# Patient Record
Sex: Female | Born: 1965 | Race: White | Hispanic: No | Marital: Married | Smoking: Former smoker
Health system: Southern US, Community
[De-identification: ages and names within clinical notes are randomized; demographics above are authoritative.]

## PROBLEM LIST (undated history)

## (undated) DIAGNOSIS — M797 Fibromyalgia: Secondary | ICD-10-CM

## (undated) DIAGNOSIS — I4891 Unspecified atrial fibrillation: Secondary | ICD-10-CM

## (undated) DIAGNOSIS — T7840XA Allergy, unspecified, initial encounter: Secondary | ICD-10-CM

## (undated) DIAGNOSIS — G90A Postural orthostatic tachycardia syndrome (POTS): Secondary | ICD-10-CM

## (undated) DIAGNOSIS — K5792 Diverticulitis of intestine, part unspecified, without perforation or abscess without bleeding: Secondary | ICD-10-CM

## (undated) DIAGNOSIS — I498 Other specified cardiac arrhythmias: Secondary | ICD-10-CM

## (undated) HISTORY — DX: Postural orthostatic tachycardia syndrome (POTS): G90.A

## (undated) HISTORY — PX: FRACTURE SURGERY: SHX138

## (undated) HISTORY — DX: Other specified cardiac arrhythmias: I49.8

## (undated) HISTORY — DX: Unspecified atrial fibrillation: I48.91

## (undated) HISTORY — PX: ABDOMINAL HYSTERECTOMY: SHX81

## (undated) HISTORY — DX: Fibromyalgia: M79.7

## (undated) HISTORY — PX: TUBAL LIGATION: SHX77

## (undated) HISTORY — DX: Allergy, unspecified, initial encounter: T78.40XA

## (undated) HISTORY — PX: FINGER SURGERY: SHX640

---

## 1998-09-23 ENCOUNTER — Emergency Department (HOSPITAL_COMMUNITY): Admission: EM | Admit: 1998-09-23 | Discharge: 1998-09-23 | Payer: Self-pay | Admitting: Emergency Medicine

## 1998-11-23 ENCOUNTER — Encounter: Payer: Self-pay | Admitting: Orthopedic Surgery

## 1998-11-23 ENCOUNTER — Ambulatory Visit (HOSPITAL_COMMUNITY): Admission: RE | Admit: 1998-11-23 | Discharge: 1998-11-23 | Payer: Self-pay | Admitting: Orthopedic Surgery

## 1999-01-29 ENCOUNTER — Other Ambulatory Visit: Admission: RE | Admit: 1999-01-29 | Discharge: 1999-01-29 | Payer: Self-pay | Admitting: *Deleted

## 1999-07-20 ENCOUNTER — Ambulatory Visit (HOSPITAL_COMMUNITY): Admission: RE | Admit: 1999-07-20 | Discharge: 1999-07-20 | Payer: Self-pay | Admitting: Orthopedic Surgery

## 1999-07-20 ENCOUNTER — Encounter: Payer: Self-pay | Admitting: Orthopedic Surgery

## 1999-09-27 ENCOUNTER — Encounter: Payer: Self-pay | Admitting: Obstetrics and Gynecology

## 1999-09-27 ENCOUNTER — Ambulatory Visit (HOSPITAL_COMMUNITY): Admission: AD | Admit: 1999-09-27 | Discharge: 1999-09-27 | Payer: Self-pay | Admitting: Obstetrics and Gynecology

## 1999-09-27 ENCOUNTER — Encounter (INDEPENDENT_AMBULATORY_CARE_PROVIDER_SITE_OTHER): Payer: Self-pay

## 2000-02-05 ENCOUNTER — Other Ambulatory Visit: Admission: RE | Admit: 2000-02-05 | Discharge: 2000-02-05 | Payer: Self-pay | Admitting: Obstetrics and Gynecology

## 2001-06-22 ENCOUNTER — Other Ambulatory Visit: Admission: RE | Admit: 2001-06-22 | Discharge: 2001-06-22 | Payer: Self-pay | Admitting: Obstetrics and Gynecology

## 2002-08-04 ENCOUNTER — Other Ambulatory Visit: Admission: RE | Admit: 2002-08-04 | Discharge: 2002-08-04 | Payer: Self-pay | Admitting: Obstetrics and Gynecology

## 2002-09-10 ENCOUNTER — Ambulatory Visit (HOSPITAL_COMMUNITY): Admission: RE | Admit: 2002-09-10 | Discharge: 2002-09-10 | Payer: Self-pay | Admitting: Obstetrics and Gynecology

## 2002-09-10 ENCOUNTER — Encounter (INDEPENDENT_AMBULATORY_CARE_PROVIDER_SITE_OTHER): Payer: Self-pay

## 2002-09-15 ENCOUNTER — Ambulatory Visit (HOSPITAL_COMMUNITY): Admission: RE | Admit: 2002-09-15 | Discharge: 2002-09-15 | Payer: Self-pay | Admitting: Obstetrics and Gynecology

## 2002-09-15 ENCOUNTER — Encounter: Payer: Self-pay | Admitting: Obstetrics and Gynecology

## 2006-04-16 ENCOUNTER — Encounter: Payer: Self-pay | Admitting: Emergency Medicine

## 2006-09-07 ENCOUNTER — Encounter (INDEPENDENT_AMBULATORY_CARE_PROVIDER_SITE_OTHER): Payer: Self-pay | Admitting: *Deleted

## 2006-09-08 ENCOUNTER — Ambulatory Visit (HOSPITAL_COMMUNITY): Admission: RE | Admit: 2006-09-08 | Discharge: 2006-09-10 | Payer: Self-pay | Admitting: Obstetrics and Gynecology

## 2006-10-31 ENCOUNTER — Encounter: Admission: RE | Admit: 2006-10-31 | Discharge: 2006-10-31 | Payer: Self-pay | Admitting: Obstetrics and Gynecology

## 2007-11-20 ENCOUNTER — Encounter (INDEPENDENT_AMBULATORY_CARE_PROVIDER_SITE_OTHER): Payer: Self-pay | Admitting: Obstetrics and Gynecology

## 2007-11-20 ENCOUNTER — Ambulatory Visit (HOSPITAL_COMMUNITY): Admission: RE | Admit: 2007-11-20 | Discharge: 2007-11-20 | Payer: Self-pay | Admitting: Obstetrics and Gynecology

## 2008-06-29 ENCOUNTER — Inpatient Hospital Stay (HOSPITAL_COMMUNITY): Admission: RE | Admit: 2008-06-29 | Discharge: 2008-07-01 | Payer: Self-pay | Admitting: Obstetrics and Gynecology

## 2008-06-29 ENCOUNTER — Encounter (INDEPENDENT_AMBULATORY_CARE_PROVIDER_SITE_OTHER): Payer: Self-pay | Admitting: Obstetrics and Gynecology

## 2011-04-02 ENCOUNTER — Other Ambulatory Visit: Payer: Self-pay | Admitting: Ophthalmology

## 2011-04-02 DIAGNOSIS — R51 Headache: Secondary | ICD-10-CM

## 2011-04-03 ENCOUNTER — Ambulatory Visit
Admission: RE | Admit: 2011-04-03 | Discharge: 2011-04-03 | Disposition: A | Discharge status: Home / Self Care | Payer: 59 | Source: Ambulatory Visit | Attending: Ophthalmology | Admitting: Ophthalmology

## 2011-04-03 DIAGNOSIS — R51 Headache: Secondary | ICD-10-CM

## 2011-04-03 MED ORDER — GADOBENATE DIMEGLUMINE 529 MG/ML IV SOLN
20.0000 mL | Freq: Once | INTRAVENOUS | Status: AC | PRN
  Administered 2011-04-03: 20 mL via INTRAVENOUS

## 2011-04-30 ENCOUNTER — Other Ambulatory Visit: Payer: Self-pay | Admitting: Neurology

## 2011-04-30 DIAGNOSIS — G932 Benign intracranial hypertension: Secondary | ICD-10-CM

## 2011-04-30 NOTE — Op Note (Signed)
NAME:  Christine [name], Christine [name]            ACCOUNT NO.:  [id number]   MEDICAL RECORD NO.:  [id number]          PATIENT TYPE:  INP   LOCATION:  9302                          FACILITY:  [facility]   PHYSICIAN:  Juluis Mire, M.D.   DATE OF BIRTH:  07/21/1966   DATE OF PROCEDURE:  06/29/2008  DATE OF DISCHARGE:                               OPERATIVE REPORT   ADMITTING DIAGNOSES:  1. Cystic enlargement of the left ovary, apparent serous cystadenoma.  2. Pelvic adhesions with associated pelvic pain.   POSTOPERATIVE DIAGNOSES:  1. Cystic enlargement of the left ovary, apparent serous cystadenoma.  2. Pelvic adhesions with associated pelvic pain.   OPERATIVE PROCEDURES:  1. Exploratory laparotomy.  2. Lysis of adhesions.  3. Bilateral salpingo-oophorectomy.  4. Cystoscopy.   SURGEON:  Juluis Mire, MD   ASSISTANT:  Zelphia Cairo, MD   ESTIMATED BLOOD LOSS:  200 mL.   PACKS AND DRAINS:  None.   INTRAOPERATIVE BLOOD PLACED:  None.   COMPLICATIONS:  None.   INDICATIONS:  As noted in the history and physical.   PROCEDURES NOTE:  The patient was taken to the OR and placed in supine  position.  After satisfactory level of general endotracheal anesthesia  was obtained, the abdomen was prepped out with Betadine and draped in  sterile field.  A Foley had been placed to straight drain.  A low  transverse skin incision was made with knife and carried through the  subcutaneous tissue.  Fascia was entered sharply and an incision in the  fascia was done laterally.  Fascia was taken off the muscle superiorly  and inferiorly.  Rectus muscles were separated midline.  Peritoneum was  entered sharply and incision in the peritoneum was extended both  superiorly and inferiorly.  It was noted that the sigmoid colon was  adherent to the anterior abdominal wall.  This was taken down using  sharp dissection with no apparent injury to the colon.  At this point in  time, we could see a cystically enlarged  left ovary.  The bowel was also  adherent to that.  This was sharply taken down.  During the procedure,  the cyst was entered.  Clear fluid was obtained.  The cyst lining was  smooth with no evidence of excrescences or solid areas.  At this point  in time, we went about trying to find the ureter.  We identified a  structure on the left side that appeared to be ureter and had been  disrupted.  At this point in time, we called in the urologist.  We went  ahead and decided to remove the right ovary.  At this point in time, the  right ovary was identified and elevated.  We made incision in the  peritoneum over the psoas muscle.  We developed a right retroperitoneal  space.  The ureter was easily identified.  The ovarian vasculature was  isolated above the ureter, clamped and cut, and the ovary and tube were  passed off the operative field and sent to Pathology.  The pedicles were  secured with a free tie of 0 Vicryl  and a suture ligature of 0 Vicryl.  We went back to the left side.  After evaluation, we decided that the  structure we felt as ureter was probably the round ligament.  We went up  a little higher on the peritoneal sidewall and did identify the ureter  at this point in time before the urologist came into place.  At this  point in time, we identified the ovarian vasculature above the ureter,  clamped, cut, and removed the left tube and ovary from the operative  field and it was sent to Pathology.  Pedicles were secured with a free  tie of 0 Vicryl and suture ligature of 0 Vicryl.  At this point in time,  Dr. Patsi Sears came in placed.  He identified and traced out the ureter,  felt it was uninvolved in the surgical procedure.  At this point, we  irrigated the pelvis, had good hemostasis, clear urine output.  The  patient had been given indigo carmine.  At this point in time, muscles  and peritoneum were closed with running suture of 3-0 Vicryl, fascia was  closed with running suture  of 0 PDS, and skin was closed with staples  and Steri-Strips.   The patient's legs were repositioned, and Foley was removed.  Cystoscope  was put in place.  Visualization of both ureteral orifices revealed  spilling of blue-tinged urine indicating no obstruction and there was no  bladder abnormalities.  Cystoscope was removed.  Foley was placed back  to straight drain.  At this point in time, the patient was extubated and  was transferred to recovery room in good condition.  Sponge,  instruments, and needle count was correct by circulating nurse x2.      Juluis Mire, M.D.  Electronically Signed     JSM/MEDQ  D:  06/30/2008  T:  06/30/2008  Job:  [id number]

## 2011-04-30 NOTE — Consult Note (Signed)
NAME:  BRITTYN, SALAZ            ACCOUNT NO.:  [id number]   MEDICAL RECORD NO.:  [id number]          PATIENT TYPE:  INP   LOCATION:  9302                          FACILITY:  [facility]   PHYSICIAN:  Sigmund I. Patsi Sears, M.D.DATE OF BIRTH:  1966/03/31   DATE OF CONSULTATION:  DATE OF DISCHARGE:                                 CONSULTATION   SUBJECTIVE:  Ms. Taflinger is a 45 year old female, who is  intraoperative TAH-BSO.  However, the left ureter could not be easily  identified.  Structures were identified and ligated during the  procedure, but the structure, thought possibly be ureter, is the round  ligament.  The question now is whether the ureter is in the inflammatory  mass around the left ovary.   PROCEDURE:  Dissection was identified retroperitoneally, until the  ureter was identified, and dissected down around the inflammatory mass.  It was not within the operative ovarian mass.   The patient will be given indigo carmine, cystoscope by Dr. Arelia Sneddon.  I  do not anticipate any further problems for Ms. Stawicki.  Please  reconsult as needed.   IMPRESSION:  Inflammatory mass to left pelvis.  Left ureter is  identified, dissected away from the mass.      Sigmund I. Patsi Sears, M.D.  Electronically Signed     SIT/MEDQ  D:  06/29/2008  T:  06/29/2008  Job:  [id number]   cc:   Juluis Mire, M.D.  Fax: [phone]

## 2011-04-30 NOTE — Op Note (Signed)
NAME:  Christine [name], Christine [name]            ACCOUNT NO.:  [id number]   MEDICAL RECORD NO.:  [id number]          PATIENT TYPE:  AMB   LOCATION:  SDC                           FACILITY:  [facility]   PHYSICIAN:  Juluis Mire, M.D.   DATE OF BIRTH:  11-26-1966   DATE OF PROCEDURE:  11/20/2007  DATE OF DISCHARGE:                               OPERATIVE REPORT   PREOPERATIVE DIAGNOSES:  Pelvic pain with cystic enlargement of the left  adnexa.   POSTOPERATIVE DIAGNOSES:  1. Pelvic pain with cystic enlargement of the left adnexa.  2. Extensive abdominal pelvic adhesions.   PROCEDURES:  Open laparoscopy, lysis of adhesions left ovarian  cystotomy.   SURGEON:  Juluis Mire, M.D.   ANESTHESIA:  General.   ESTIMATED BLOOD LOSS:  Minimal.   PACKS AND DRAINS:  None.   BLOOD REPLACED:  None.   COMPLICATIONS:  None.   INDICATIONS:  Dictated in history and physical.   DESCRIPTION OF PROCEDURE:  The patient was taken to the OR, placed in  supine position.  After a satisfactory level of general endotracheal  anesthesia was obtained, the patient was placed in the dorsal lithotomy  position using the Allen stirrups.  Dr. Logan Bores first performed  cystoscopy. After he was done, a Foley was placed to straight drain.  The abdomen and perineum were prepped out with Betadine and draped in a  sterile field. A subumbilical incision made with a knife and carried  through the subcutaneous tissue. The fascia was entered sharply and  incision in the fascia extended laterally. The muscles were separated.  We entered into the peritoneum with blunt pressure and open laparoscopic  trocar was put in place and secured.  The laparoscope was introduced.  She had omental adhesions to the anterior abdominal wall.  We were able  to put in a 5-mm trocar in the left lower quadrant after visualizing the  epigastric vessels.  At this point in time, the omental adhesions that  were in the periumbilical artery were taken down  using the gyrus. There  is no bowel encountered in this. We were then able to see the right  ovary which appeared normal.  A 5-mm trocar port was put in place in the  suprapubic area under direct visualization. The left ovary had  approximately a 5-6 cm simple-appearing cyst with smooth wall and the  descending sigmoid colon was adherent around this.  It was also adherent  to the anterior abdominal wall.  The cul-de-sac was otherwise clear, so  the colon came down and evidently stuck up to the anterior abdominal  wall therefore entrapping the left ovary. We could not safely get to the  ovary completely because of the colon. Therefore we made a cystotomy  incision in the cyst. We drained this and we did obtain a biopsy from  the wall that was sent to pathology. Also of note, we did obtain pelvic  washings.  We thoroughly irrigated the pelvis at this point. We had good  hemostasis. There was no evidence of injury to the intestine or bladder.  The abdomen was deflated of carbon  dioxide, all trocars removed.  The  subumbilical fascia was closed with figure-of-eight of #0 Vicryl, skin  with interrupted subcuticulars with 4-0 Vicryl.  The suprapubic incision  was closed with Dermabond. The Foley was removed.  The patient was taken  out of the dorsal lithotomy position once alert, extubated and  transferred to recovery in good condition.  Sponge, instrument and  needle count reported as correct by circulating nurse x2.      Juluis Mire, M.D.  Electronically Signed     JSM/MEDQ  D:  11/20/2007  T:  11/20/2007  Job:  [id number]

## 2011-04-30 NOTE — Discharge Summary (Signed)
NAME:  [name], Christine            ACCOUNT NO.:  [id number]   MEDICAL RECORD NO.:  [id number]          PATIENT TYPE:  INP   LOCATION:  9302                          FACILITY:  [facility]   PHYSICIAN:  Juluis Mire, M.D.   DATE OF BIRTH:  November 02, 1966   DATE OF ADMISSION:  06/29/2008  DATE OF DISCHARGE:  07/01/2008                               DISCHARGE SUMMARY   ADMITTING DIAGNOSES:  1. Cystic enlargement of the left ovary.  2. Abdominal and pelvic adhesions.   DISCHARGE DIAGNOSES:  1. Cystic enlargement of the left ovary.  2. Abdominal and pelvic adhesions.   OPERATIVE PROCEDURE:  Exploratory surgery with bilateral salpingo-  oophorectomy.   Urological consult determined course of ureter.  For complete history  and physical, please see dictated note.   COURSE IN THE HOSPITAL:  The patient underwent exploratory surgery, had  extensive abdominal and pelvic adhesions.  After lysis of adhesions, we  were able to form bilateral salpingo-oophorectomy.  We did get an  intraoperative consult with Dr. Patsi Sears who evaluated the ureter and  felt that was undamaged.  Subsequent cystoscopy confirmed that.  Postop,  the patient did well.  Postop hemoglobin was 13.1.  Discharged home on  her second postop day.  At that time was afebrile, stable vital signs.  She was tolerating a regular diet and ambulating without difficulty.  She was voiding without difficulty.  In terms of exam, she was afebrile,  stable vital signs.  Her incision was clear.  We did leave the staples  in place.  Pathology from both ovaries was unremarkable.  In terms of  exam, abdomen was soft and nontender.  Bowel sounds were active.  She  was passing flatus.  Voiding without difficulty.   In terms of complications, none were encountered during her stay in the  hospital.  The patient was discharged to home in stable condition.   DISPOSITION:  Routine postop instructions were given.  She is to avoid  heavy lifting, vaginal  entrance, or driving a car.  She will watch for  signs of infection, nausea, vomiting, or increased abdominal pain.  Also, instructed signs of deep venous thrombosis or pulmonary embolus.  Discharged home on Tylox as needed for pain.  Follow up early next week  to remove staples.      Juluis Mire, M.D.  Electronically Signed     JSM/MEDQ  D:  07/01/2008  T:  07/01/2008  Job:  [id number]

## 2011-04-30 NOTE — H&P (Signed)
NAME:  Christine [name], Christine [name] NO.:  [id number]   MEDICAL RECORD NO.:  [id number]          PATIENT TYPE:  AMB   LOCATION:  SDC                           FACILITY:  [facility]   PHYSICIAN:  Juluis Mire, M.D.   DATE OF BIRTH:  11/06/1966   DATE OF ADMISSION:  DATE OF DISCHARGE:                              HISTORY & PHYSICAL   HISTORY OF PRESENT ILLNESS:  Patient is a 45 year old gravida 2, para 1,  abortive 1 female who presents for a laparoscopic evaluation.   Patient in 2007 had a total abdominal hysterectomy due to pelvic  adhesions.  She continued to experience pelvic pain and discomfort,  particularly with intercourse.  She has had an ultrasound evaluated,  revealed persistent cystic enlargement of the left ovary.  She is now  going to proceed with laparoscopic evaluation of the pelvis to determine  if there are any adhesions and management of the persistent left ovarian  cyst.  Of note, CA-125 was negative.   ALLERGIES:  SHE IS ALLERGIC TO PENICILLIN AND SULFA.   MEDICATIONS:  Imitrex, simvastatin, and Flexeril.   PAST MEDICAL HISTORY:  Usual childhood diseases.  Does have a history of  elevated cholesterol.   PAST SURGICAL HISTORY:  She had a previous laparoscopic evaluation,  lysis of adhesions, and tubal.  She had a previous hysterectomy.  She  has had 1 prior cesarean section, 1 vaginal delivery.   FAMILY HISTORY:  Noncontributory.   SOCIAL HISTORY:  Reveals no tobacco or alcohol use.   REVIEW OF SYSTEMS:  Noncontributory.   PHYSICAL EXAM:  Patient is afebrile with stable vital signs.  HEENT EXAM:  Patient normocephalic.  Pupils:  Equal, round, and reactive  to light and accommodation.  Extraocular movements were intact.  Sclerae  and conjunctivae are clear.  Oropharynx:  Clear.  Neck without thyromegaly.  BREASTS:  No discreet masses.  LUNGS:  Clear.  CARDIOVASCULAR SYSTEM:  Regular rhythm and rate without murmurs or  gallops.  Her abdominal exam is  benign.  Well-healed low transverse incision.  On pelvic, normal external genitalia.  Vaginal mucosa is clear.  Cuff  intact.  Left-sided tenderness and fullness.  EXTREMITIES:  Trace edema.  Neurologic exam is grossly within normal limits.   IMPRESSION:  1. Pelvic pain and dyspareunia, possibly secondary to adhesions.  2. Persistent left ovarian cyst.   PLAN:  The patient will undergo open laparoscopy with lysis of  adhesions, possible removal of left ovary or left cyst.  Risks of  surgery have been discussed including the risk of infection, the risk of  hemorrhage, could require transfusion with the risk of AIDS or  Hepatitis, the risk of injury to adjacent organs including bladder,  bowel, or ureter, could require further exploratory surgery, risk of  deep vein thrombosis and pulmonary embolus.  Patient expressed  understanding.      Juluis Mire, M.D.  Electronically Signed     JSM/MEDQ  D:  11/20/2007  T:  11/20/2007  Job:  [id number]

## 2011-04-30 NOTE — H&P (Signed)
NAME:  Christine [name], Christine [name] NO.:  [id number]   MEDICAL RECORD NO.:  [id number]          PATIENT TYPE:  AMB   LOCATION:  SDC                           FACILITY:  [facility]   PHYSICIAN:  Juluis Mire, M.D.   DATE OF BIRTH:  05/31/1966   DATE OF ADMISSION:  DATE OF DISCHARGE:                              HISTORY & PHYSICAL   The patient is a 45 year old gravida 2, para 1, abortus 1 female, who  presents for exploratory surgery, lysis of adhesions, and bilateral  salpingo-oophorectomy.   In relation to the present admission, the patient has a history of  pelvic adhesions.  She underwent a total abdominal hysterectomy in 2007.  Extensive adhesions were noted at that time.  After surgery, she began  experiencing recurrent pelvic pain and pain with intercourse.  Ultrasound had revealed persistent cystic enlargement of the left  adnexa.  She had undergone GI as well as urological workup with negative  findings.  Subsequently in December 2008, she underwent laparoscopy.  The sigmoid colon was densely adherent to the anterior abdominal wall.  We could see the cystic enlargement of the left ovary, but could not get  to it due to the colon.  We did do a partial cystectomy.  Pathology was  consistent with a benign serous cystadenoma.  However, the cyst has  reoccurred on followup ultrasound, and pain has become progressively  worse.  Her last ultrasound showed a simple cyst measuring 7 x 2.5 cm.  After discussion of the options, the patient wishes to proceed with  again exploratory surgery.  We will have to take down adhesions and  remove the left ovary.  She wants the right ovary removed also at this  time.  We have discussed leaving the right ovary, but she is adamant  about removing it.  We discussed that this would put her on menopause  and could require estrogen replacement therapy for management of hot  flashes.  The risk and benefits of hormones have been discussed.   ALLERGIES:  In terms of allergies, she is allergic to PENICILLIN and  SULFA drugs.   MEDICATIONS:  She takes Atarax 2 at night, Vi-Uril as needed, Bentyl as  needed, Flexeril 2 a day, Maxalt as needed, Phenergan as needed,  ketoconazole as needed, MetroGel once a day, and Lyrica three times a  day.   PAST MEDICAL HISTORY:  She has a history of migraine headaches, which  are being actively managed, as well as irritable bowel syndrome.  Her  family doctor is Dr. Sherryll Burger.   PAST SURGICAL HISTORY:  She has had a C-section.  She has had two hand  surgeries.  Tubal ligation.  Total abdominal hysterectomy as noted.  Laparoscopy as noted.   FAMILY HISTORY:  Noncontributory.   SOCIAL HISTORY:  Reveals no tobacco or alcohol use.   REVIEW OF SYSTEMS:  Noncontributory.   PHYSICAL EXAMINATION:  GENERAL:  The patient is afebrile.  VITAL SIGNS:  Stable.  HEENT:  The patient is normocephalic.  Pupils equal, round, and reactive  to light and accommodation.  Extraocular movements are intact.  Sclerae  and conjunctivae are clear.  Oropharynx clear.  NECK:  Without thyromegaly.  BREASTS:  No discrete masses.  LUNGS:  Clear.  CARDIAC:  Regular rate without murmurs or gallops.  ABDOMEN:  Benign.  No mass, organomegaly, or tenderness.  Well-healed  low-transverse incision.  PELVIC:  Normal external genitalia.  Vaginal mucosa is clear.  Cuff  intact.  Left-sided fullness.  Right side adnexa is unremarkable.  EXTREMITIES:  Trace edema.  NEURO:  Grossly within limits.   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   PLAN:  The patient will undergo exploratory surgery with bilateral  salpingo-oophorectomy.  It is noted that the risks of surgery were  explained including the risk of infection.  Risk of hemorrhage could  require transfusion with the risk of AIDS or hepatitis.  Risk of injury  to adjacent organs including bladder, bowel, or ureters that could  require further  exploratory surgery.  Risk of deep venous thrombosis and  pulmonary emboli.  She has been on a bowel prep.  She understands the  potential risks and complications of the surgery.      Juluis Mire, M.D.  Electronically Signed     JSM/MEDQ  D:  06/29/2008  T:  06/29/2008  Job:  [id number]

## 2011-05-03 ENCOUNTER — Ambulatory Visit
Admission: RE | Admit: 2011-05-03 | Discharge: 2011-05-03 | Disposition: A | Discharge status: Home / Self Care | Payer: 59 | Source: Ambulatory Visit | Attending: Neurology | Admitting: Neurology

## 2011-05-03 DIAGNOSIS — G932 Benign intracranial hypertension: Secondary | ICD-10-CM

## 2011-05-03 NOTE — Discharge Summary (Signed)
Christine [name], Christine [name]            ACCOUNT NO.:  [id number]   MEDICAL RECORD NO.:  [id number]          PATIENT TYPE:  INP   LOCATION:  9316                          FACILITY:  [facility]   PHYSICIAN:  Juluis Mire, M.D.   DATE OF BIRTH:  05-18-1966   DATE OF ADMISSION:  09/08/2006  DATE OF DISCHARGE:  09/10/2006                                 DISCHARGE SUMMARY   ADMITTING DIAGNOSIS:  Pelvic adhesions.   POSTOPERATIVE DIAGNOSIS:  Pelvic adhesions.   OPERATIVE PROCEDURE:  1. Exploratory laparoscopy.  2. Lysis of adhesions.  3. Total abdominal hysterectomy.   For complete history and physical, please see dictated note.   COURSE IN THE HOSPITAL:  The patient underwent the above-noted surgery.  Pathology is still pending.  Her postop hemoglobin was 12.  She remained  stable throughout her postoperative management and completely afebrile.  She  is discharged on her second postop day.  At that time, she was afebrile with  stable vital signs.  She was tolerating a regular diet and was ambulating  without difficulty.  She was passing flatus and had normal bladder function.  She had no active vaginal bleeding and her low-transverse incision was  intact.   In terms of complications, none were encountered during stay in the  hospital.  The patient was discharged home in stable condition.   DISPOSITION:  The patient discharged home on Tylox as needed for pain.  She  is going to complete a course of ciprofloxacin for a developing sinusitis.  In terms of activity, she is to avoid heavy lifting, vaginal __________, or  driving a car.  She is to watch for signs of infection, nausea, vomiting,  increasing in abdominal pain, active vaginal bleeding, or signs of  incisional change.  Followup in the office will be on Monday to remove  staples.      Juluis Mire, M.D.  Electronically Signed     JSM/MEDQ  D:  09/10/2006  T:  09/11/2006  Job:  [id number]

## 2011-05-03 NOTE — Op Note (Signed)
NAME:  Christine [name], Christine [name]                      ACCOUNT NO.:  [id number]   MEDICAL RECORD NO.:  [id number]                   PATIENT TYPE:  AMB   LOCATION:  SDC                                  FACILITY:  [facility]   PHYSICIAN:  Juluis Mire, M.D.                DATE OF BIRTH:  06-09-1966   DATE OF PROCEDURE:  09/10/2002  DATE OF DISCHARGE:                                 OPERATIVE REPORT   PREOPERATIVE DIAGNOSES:  1. Abnormal uterine bleeding.  2. Pelvic pain.  3. Dyspareunia.  4. Desires sterility.   POSTOPERATIVE DIAGNOSES:  1. Abnormal uterine bleeding.  2. Pelvic pain.  3. Dyspareunia.  4. Desires sterility.  5. Extensive pelvic adhesions.   OPERATIVE PROCEDURE:  1. Dilatation and curettage.  2. Laparoscopy with lysis of adhesions.  3. Bilateral tubal fulguration.   SURGEON:  Juluis Mire, M.D.   ANESTHESIA:  General endotracheal.   ESTIMATED BLOOD LOSS:  Minimal.   PACKS AND DRAINS:  None.   INTRAOPERATIVE BLOOD PLACED:  None.   COMPLICATIONS:  None.   INDICATIONS:  Dictated in history and physical.   PROCEDURE AS FOLLOWS:  The patient was taken to the OR and placed in supine  position.  After a satisfactory level of general endotracheal anesthesia was  obtained, the patient was placed in dorsal lithotomy position using the  Allen stirrups.  At this point in time the abdomen, perineum, and vagina  were prepped out with Betadine.  The patient was then draped out for  hysteroscopy.  A speculum was then placed in the vaginal vault.  Cervix was  very high in the vaginal vault.  We were able to eventually secure it with a  single tooth tenaculum.  Uterus sounded to approximately 10 cm.  The cervix  was serially dilated to a size 35 Pratt dilator.  Due to the extensive  upward retraction of the cervix, we could not get the hysteroscope into the  uterine cavity at all.  Therefore, we went ahead and just did intrauterine  curettings.  These were sent for pathologic  review.  Minimal tissue was  obtained.  At this point in time the Hulka tenaculum was put in place.  The  single tooth tenaculum were then removed.  Bladder was emptied with in-and-  out catheterization.   The patient was made ready for laparoscopy.  Subumbilical incision made with  the knife.  The Veress needle was introduced into the abdominal cavity.  Abdomen was insufflated with approximately 3 L of carbon dioxide.  The  operating laparoscope was then introduced.  There was no evidence of injury  to adjacent organs.  A 5 mm trocar was put in place under direct  visualization in the suprapubic area.  Visualization revealed extensive  pelvic adhesions.  She had omentum stacked at the fundal area.  The uterus  was completely adherent to the anterior abdominal wall with a broad  adhesion.  Right tube and ovary were unremarkable.  The uterus was markedly  deviated to the left.  The left tube and ovary encased in adhesions.  Using  the bipolar and scissors we were able to free the omentum down.  We then  dissected the left tube somewhat free and we freed out adhesions from the  sigmoid colon to the posterior aspect of the uterus.  There was no evidence  of injury to adjacent organs.  The right tube was identified and cauterized  for 2 cm.  Coagulation was continued until resistance read 0.  The same  segment of tube was recoagulated.  It was difficult to do the left tube due  to the adhesions but we were able to eventually coagulate approximately a 2  cm segment of tube.  We continued coagulation until resistance read 0.  We  then recoagulated the same segment of tube.  At this point in time both  tubes, I believe were adequately coagulated.  There was some question about  the left bowel adhesions were down.  There was no active bleeding.  The  uterus was still markedly adhered to the anterior abdominal wall and  deviated to the left.  At this point in time the abdomen was deflated of its   carbon dioxide, all trocars removed.  The subumbilical incision was closed  with interrupted subcuticulars of 4-0 Vicryl.  The suprapubic incision was  closed with Steri-Strips.  The Hulka tenaculum was then removed.  The  patient taken out of the dorsal lithotomy position.  Once alert and  extubated, transferred to recovery room in good condition.  Sponge, needle,  and instrument count reported as correct by circulating nurse.                                               Juluis Mire, M.D.    JSM/MEDQ  D:  09/10/2002  T:  09/10/2002  Job:  [id number]

## 2011-05-03 NOTE — H&P (Signed)
NAME:  Christine [name], Christine [name] NO.:  [id number]   MEDICAL RECORD NO.:  [id number]          PATIENT TYPE:  INP   LOCATION:  9316                          FACILITY:  [facility]   PHYSICIAN:  Juluis Mire, M.D.   DATE OF BIRTH:  03/15/1966   DATE OF ADMISSION:  09/08/2006  DATE OF DISCHARGE:                                HISTORY & PHYSICAL   The patient is a 45 year old, gravida 2, para 1, abortus 1, married female  who presents for a total abdominal hysterectomy.   The patient reports she is having increasing problems with pain and  discomfort. She has pain with intercourse as well as vaginal and rectal  pain. It was noted at that time of her tubal that the uterus was densely  adherent to the anterior abdominal wall felt to be secondary to a prior  cesarean section. Her cycles 21-35 days. She does have increasing flow and  dysmenorrhea. Ultrasound evaluation basically revealed a markedly  anaphylaxed uterus, the ovaries were unremarkable. After discussion of her  options, the patient now presents for total abdominal hysterectomy.   ALLERGIES:  She is allergic to PENICILLIN and SULFA.   MEDICATIONS:  Imitrex, Simvastatin and Flexeril.   PAST MEDICAL HISTORY:  She has had the usual childhood diseases without  significant sequelae. She does have a history of elevated cholesterol being  managed as noted.   PAST SURGICAL HISTORY:  Tubal. She has had one prior cesarean section and  one vaginal delivery.   FAMILY HISTORY:  Noncontributory.   SOCIAL HISTORY:  No tobacco or alcohol use.   REVIEW OF SYSTEMS:  Noncontributory.   PHYSICAL EXAMINATION:  VITAL SIGNS:  The patient is afebrile with stable  vital signs.  HEENT:  The patient is normocephalic. Pupils equal round and reactive to  light and accommodation. Extraocular movements intact. Sclera and  conjunctiva are clear. Oropharynx clear.  NECK:  Without thyromegaly.  BREASTS:  No discreet masses.  LUNGS:  Clear.  CARDIAC:  Regular rhythm and rate without murmurs or gallops.  ABDOMEN:  Benign. No mass, organomegaly or tenderness. Well-healed low  transverse incision.  PELVIC:  Normal external genitalia. Vaginal mucosa is clear. The cervix is  difficult to assess due to the marked anterior position of the uterus. The  uterus feels to be normal size and shape, moderately fixed. Adnexa  unremarkable. Rectovaginal exam is clear.  EXTREMITIES:  Trace edema.  NEUROLOGIC:  Grossly within normal limits.   IMPRESSION:  Abnormal uterine bleeding and pelvic pain secondary to pelvic  adhesions.   PLAN:  The patient will undergo exploratory surgery with total abdominal  hysterectomy. The ovaries will be visualized and left in place if okay. The  risks of surgery have been discussed including the risk of infection. The  risk of hemorrhage that could require transfusion, the risk of AIDS or  hepatitis. The risk of injury to adjacent organs including bladder, bowel or  ureters that could require further exploratory surgery. There is a risk of  deep venous thrombosis and pulmonary embolus. The patient expressed an  understanding of the indications and risks.  Juluis Mire, M.D.  Electronically Signed     JSM/MEDQ  D:  09/08/2006  T:  09/09/2006  Job:  [id number]

## 2011-05-03 NOTE — Op Note (Signed)
NAME:  Christine [name], Christine [name]            ACCOUNT NO.:  [id number]   MEDICAL RECORD NO.:  [id number]          PATIENT TYPE:  INP   LOCATION:  9316                          FACILITY:  [facility]   PHYSICIAN:  Juluis Mire, M.D.   DATE OF BIRTH:  1966/10/02   DATE OF PROCEDURE:  09/08/2006  DATE OF DISCHARGE:                                 OPERATIVE REPORT   PREOPERATIVE DIAGNOSIS:  Pelvic adhesions.   POSTOPERATIVE DIAGNOSIS:  Pelvic adhesions.   OPERATIVE PROCEDURE:  1. Exploratory laparotomy.  2. Lysis of adhesions with total abdominal hysterectomy.   SURGEON:  Juluis Mire, MD.   ASSISTANTFreddy Finner, MD.   ANESTHESIA:  General endotracheal.   ESTIMATED BLOOD LOSS:  200 to 400 ml.   PACKS AND DRAINS:  None.   INTRAOPERATIVE BLOOD REPLACEMENT COMPLICATIONS:  None.   INDICATIONS:  As dictated in the History of Present Illness.   PROCEDURE:  The patient was taken to the OR and placed in the supine  position.  After a satisfactory level of general endotracheal anesthesia was  obtained, the perineum and vagina were prepped out with Betadine, a Foley  was placed to straight drain.  The patient then draped in a sterile field.  A low transverse skin incision was made with a knife and carried through the  subcutaneous tissue.  The fascia was identified, entered sharply, the  incision in the fascia extended laterally.  The fascia was taken off the  muscle superiorly and inferiorly.  The rectus muscles were separated in the  midline.  We were able to enter the peritoneum and extend the incision in  the peritoneum upward.  The uterus was densely adherent, particularly on the  left fundal side to the anterior abdominal wall.  Using sharp dissection, we  were eventually able to free up the uterus from its fascial adhesions at  this point in time.  The uterus was somewhat elevated through the incision.  We first went to the right side.  We placed Kelly clamps across the utero-  ovarian  pedicle.  We then took down the round ligament by suture ligation.  We developed the right utero-ovarian pedicle, clamped, cut, and doubly  ligated, first with a free tie of #0 Vicryl and then a suture ligature of #0  Vicryl.  We then developed a bladder flap.  The uterine vessels were  skeletonized on the right side.  They were clamped, cut, and suture ligated  with #0 Vicryl.  We then went to the left side.  After breaking down some of  the adhesions, we identified the left adnexa.  It looks like the round  ligament had already been taken down with the adhesive process.  We isolated  the left utero-ovarian pedicle, clamped, cut, and doubly ligated, first with  a free tie of #0 Vicryl, then a suture ligature of #0 Vicryl.  We had good  hemostasis.  We further developed the bladder flap.  Now, the left uterine  vessels were skeletonized, clamped, cut, and suture ligated with #0 Vicryl.  Using the clamp, cut, and tie technique with suture  ligatures of #0 Vicryl,  the parametrium was serially separated from the sides of the uterus, vaginal  angles were identified, clamped, and cut, and the uterus was passed off the  operative field, the pedicles secured with a suture ligature of #0 Vicryl,  the intervening vaginal mucosa was closed with interrupted figure-of-eights  of #0 Vicryl.  Some bleeding was noted and brought under control with figure-  of-eights of #0 Vicryl and the Bovie.  Urine output remained clear and  adequate.  We identified both ovarian pedicles, they were hemostatically  intact, the vaginal cuff was hemostatically intact, the appendix was  visualized and noted to be normal.  We thoroughly irrigated the pelvis, we  had excellent hemostasis.  At this point in time, the muscle peritoneum was  closed with a running suture of #3-0 Vicryl, the fascia was closed with a  running suture of #0 PDS, the skin was closed with staples and Steri-Strips.  Sponge, instrument, and needle counts  reported as correct by the circulating  nurse x2.  The Foley catheter remained clear at the time of closure.  The  patient tolerated the procedure well and was returned to the recovery room  in good condition.      Juluis Mire, M.D.  Electronically Signed     JSM/MEDQ  D:  09/08/2006  T:  09/09/2006  Job:  [id number]

## 2011-05-03 NOTE — H&P (Signed)
NAME:  Christine [name], Christine [name]                      ACCOUNT NO.:  [id number]   MEDICAL RECORD NO.:  [id number]                   PATIENT TYPE:  AMB   LOCATION:  SDC                                  [facility]ACILITY:  [facility]   PHYSICIAN:  Juluis Mire, M.D.                DATE OF BIRTH:  Dec 28, 1965   DATE OF ADMISSION:  09/10/2002  DATE OF DISCHARGE:                                HISTORY & PHYSICAL   HISTORY OF PRESENT ILLNESS:  The patient is a 45 year old gravida 2, para 1,  divorced white female presents for diagnostic laparoscopy standby, bilateral  tubal ligation, as well as hysteroscopic evaluation.   In relation to the present admission, the patient is on birth control pills  at the present time.  She continues to have trouble with significant pelvic  discomfort.  She describes left lower quadrant pain, mainly on the left, but  does include both sides, particularly with her periods.  Associated with  this has been worsening limiting deep dyspareunia.  She has been on birth  control pills without any improvement.  She also has periods that remain  relatively heavy.  She has had previous ultrasound evaluation which was  negative.  Finally, she is desirous of permanent sterilization.  Alternative  forms of birth control have been discussed.  The potential irreversibility  of sterilization explained.  The failure rate of 1:200 is quoted.   ALLERGIES:  PENICILLIN and SULFA.   MEDICATIONS:  Birth control pills.   PAST MEDICAL HISTORY:  Usual childhood diseases.  No significant sequela.   PAST SURGICAL HISTORY:  No previous surgical history.   PAST OBSTETRICAL HISTORY:  1. One spontaneous abortion.  2. One vaginal delivery.   FAMILY HISTORY:  Noncontributory.   SOCIAL HISTORY:  No tobacco or alcohol use.   REVIEW OF SYMPTOMS:  Noncontributory.   PHYSICAL EXAMINATION:  VITAL SIGNS:  The patient is afebrile with stable  vital signs.  HEENT:  Pupils equal, round, reactive to light and  accommodation.  Extraocular movements were intact.  Sclerae and conjunctivae clear.  Oropharynx clear.  NECK:  Without thyromegaly.  BREASTS:  No discrete masses.  LUNGS:  Clear.  CARDIOVASCULAR:  Regular rate and rhythm without murmurs or gallops.  ABDOMEN:  Benign, no masses, organomegaly, or tenderness.  PELVIC:  Normal external genitalia.  Vaginal mucosa is clear.  Cervix is  unremarkable.  Uterus is normal size, shape, and contour.  Adnexa are free  of masses or tenderness.  EXTREMITIES:  Trace edema.  NEUROLOGIC:  Grossly within normal limits.   IMPRESSION:  1. Desire sterility.  2. Menorrhagia, dysmenorrhea, and dyspareunia, rule out endometriosis.   PLAN:  The patient will undergo the above noted surgery.  The risks of  surgery have been discussed, including the risk of infection, the risk of  vascular injury that could lead to hemorrhage requiring exploratory surgery  or possible transfusion, risk of injury to adjacent  organs could require  further exploratory surgery, the risk of deep vein thrombosis and pulmonary  embolism.  The patient voiced understanding.                                                  Juluis Mire, M.D.    JSM/MEDQ  D:  09/10/2002  T:  09/10/2002  Job:  [id number]

## 2011-05-09 ENCOUNTER — Other Ambulatory Visit: Payer: Self-pay | Admitting: Neurology

## 2011-05-09 DIAGNOSIS — G43909 Migraine, unspecified, not intractable, without status migrainosus: Secondary | ICD-10-CM

## 2011-05-09 DIAGNOSIS — G932 Benign intracranial hypertension: Secondary | ICD-10-CM

## 2011-05-14 ENCOUNTER — Ambulatory Visit
Admission: RE | Admit: 2011-05-14 | Discharge: 2011-05-14 | Disposition: A | Discharge status: Home / Self Care | Payer: 59 | Source: Ambulatory Visit | Attending: Neurology | Admitting: Neurology

## 2011-05-14 DIAGNOSIS — G932 Benign intracranial hypertension: Secondary | ICD-10-CM

## 2011-05-14 DIAGNOSIS — G43909 Migraine, unspecified, not intractable, without status migrainosus: Secondary | ICD-10-CM

## 2011-05-15 ENCOUNTER — Other Ambulatory Visit: Payer: 59

## 2011-09-12 LAB — CBC
MCHC: 34.2
MCV: 92.1
Platelets: 314
RBC: 4.72
WBC: 7.8

## 2011-09-13 LAB — CBC
MCHC: 34.3
MCV: 93
RBC: 4.11

## 2011-09-23 LAB — CBC
MCV: 92
Platelets: 332
RDW: 13.3
WBC: 6.2

## 2011-10-03 ENCOUNTER — Other Ambulatory Visit: Payer: Self-pay | Admitting: Gastroenterology

## 2011-12-16 ENCOUNTER — Ambulatory Visit (INDEPENDENT_AMBULATORY_CARE_PROVIDER_SITE_OTHER): Payer: 59

## 2011-12-16 DIAGNOSIS — R05 Cough: Secondary | ICD-10-CM

## 2011-12-16 DIAGNOSIS — R0602 Shortness of breath: Secondary | ICD-10-CM

## 2011-12-16 DIAGNOSIS — R059 Cough, unspecified: Secondary | ICD-10-CM

## 2011-12-16 DIAGNOSIS — J40 Bronchitis, not specified as acute or chronic: Secondary | ICD-10-CM

## 2012-07-28 ENCOUNTER — Ambulatory Visit (INDEPENDENT_AMBULATORY_CARE_PROVIDER_SITE_OTHER): Payer: 59 | Admitting: Family Medicine

## 2012-07-28 VITALS — BP 160/101 | HR 88 | Temp 98.2°F | Resp 16 | Ht 63.0 in | Wt 289.0 lb

## 2012-07-28 DIAGNOSIS — M62838 Other muscle spasm: Secondary | ICD-10-CM

## 2012-07-28 MED ORDER — METHOCARBAMOL 500 MG PO TABS: 500.0000 mg | ORAL_TABLET | Freq: Three times a day (TID) | ORAL | Status: AC

## 2012-07-28 MED ORDER — HYDROCODONE-ACETAMINOPHEN 5-500 MG PO TABS: 1.0000 | ORAL_TABLET | Freq: Three times a day (TID) | ORAL | Status: AC | PRN

## 2012-07-28 NOTE — Patient Instructions (Addendum)
Use heat and massage to help with your neck.   Take the muscle relaxer to help with the neck spasm. Take the Vicodin if you need it for pain. Continue the Ibuprofen like you have been doing for inflammation.    If you're not having any relief in next 7 - 10 days, come back and see [place].

## 2012-07-29 NOTE — Progress Notes (Signed)
Patient ID: Christine [name], female   DOB: [birth year], 46 y.o.   MRN: [id number] Christine [name] is a 46 y.o. female who presents to [facility] today for neck pain:  1.  Neck pain:  Patient lifting computer this AM around 11 oclock.  When she put on ground she noted a sudden, sharp pain in Left side of neck and shoulder.  Had pinched nerve several years ago on Right side, similar pain.  Pain is sharp, stabbing and 7/10 in nature.  Worse when she turns head or looks up/down.  Also pain when she moves her Left arm.  Non-radiating, no numbness/tingling/weakness in either arm.     The following portions of the patient's history were reviewed and updated as appropriate: allergies, current medications, past medical history, family and social history, and problem list.  Patient is a nonsmoker.  No past medical history on file.  ROS as above otherwise neg. No Chest pain, palpitations, SOB, Fever, Chills, Abd pain, N/V/D.  Medications reviewed. Current Outpatient Prescriptions  Medication Sig Dispense Refill  . cholecalciferol (VITAMIN D) 1000 UNITS tablet Take 1,000 Units by mouth daily.      . cyclobenzaprine (FLEXERIL) 10 MG tablet Take 10 mg by mouth 3 (three) times daily as needed.      . eletriptan (RELPAX) 40 MG tablet One tablet by mouth at onset of headache. May repeat in 2 hours if headache persists or recurs. may repeat in 2 hours if necessary      . fish oil-omega-3 fatty acids 1000 MG capsule Take 2 g by mouth daily.      . verapamil (CALAN) 80 MG tablet Take 80 mg by mouth 3 (three) times daily.      Marland Kitchen HYDROcodone-acetaminophen (VICODIN) 5-500 MG per tablet Take 1 tablet by mouth every 8 (eight) hours as needed for pain.  20 tablet  0  . methocarbamol (ROBAXIN) 500 MG tablet Take 1 tablet (500 mg total) by mouth 3 (three) times daily.  20 tablet  0    Exam:  BP 160/101  Pulse 88  Temp 98.2 F (36.8 C) (Oral)  Resp 16  Ht 5\' 3"  (1.6 m)  Wt 289 lb (131.09 kg)  BMI 51.19 kg/m2  SpO2  100%  LMP 04/02/2006 Gen: Well NAD.  Patient does reveal some atrophy of deltoid on Left.   HEENT: EOMI,  MMM Neck:  Palpable spasm noted throughout trapezius on Left side.  Does not radiate into paraspinal muscles of neck.  Non tender of Left shoulder.  Tender to palpation throughout trapezius.  Palpable spasm noted to mid-thoracic region on Left side.  Extension to about 30 degrees, full flexion.  25 degrees rotation to Left, 50 degree rotation to Right.  No radiation of pain or neurological symptoms in arms with these maneuvers.   MSK:  Strength 4/5 Left upper extermity, 5/5 Left handgrip strength.  Strength limited by pain on Left side.  Right side upper and lower extremities 5/5 throughout.  Pulses 2+ radial and ulnar bilaterally.  Sensation 5/5 BL upper and lower extremities.  DTRs +2 brachioradialis.   Exts: Non edematous BL  LE, warm and well perfused.   No results found for this or any previous visit (from the past 72 hour(s)).  1.  Cervical neck spasm:  Plan to treat with muscle relaxer and short-term course of pain reliever.  She takes Flexeril chronically and feels this no longer helps.  Therefore instructed her to STOP her Flexeril and start a new medication (  Robaxin).  Gave warnings regarding mixing muscle relaxers and risk of respiratory depression, especially in light of narcotic use as well.  Patient also takes Tramadol chronically (not on medication list?) for fibromyalgia and states that this is not touching her current pain, thus short-term prescription for Vicodin.  FU in 1 week if no relief or sooner if worsening.   2.  Elevated blood pressure:  Patient without history of this.  Recommended to return in 1-2 weeks for blood pressure recheck.  Likely elevated secondary to pain today.

## 2013-02-21 ENCOUNTER — Ambulatory Visit (INDEPENDENT_AMBULATORY_CARE_PROVIDER_SITE_OTHER): Payer: 59 | Admitting: Physician Assistant

## 2013-02-21 VITALS — BP 150/94 | HR 74 | Temp 97.8°F | Resp 16 | Ht 63.5 in | Wt 279.0 lb

## 2013-02-21 DIAGNOSIS — R05 Cough: Secondary | ICD-10-CM

## 2013-02-21 DIAGNOSIS — G43909 Migraine, unspecified, not intractable, without status migrainosus: Secondary | ICD-10-CM | POA: Insufficient documentation

## 2013-02-21 DIAGNOSIS — J029 Acute pharyngitis, unspecified: Secondary | ICD-10-CM

## 2013-02-21 DIAGNOSIS — J309 Allergic rhinitis, unspecified: Secondary | ICD-10-CM

## 2013-02-21 LAB — POCT CBC
Granulocyte percent: 60 %G (ref 37–80)
Hemoglobin: 14.7 g/dL (ref 12.2–16.2)
MCH, POC: 29.3 pg (ref 27–31.2)
MPV: 7.5 fL (ref 0–99.8)
POC Granulocyte: 2.6 (ref 2–6.9)
POC MID %: 11.1 %M (ref 0–12)
RBC: 5.01 M/uL (ref 4.04–5.48)
WBC: 4.4 10*3/uL — AB (ref 4.6–10.2)

## 2013-02-21 LAB — POCT RAPID STREP A (OFFICE): Rapid Strep A Screen: NEGATIVE

## 2013-02-21 MED ORDER — HYDROCOD POLST-CHLORPHEN POLST 10-8 MG/5ML PO LQCR: 5.0000 mL | Freq: Two times a day (BID) | ORAL | Status: DC | PRN

## 2013-02-21 MED ORDER — AZITHROMYCIN 250 MG PO TABS: ORAL_TABLET | ORAL | Status: DC

## 2013-02-21 MED ORDER — GUAIFENESIN ER 1200 MG PO TB12: 1.0000 | ORAL_TABLET | Freq: Two times a day (BID) | ORAL | Status: DC | PRN

## 2013-02-21 MED ORDER — IPRATROPIUM BROMIDE 0.03 % NA SOLN: 2.0000 | Freq: Two times a day (BID) | NASAL | Status: DC

## 2013-02-21 NOTE — Progress Notes (Signed)
Subjective:    Patient ID: Christine [name], female    DOB: 08/15/1966, 47 y.o.   MRN: [id number]  HPI  A 47 year old female presents with fever and cough for 5 days.  Pt has productive cough that has worsened over the past 5 days accompanied by a fever, Tmax 102.4 on Thursday (02/18/13).  Pt has been sleeping in upright position because cough is worse when lying down.  Admits to "popping" ears when blowing nose, drainage is clear.  She has nasal congestion, sore throat, fatigue, and SOB with walking.  She had a fever last night of 99.8.  She has tried an inhaler that was prescribed 1 yr ago (? Name), Mucinex DM, and Sudafed with no relief.  Allergic to Penicillins and Sulfa drugs.   Past Medical History  Diagnosis Date  . Allergy     Past Surgical History  Procedure Laterality Date  . Cesarean section    . Eye surgery    . Fracture surgery    . Tubal ligation    . Abdominal hysterectomy      Prior to Admission medications   Medication Sig Start Date End Date Taking? Authorizing Provider  cholecalciferol (VITAMIN D) 1000 UNITS tablet Take 1,000 Units by mouth daily.   Yes Historical Provider, MD  dicyclomine (BENTYL) 10 MG/5ML syrup Take 20 mg by mouth 4 (four) times daily -  before meals and at bedtime.   Yes Historical Provider, MD  eletriptan (RELPAX) 40 MG tablet One tablet by mouth at onset of headache. May repeat in 2 hours if headache persists or recurs. may repeat in 2 hours if necessary   Yes Historical Provider, MD  fish oil-omega-3 fatty acids 1000 MG capsule Take 2 g by mouth daily.   Yes Historical Provider, MD  methocarbamol (ROBAXIN) 500 MG tablet Take 500 mg by mouth 4 (four) times daily.   Yes Historical Provider, MD  cyclobenzaprine (FLEXERIL) 10 MG tablet Take 10 mg by mouth 3 (three) times daily as needed.    Historical Provider, MD  verapamil (CALAN) 80 MG tablet Take 80 mg by mouth 3 (three) times daily.    Historical Provider, MD    Allergies  Allergen  Reactions  . Penicillins Hives  . Sulfa Antibiotics Hives    History   Social History  . Marital Status: Married    Spouse Name: N/A    Number of Children: N/A  . Years of Education: N/A   Occupational History  . Accountant    Social History Main Topics  . Smoking status: Former Smoker    Quit date: 02/22/1988  . Smokeless tobacco: Never Used  . Alcohol Use: No  . Drug Use: No  . Sexually Active: Yes   Other Topics Concern  . Not on file   Social History Narrative  . No narrative on file    Family History  Problem Relation Age of Onset  . Heart disease Son      Review of Systems As above.   Objective:   Physical Exam  BP 150/94  Pulse 74  Temp(Src) 97.8 F (36.6 C) (Oral)  Resp 16  Ht 5' 3.5" (1.613 m)  Wt 279 lb (126.554 kg)  BMI 48.64 kg/m2  SpO2 99%  LMP 04/02/2006  General:  Pleasant, morbidly obese female.  NAD. HEENT:  NCAT.  No bulging or erythema of TMs, landmarks visible.  Conjunctiva clear.  Erythema and edema turbinates.  Mild erythema of pharynx, no tonsillar exudate.  Neck:  Supple.  No thyromegaly or lymphadenopathy. Heart:  RRR.  Normal S1,S2.  No m/g/r. Lungs:  CTAB.     Results for orders placed in visit on 02/21/13  POCT RAPID STREP A (OFFICE)      Result Value Range   Rapid Strep A Screen Negative  Negative  POCT INFLUENZA A/B      Result Value Range   Influenza A, POC Negative     Influenza B, POC Negative    POCT CBC      Result Value Range   WBC 4.4 (*) 4.6 - 10.2 K/uL   Lymph, poc 1.3  0.6 - 3.4   POC LYMPH PERCENT 28.9  10 - 50 %L   MID (cbc) 0.5  0 - 0.9   POC MID % 11.1  0 - 12 %M   POC Granulocyte 2.6  2 - 6.9   Granulocyte percent 60.0  37 - 80 %G   RBC 5.01  4.04 - 5.48 M/uL   Hemoglobin 14.7  12.2 - 16.2 g/dL   HCT, POC 16.1  09.6 - 47.9 %   MCV 93.8  80 - 97 fL   MCH, POC 29.3  27 - 31.2 pg   MCHC 31.3 (*) 31.8 - 35.4 g/dL   RDW, POC 04.5     Platelet Count, POC 360  142 - 424 K/uL   MPV 7.5  0 -  99.8 fL     Assessment & Plan:  Acute pharyngitis - Plan: POCT rapid strep A, POCT Influenza A/B, POCT CBC, Culture, Group A Strep, azithromycin (ZITHROMAX) 250 MG tablet  Cough - Plan: POCT Influenza A/B, POCT CBC, Culture, Group A Strep, Guaifenesin (MUCINEX MAXIMUM STRENGTH) 1200 MG TB12, chlorpheniramine-HYDROcodone (TUSSIONEX PENNKINETIC ER) 10-8 MG/5ML LQCR, ipratropium (ATROVENT) 0.03 % nasal spray  Patient Instructions  Increase fluid intake to 64 oz per day.  Try to wash your hands before and after touching others.  Complete antibiotic until all pills are gone.  Make sure to notify your PCP about your xray with Dr. Darrelyn Hillock.  If your symptoms worsen or persist return to the clinic for re-evaluation.

## 2013-02-21 NOTE — Patient Instructions (Addendum)
Increase fluid intake to 64 oz per day.  Try to wash your hands before and after touching others.  Complete antibiotic until all pills are gone.  Take Tussionex at bedtime for cough because it can make you drowsy.  Use an over the counter antihistamine like Claritin or Zyrtec.  Make sure to notify your PCP about your xray with Dr. Darrelyn Hillock.  If your symptoms worsen or persist return to the clinic for re-evaluation.

## 2013-02-21 NOTE — Progress Notes (Signed)
I have examined this patient along with the student and agree. Ezechiel Stooksbury S. Abagael Kramm, PA-C Certified Physician Assistant [facility]  

## 2013-02-23 LAB — CULTURE, GROUP A STREP: Organism ID, Bacteria: NORMAL

## 2013-02-25 ENCOUNTER — Telehealth: Payer: Self-pay

## 2013-02-25 NOTE — Telephone Encounter (Signed)
Patient is requesting prednisone for her bronchitis  [facility] on [address]  [phone]

## 2013-02-26 MED ORDER — PREDNISONE 20 MG PO TABS: ORAL_TABLET | ORAL | Status: DC

## 2013-02-26 NOTE — Telephone Encounter (Signed)
Rx sent.  Please advise patient. Meds ordered this encounter  Medications  . predniSONE (DELTASONE) 20 MG tablet    Sig: Take 3 PO QAM x3days, 2 PO QAM x3days, 1 PO QAM x3days    Dispense:  18 tablet    Refill:  0    Order Specific Question:  Supervising Provider    Answer:  DOOLITTLE, ROBERT P [[id number]]

## 2013-02-26 NOTE — Telephone Encounter (Signed)
She is advised.

## 2013-05-07 ENCOUNTER — Telehealth: Payer: Self-pay | Admitting: Neurology

## 2014-02-03 NOTE — Telephone Encounter (Signed)
Pt never called back to r/s closing encounter

## 2014-11-08 ENCOUNTER — Ambulatory Visit (INDEPENDENT_AMBULATORY_CARE_PROVIDER_SITE_OTHER): Payer: 59 | Admitting: Family Medicine

## 2014-11-08 VITALS — BP 146/98 | HR 76 | Temp 98.1°F | Resp 16 | Ht 63.5 in | Wt 278.4 lb

## 2014-11-08 DIAGNOSIS — R8271 Bacteriuria: Secondary | ICD-10-CM

## 2014-11-08 DIAGNOSIS — R300 Dysuria: Secondary | ICD-10-CM

## 2014-11-08 DIAGNOSIS — N390 Urinary tract infection, site not specified: Secondary | ICD-10-CM

## 2014-11-08 LAB — POCT UA - MICROSCOPIC ONLY
CASTS, UR, LPF, POC: NEGATIVE
CRYSTALS, UR, HPF, POC: NEGATIVE
MUCUS UA: NEGATIVE
Yeast, UA: NEGATIVE

## 2014-11-08 LAB — POCT URINALYSIS DIPSTICK
Bilirubin, UA: NEGATIVE
Glucose, UA: NEGATIVE
Ketones, UA: NEGATIVE
Nitrite, UA: NEGATIVE
PH UA: 5.5
PROTEIN UA: NEGATIVE
SPEC GRAV UA: 1.015
UROBILINOGEN UA: 0.2

## 2014-11-08 MED ORDER — METHOCARBAMOL 500 MG PO TABS: 500.0000 mg | ORAL_TABLET | Freq: Four times a day (QID) | ORAL | Status: DC

## 2014-11-08 MED ORDER — CIPROFLOXACIN HCL 250 MG PO TABS: 250.0000 mg | ORAL_TABLET | Freq: Two times a day (BID) | ORAL | Status: DC

## 2014-11-08 NOTE — Progress Notes (Signed)
[facility] [address], [place] [address] [phone]  Date:  11/08/2014   Name:  Christine CabalBettie J [name]   DOB:  04/14/1966   MRN:  [id number]  PCP:  Martha ClanShaw, William, MD    Chief Complaint: Dysuria; Back Pain; and Medication Refill   History of Present Illness:  Christine [name] is a 48 y.o. very pleasant female patient who presents with the following: Patient states that on Friday she felt like she was burning with urination. She admits to urinary frequency and inability to empty her bladder. She took Advil and Urell, without relief. On Saturday, her back started hurtin gon the right side. She states she might of had a fever Saturday and she stayed in bed all day due to fatigue. She woke up drenched in sweat. She admits to chills as well. She is eating and drinking well, tolerating PO. She admits to lower abdominal pain. No vaginal irritation or discharge. In 2008, she had her bladder "stretched" and she has a history of frequent UTI and Cystitis. Patient has a history of fibromyalgia.   Patient Active Problem List   Diagnosis Date Noted  . Allergic rhinitis 02/21/2013  . Migraine 02/21/2013    Past Medical History  Diagnosis Date  . Allergy     Past Surgical History  Procedure Laterality Date  . Cesarean section    . Eye surgery    . Fracture surgery    . Tubal ligation    . Abdominal hysterectomy      History  Substance Use Topics  . Smoking status: Former Smoker    Quit date: 02/22/1988  . Smokeless tobacco: Never Used  . Alcohol Use: No    Family History  Problem Relation Age of Onset  . Heart disease Son     Allergies  Allergen Reactions  . Penicillins Hives  . Sulfa Antibiotics Hives    Medication list has been reviewed and updated.  Current Outpatient Prescriptions on File Prior to Visit  Medication Sig Dispense Refill  . cholecalciferol (VITAMIN D) 1000 UNITS tablet Take 1,000 Units by mouth daily.    Marland Kitchen. dicyclomine  (BENTYL) 10 MG/5ML syrup Take 20 mg by mouth 4 (four) times daily -  before meals and at bedtime.    Marland Kitchen. eletriptan (RELPAX) 40 MG tablet One tablet by mouth at onset of headache. May repeat in 2 hours if headache persists or recurs. may repeat in 2 hours if necessary    . fish oil-omega-3 fatty acids 1000 MG capsule Take 2 g by mouth daily.    . methocarbamol (ROBAXIN) 500 MG tablet Take 500 mg by mouth 4 (four) times daily.    . verapamil (CALAN) 80 MG tablet Take 80 mg by mouth 3 (three) times daily.     No current facility-administered medications on file prior to visit.    Review of Systems: Per HPI  Physical Examination: Filed Vitals:   11/08/14 0926  BP: 146/98  Pulse: 76  Temp: 98.1 F (36.7 C)  Resp: 16   Filed Vitals:   11/08/14 0926  Height: 5' 3.5" (1.613 m)  Weight: 278 lb 6.4 oz (126.281 kg)   Body mass index is 48.54 kg/(m^2). Ideal Body Weight: Weight in (lb) to have BMI = 25: 143.1  Gen: NAD. Pleasant female. Nontoxic in appearance. Well developed, well nourished.  HEENT: AT. [place]. Bilateral TM visualized and normal in appearance. . MMM. Abd: Soft. obese. Left upper quadrant discomfort. No suprapubic tenderness. ND.  BS present. no Masses palpated.  MSK: Mild Right  CVA tenderness.   Assessment and Plan: UA today: positive bacteria and WBC >> cipro prescribed Encouraged plenty of fluids and rest  Signed Abbe AmsterdamJessica Johnni Wunschel, MD

## 2014-11-08 NOTE — Patient Instructions (Signed)

## 2014-11-09 LAB — URINE CULTURE: Colony Count: >=100000

## 2014-11-28 ENCOUNTER — Encounter: Payer: Self-pay | Admitting: Family Medicine

## 2014-11-28 NOTE — Addendum Note (Signed)
Addended by: Abbe AmsterdamOPLAND, JESSICA C on: 11/28/2014 09:07 AM   Modules accepted: Orders

## 2015-05-18 ENCOUNTER — Encounter: Payer: Self-pay | Admitting: Rheumatology

## 2015-12-13 ENCOUNTER — Ambulatory Visit (INDEPENDENT_AMBULATORY_CARE_PROVIDER_SITE_OTHER): Payer: 59

## 2015-12-13 ENCOUNTER — Ambulatory Visit (INDEPENDENT_AMBULATORY_CARE_PROVIDER_SITE_OTHER): Payer: 59 | Admitting: Emergency Medicine

## 2015-12-13 VITALS — BP 118/70 | HR 88 | Temp 98.4°F | Resp 18 | Ht 63.5 in | Wt 269.0 lb

## 2015-12-13 DIAGNOSIS — M2391 Unspecified internal derangement of right knee: Secondary | ICD-10-CM

## 2015-12-13 DIAGNOSIS — M25561 Pain in right knee: Secondary | ICD-10-CM | POA: Diagnosis not present

## 2015-12-13 MED ORDER — HYDROCODONE-ACETAMINOPHEN 5-325 MG PO TABS: 1.0000 | ORAL_TABLET | ORAL | Status: DC | PRN

## 2015-12-13 NOTE — Progress Notes (Signed)
Subjective:  Patient ID: Christine [name], female    DOB: 11/06/1966  Age: 49 y.o. MRN: [id number]  CC: Knee Pain and Fall   HPI Christine [name] presents   The  Patient fell on December 2 and injured her right knee. Since that time Christine [name]'s been unable to bend her knees past about 45 of flexion. Christine [name] has swelling and pain and pain increases with ambulation and weightbearing. Christine [name] has some swelling. Christine [name] denies any improvement with over-the-counter medication. Christine [name] has no history of prior knee injury  History Christine [name] has a past medical history of Allergy.   Christine [name] has past surgical history that includes Cesarean section; Eye surgery; Fracture surgery; Tubal ligation; and Abdominal hysterectomy.   Her  family history includes Heart disease in her son.  Christine [name]   reports that Christine [name] quit smoking about 27 years ago. Christine [name] has never used smokeless tobacco. Christine [name] reports that Christine [name] does not drink alcohol or use illicit drugs.  Outpatient Prescriptions Prior to Visit  Medication Sig Dispense Refill  . cholecalciferol (VITAMIN D) 1000 UNITS tablet Take 1,000 Units by mouth daily.    Marland Kitchen dicyclomine (BENTYL) 10 MG/5ML syrup Take 20 mg by mouth 4 (four) times daily -  before meals and at bedtime.    . fish oil-omega-3 fatty acids 1000 MG capsule Take 2 g by mouth daily.    . ciprofloxacin (CIPRO) 250 MG tablet Take 1 tablet (250 mg total) by mouth 2 (two) times daily. (Patient not taking: Reported on 12/13/2015) 6 tablet 0  . eletriptan (RELPAX) 40 MG tablet Take 40 mg by mouth as needed. Reported on 12/13/2015    . methocarbamol (ROBAXIN) 500 MG tablet Take 1 tablet (500 mg total) by mouth 4 (four) times daily. (Patient not taking: Reported on 12/13/2015) 30 tablet 0  . verapamil (CALAN) 80 MG tablet Take 80 mg by mouth 3 (three) times daily. Reported on 12/13/2015     No facility-administered medications prior to visit.    Social History   Social History  . Marital Status: Married    Spouse Name: N/A  .  Number of Children: N/A  . Years of Education: N/A   Occupational History  . Accountant    Social History Main Topics  . Smoking status: Former Smoker    Quit date: 02/22/1988  . Smokeless tobacco: Never Used  . Alcohol Use: No  . Drug Use: No  . Sexual Activity: Yes   Other Topics Concern  . None   Social History Narrative     Review of Systems  Constitutional: Negative for fever, chills and appetite change.  HENT: Negative for congestion, ear pain, postnasal drip, sinus pressure and sore throat.   Eyes: Negative for pain and redness.  Respiratory: Negative for cough, shortness of breath and wheezing.   Cardiovascular: Negative for leg swelling.  Gastrointestinal: Negative for nausea, vomiting, abdominal pain, diarrhea, constipation and blood in stool.  Endocrine: Negative for polyuria.  Genitourinary: Negative for dysuria, urgency, frequency and flank pain.  Musculoskeletal: Negative for gait problem.  Skin: Negative for rash.  Neurological: Negative for weakness and headaches.  Psychiatric/Behavioral: Negative for confusion and decreased concentration. The patient is not nervous/anxious.     Objective:  BP 118/70 mmHg  Pulse 88  Temp(Src) 98.4 F (36.9 C) (Oral)  Resp 18  Ht 5' 3.5" (1.613 m)  Wt 269 lb (122.018 kg)  BMI 46.90 kg/m2  SpO2 98%  LMP 04/02/2006  Physical Exam  Constitutional: Christine [name] is oriented to  person, place, and time. Christine [name] appears well-developed and well-nourished.  HENT:  Head: Normocephalic and atraumatic.  Eyes: Conjunctivae are normal. Pupils are equal, round, and reactive to light.  Pulmonary/Chest: Effort normal.  Musculoskeletal: Christine [name] exhibits no edema.       Right knee: Christine [name] exhibits decreased range of motion, swelling and ecchymosis. Christine [name] exhibits no deformity. Tenderness found.  Neurological: Christine [name] is alert and oriented to person, place, and time.  Skin: Skin is dry.  Psychiatric: Christine [name] has a normal mood and affect. Her behavior is normal.  Thought content normal.      Assessment & Plan:   Christine [name] was seen today for knee pain and fall.  Diagnoses and all orders for this visit:  Internal derangement of knee, right -     DG Knee Complete 4 Views Right; Future -     Ambulatory referral to Orthopedic Surgery  Other orders -     HYDROcodone-acetaminophen (NORCO) 5-325 MG tablet; Take 1-2 tablets by mouth every 4 (four) hours as needed.  I am having Ms. Bodin start on HYDROcodone-acetaminophen. I am also having her maintain her verapamil, eletriptan, fish oil-omega-3 fatty acids, cholecalciferol, dicyclomine, methocarbamol, and ciprofloxacin.  Meds ordered this encounter  Medications  . HYDROcodone-acetaminophen (NORCO) 5-325 MG tablet    Sig: Take 1-2 tablets by mouth every 4 (four) hours as needed.    Dispense:  30 tablet    Refill:  0    Appropriate red flag conditions were discussed with the patient as well as actions that should be taken.  Patient expressed his understanding.  Follow-up: Return if symptoms worsen or fail to improve.  Carmelina DaneAnderson, Amani Nodarse S, MD   [facility] reading (PRIMARY) by  Dr. Dareen PianoAnderson  No osseous injury.

## 2015-12-13 NOTE — Patient Instructions (Signed)

## 2015-12-20 ENCOUNTER — Telehealth: Payer: Self-pay

## 2015-12-20 NOTE — Telephone Encounter (Signed)
Pt need to come by and pick up a cd of her knee. Please call [phone] and would like to come by in the morning

## 2015-12-20 NOTE — Telephone Encounter (Signed)
Pt in need of her HYDROCODONE 5-325 MG. Please call [phone] when ready for pick up

## 2015-12-20 NOTE — Telephone Encounter (Signed)
Dr. Anderson does not refill narcotics. 

## 2015-12-21 ENCOUNTER — Telehealth: Payer: Self-pay

## 2015-12-21 MED ORDER — HYDROCODONE-ACETAMINOPHEN 5-325 MG PO TABS: 1.0000 | ORAL_TABLET | ORAL | Status: DC | PRN

## 2015-12-21 NOTE — Telephone Encounter (Signed)
Patient would like to know what she can do about her pain. She states that she was only given a weeks worth of hydrocodone and she has an appointment next week

## 2015-12-21 NOTE — Addendum Note (Signed)
Addended by: Carmelina DaneANDERSON, Arin Vanosdol S on: 12/21/2015 01:13 PM   Modules accepted: Orders, SmartSet

## 2015-12-21 NOTE — Telephone Encounter (Signed)
She can come and pick up a prescription 

## 2015-12-21 NOTE — Telephone Encounter (Signed)
Called pt to let her know she can pick up Hydrocodone rx

## 2016-10-18 DIAGNOSIS — R5383 Other fatigue: Secondary | ICD-10-CM | POA: Insufficient documentation

## 2016-10-18 DIAGNOSIS — M797 Fibromyalgia: Secondary | ICD-10-CM | POA: Insufficient documentation

## 2016-10-18 DIAGNOSIS — M7731 Calcaneal spur, right foot: Secondary | ICD-10-CM | POA: Insufficient documentation

## 2016-10-18 DIAGNOSIS — M7732 Calcaneal spur, left foot: Secondary | ICD-10-CM

## 2016-10-18 DIAGNOSIS — M2240 Chondromalacia patellae, unspecified knee: Secondary | ICD-10-CM | POA: Insufficient documentation

## 2016-10-18 DIAGNOSIS — M7061 Trochanteric bursitis, right hip: Secondary | ICD-10-CM | POA: Insufficient documentation

## 2016-10-18 DIAGNOSIS — M7062 Trochanteric bursitis, left hip: Secondary | ICD-10-CM

## 2016-10-18 DIAGNOSIS — F5101 Primary insomnia: Secondary | ICD-10-CM | POA: Insufficient documentation

## 2016-10-18 DIAGNOSIS — M170 Bilateral primary osteoarthritis of knee: Secondary | ICD-10-CM | POA: Insufficient documentation

## 2016-10-18 DIAGNOSIS — M47816 Spondylosis without myelopathy or radiculopathy, lumbar region: Secondary | ICD-10-CM | POA: Insufficient documentation

## 2016-10-18 DIAGNOSIS — K589 Irritable bowel syndrome without diarrhea: Secondary | ICD-10-CM | POA: Insufficient documentation

## 2016-10-18 DIAGNOSIS — E559 Vitamin D deficiency, unspecified: Secondary | ICD-10-CM | POA: Insufficient documentation

## 2016-10-18 NOTE — Progress Notes (Signed)
*IMAGE* Office Visit Note  Patient: Christine [name]             Date of Birth: [birth year]           MRN: [id number]             PCP: Marton Redwood, MD Referring: Marton Redwood, MD Visit Date: 10/21/2016 Occupation: Accounting    Subjective:  Neck pain.   History of Present Illness: Christine [name] is a 50 y.o. female with history of fibromyalgia syndrome, osteoarthritis and chronic insomnia. He states her neck and lower back pain persist she also has some discomfort in her knee joints. She denies any joint swelling her pain on a scale of 0-10 has been about 5-6 and fatigue on 0-10 has been about 2. Her insomnia is better on Zanaflex.  Activities of Daily Living:  Patient reports morning stiffness for 5 minutes.   Patient Reports nocturnal pain.  Difficulty dressing/grooming: Denies Difficulty climbing stairs: Denies Difficulty getting out of chair: Denies Difficulty using hands for taps, buttons, cutlery, and/or writing: Denies   Review of Systems  Constitutional: Positive for fatigue, weight loss and weakness. Negative for night sweats and weight gain.  HENT: Negative for mouth sores, trouble swallowing, trouble swallowing, mouth dryness and nose dryness.   Eyes: Positive for redness. Negative for pain, visual disturbance and dryness.  Respiratory: Negative for cough, shortness of breath and difficulty breathing.   Cardiovascular: Positive for hypertension. Negative for chest pain, palpitations, irregular heartbeat and swelling in legs/feet.  Gastrointestinal: Negative for blood in stool, constipation and diarrhea.  Endocrine: Negative for increased urination.  Genitourinary: Negative for vaginal dryness.  Musculoskeletal: Positive for arthralgias, joint pain, myalgias, morning stiffness and myalgias. Negative for joint swelling, muscle weakness and muscle tenderness.  Skin: Positive for hair loss. Negative for color change, rash, skin tightness, ulcers and sensitivity to  sunlight.  Allergic/Immunologic: Negative for susceptible to infections.  Neurological: Negative for dizziness, memory loss and night sweats.  Hematological: Negative for swollen glands.  Psychiatric/Behavioral: Positive for sleep disturbance. Negative for depressed mood. The patient is not nervous/anxious.     PMFS History:  Patient Active Problem List   Diagnosis Date Noted  . IBS (irritable bowel syndrome) 10/18/2016  . Fibromyalgia 10/18/2016  . Fatigue 10/18/2016  . Primary insomnia 10/18/2016  . 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 Trochanteric bursitis of both hips 10/18/2016  . Primary osteoarthritis of both knees 10/18/2016  . 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 Bilateral calcaneal spurs 10/18/2016  . Vitamin D deficiency 10/18/2016  . UTI (urinary tract infection) 11/08/2014  . Allergic rhinitis 02/21/2013  . Migraine 02/21/2013    Past Medical History:  Diagnosis Date  . Allergy   . Fibromyalgia     Family History  Problem Relation Age of Onset  . Heart disease Son    Past Surgical History:  Procedure Laterality Date  . ABDOMINAL HYSTERECTOMY    . CESAREAN SECTION    . EYE SURGERY    . FRACTURE SURGERY    . TUBAL LIGATION     Social History   Social History Narrative  . No narrative on file     Objective: Vital Signs: BP (!) 166/97 (BP Location: Left Arm, Patient Position: Sitting, Cuff Size: Large)   Pulse 78   Resp 14   Ht _0  (1.6 m)   Wt 249 lb (112.9 kg)   LMP 04/02/2006   BMI 44.11 kg/m    Physical Exam  Constitutional:  She is oriented to person, place, and time. She appears well-developed and well-nourished.  HENT:  Head: Normocephalic and atraumatic.  Eyes: Conjunctivae and EOM are normal.  Neck: Normal range of motion.  Cardiovascular: Normal rate, regular rhythm, normal heart sounds and intact distal pulses.   Pulmonary/Chest: Effort normal and breath sounds normal.  Abdominal: Soft. Bowel  sounds are normal.  Lymphadenopathy:    She has no cervical adenopathy.  Neurological: She is alert and oriented to person, place, and time.  Skin: Skin is warm and dry. Capillary refill takes less than 2 seconds.  Psychiatric: She has a normal mood and affect. Her behavior is normal.  Nursing note and vitals reviewed.    Musculoskeletal Exam: C-spine and thoracic spine good range of motion she has discomfort with range of motion of her lumbar spine. Bilateral shoulder joints, elbow joints, wrist joints, MCPs, PIPs and DIPs with good range of motion with no synovitis. She had good range of motion of her hip joints knee joints ankles MTPs PIPs with no warmth swelling or effusion. She continues to have some discomfort in her bilateral trochanteric bursa area but is tolerable.  CDAI Exam: No CDAI exam completed.    Investigation: Findings:  Labs from 05/18/2015 CMP normal, CBC hemoglobin 11.0 ,sedimentation rate 22, CK normal, TSH normal, ace 38, ANA negative, C3-C4 normal, rheumatoid factor negative, CCP negative, ENA negative, HLA-B27 negative    Imaging: No results found.  Speciality Comments: No specialty comments available.    Procedures:  No procedures performed Allergies: Penicillins and Sulfa antibiotics   Assessment / Plan: Visit Diagnoses: Fibromyalgia she continues to have generalized pain and discomfort, fatigue. Need for regular exercise and muscle strengthening was discussed. I will refill her Robaxin today.  Fatigue: Probably secondary to underlying insomnia  Primary insomnia: Is better with the medications. Good sleep hygiene was also discussed.  Degenerative disc disease of lumbar spine: She has ongoing pain and discomfort in her lower back. Weight loss would be helpful. She had some intentional weight loss due to dietary modifications. Abdomen her son lumbar spine exercises. For her work I've also given her a prescription for standing desk.  Trochanteric  bursitis of both hips: IT been exercise handout was given.  Primary osteoarthritis of both knees: Weight loss and muscle strengthening will be helpful. She is gradually working on that.  Chondromalacia, patella: She has some discomfort with climbing the stairs.  Irritable bowel syndrome: Symptoms are manageable currently.    Orders: Orders Placed This Encounter  Procedures  . DME Other see comment   Meds ordered this encounter  Medications  .       .                         . methocarbamol (ROBAXIN) 500 MG tablet    Sig: Take 1 tablet (500 mg total) by mouth 2 (two) times daily with breakfast and lunch.    Dispense:  60 tablet    Refill:  5    Face-to-face time spent with patient was 30 minutes. 50% of time was spent in counseling and coordination of care.  Follow-Up Instructions: Return in about 6 months (around 04/20/2017) for Fibromyalgia.   Bo Merino, MD, Julious Payer

## 2016-10-21 ENCOUNTER — Ambulatory Visit (INDEPENDENT_AMBULATORY_CARE_PROVIDER_SITE_OTHER): Payer: 59 | Admitting: Rheumatology

## 2016-10-21 ENCOUNTER — Encounter: Payer: Self-pay | Admitting: Rheumatology

## 2016-10-21 VITALS — BP 166/97 | HR 78 | Resp 14 | Ht 63.0 in | Wt 249.0 lb

## 2016-10-21 DIAGNOSIS — M47816 Spondylosis without myelopathy or radiculopathy, lumbar region: Secondary | ICD-10-CM | POA: Diagnosis not present

## 2016-10-21 DIAGNOSIS — M7061 Trochanteric bursitis, right hip: Secondary | ICD-10-CM | POA: Diagnosis not present

## 2016-10-21 DIAGNOSIS — M7062 Trochanteric bursitis, left hip: Secondary | ICD-10-CM

## 2016-10-21 DIAGNOSIS — R5383 Other fatigue: Secondary | ICD-10-CM | POA: Diagnosis not present

## 2016-10-21 DIAGNOSIS — F5101 Primary insomnia: Secondary | ICD-10-CM

## 2016-10-21 DIAGNOSIS — M797 Fibromyalgia: Secondary | ICD-10-CM | POA: Diagnosis not present

## 2016-10-21 DIAGNOSIS — M170 Bilateral primary osteoarthritis of knee: Secondary | ICD-10-CM

## 2016-10-21 DIAGNOSIS — M2240 Chondromalacia patellae, unspecified knee: Secondary | ICD-10-CM

## 2016-10-21 DIAGNOSIS — K589 Irritable bowel syndrome without diarrhea: Secondary | ICD-10-CM | POA: Diagnosis not present

## 2016-10-21 MED ORDER — METHOCARBAMOL 500 MG PO TABS: 500.0000 mg | ORAL_TABLET | Freq: Two times a day (BID) | ORAL | 5 refills | Status: DC

## 2016-10-21 NOTE — Patient Instructions (Signed)
Iliotibial Band Syndrome With Rehab The iliotibial (IT) band is a tendon that connects the hip muscles to the shinbone (tibia) and to one of the bones of the pelvis (ileum). The IT band passes by the knee and is often irritated by the outer portion of the knee (lateral femoral condyle). A fluid filled sac (bursa) exists between the tendon and the bone, to cushion and reduce friction. Overuse of the tendon may cause excessive friction, which results in IT band syndrome. This condition involves inflammation of the bursa (bursitis) and/or inflammation of the IT band (tendinitis). SYMPTOMS   Pain, tenderness, swelling, warmth, or redness over the IT band, at the outer knee (above the joint).  Pain that travels up or down the thigh or leg.  Initially, pain at the beginning of an exercise, that decreases once warmed up. Eventually, pain throughout the activity, getting worse as the activity continues. May cause the athlete to stop in the middle of training or competing.  Pain that gets worse when running down hills or stairs, on banked tracks, or next to the curb on the street.  Pain that increases when the foot of the affected leg hits the ground.  Possibly, a crackling sound (crepitation) when the tendon or bursa is moved or touched. CAUSES  IT band syndrome is caused by irritation of the IT band and the underlying bursa. This eventually results in inflammation and pain. IT band syndrome is an overuse injury.  RISK INCREASES WITH:  Sports with repetitive knee-bending activities (distance running, cycling).  Incorrect training techniques, including sudden changes in the intensity, frequency, or duration of training.  Not enough rest between workouts.  Poor strength and flexibility, especially a tight IT band.  Failure to warm up properly before activity.  Bow legs.  Arthritis of the knee. PREVENTION   Warm up and stretch properly before activity.  Allow for adequate recovery between  workouts.  Maintain physical fitness:  Strength, flexibility, and endurance.  Cardiovascular fitness.  Learn and use proper training technique, including reducing running mileage, shortening stride, and avoiding running on hills and banked surfaces.  Wear arch supports (orthotics), if you have flat feet. PROGNOSIS  If treated properly, IT band syndrome usually goes away within 6 weeks of treatment. RELATED COMPLICATIONS   Longer healing time, if not properly treated, or if not given enough time to heal.  Recurring inflammation of the tendon and bursa, that may result in a chronic condition.  Recurring symptoms, if activity is resumed too soon, with overuse, with a direct blow, or with poor training technique.  Inability to complete training or competition. TREATMENT  Treatment first involves the use of ice and medicine, to reduce pain and inflammation. The use of strengthening and stretching exercises may help reduce pain with activity. These exercises may be performed at home or with a therapist. For individuals with flat feet, an arch support (orthotic) may be helpful. Some individuals find that wearing a knee sleeve or compression bandage around the knee during workouts provides some relief. Certain training techniques, such as adjusting stride length, avoiding running on hills or stairs, changing the direction you run on a circular or banked track, or changing the side of the road you run on, if you run next to the curb, may help decrease symptoms of IT band syndrome. Cyclists may need to change the seat height or foot position on their bicycles. An injection of cortisone into the bursa may be recommended. Surgery to remove the inflamed bursa and/or part   of the IT band is only considered after at least 6 months of non-surgical treatment.  MEDICATION   If pain medicine is needed, nonsteroidal anti-inflammatory medicines (aspirin and ibuprofen), or other minor pain relievers  (acetaminophen), are often advised.  Do not take pain medicine for 7 days before surgery.  Prescription pain relievers may be given, if your caregiver thinks they are needed. Use only as directed and only as much as you need.  Corticosteroid injections may be given by your caregiver. These injections should be reserved for the most serious cases, because they may only be given a certain number of times. HEAT AND COLD  Cold treatment (icing) should be applied for 10 to 15 minutes every 2 to 3 hours for inflammation and pain, and immediately after activity that aggravates your symptoms. Use ice packs or an ice massage.  Heat treatment may be used before performing stretching and strengthening activities prescribed by your caregiver, physical therapist, or athletic trainer. Use a heat pack or a warm water soak. SEEK MEDICAL CARE IF:   Symptoms get worse or do not improve in 2 to 4 weeks, despite treatment.  New, unexplained symptoms develop. (Drugs used in treatment may produce side effects.) EXERCISES  RANGE OF MOTION (ROM) AND STRETCHING EXERCISES - Iliotibial Band Syndrome These exercises may help you when beginning to rehabilitate your injury. Your symptoms may go away with or without further involvement from your physician, physical therapist or athletic trainer. While completing these exercises, remember:   Restoring tissue flexibility helps normal motion to return to the joints. This allows healthier, less painful movement and activity.  An effective stretch should be held for at least 30 seconds.  A stretch should never be painful. You should only feel a gentle lengthening or release in the stretched tissue. STRETCH - Quadriceps, Prone   Lie on your stomach on a firm surface, such as a bed or padded floor.  Bend your right / left knee and grasp your ankle. If you are unable to reach your ankle or pant leg, use a belt around your foot to lengthen your reach.  Gently pull your heel  toward your buttocks. Your knee should not slide out to the side. You should feel a stretch in the front of your thigh and knee.  Hold this position for __________ seconds. Repeat __________ times. Complete this stretch __________ times per day.  STRETCH - Iliotibial Band  On the floor or bed, lie on your side, so your right / left leg is on top. Bend your knee and grab your ankle.  Slowly bring your knee back so that your thigh is in line with your trunk. Keep your heel at your buttocks and gently arch your back, so your head, shoulders and hips line up.  Slowly lower your leg so that your knee approaches the floor or bed, until you feel a gentle stretch on the outside of your right / left thigh. If you do not feel a stretch and your knee will not fall farther, place the heel of your opposite foot on top of your knee, and pull your thigh down farther.  Hold this stretch for __________ seconds. Repeat __________ times. Complete this stretch __________ times per day. STRENGTHENING EXERCISES - Iliotibial Band Syndrome Improving the flexibility of the IT band will best relieve your discomfort due to IT band syndrome. Strengthening exercises, however, can help improve both muscle endurance and joint mechanics, reducing the factors that can contribute to this condition. Your physician, physical   therapist or athletic trainer may provide you with exercises that train specific muscle groups that are especially weak. The following exercises target muscles that are often weak in people who have IT band syndrome. STRENGTH - Hip Abductors, Straight Leg Raises  Be aware of your form throughout the entire exercise, so that you exercise the correct muscles. Poor form means that you are not strengthening the correct muscles.  Lie on your side, so that your head, shoulders, knee and hip line up. You may bend your lower knee to help maintain your balance. Your right / left leg should be on top.  Roll your hips  slightly forward, so that your hips are stacked directly over each other and your right / left knee is facing forward.  Lift your top leg up 4-6 inches, leading with your heel. Be sure that your foot does not drift forward and that your knee does not roll toward the ceiling.  Hold this position for __________ seconds. You should feel the muscles in your outer hip lifting (you may not notice this until your leg begins to tire).  Slowly lower your leg to the starting position. Allow the muscles to fully relax before beginning the next repetition. Repeat __________ times. Complete this exercise __________ times per day.  STRENGTH - Quad/VMO, Isometric  Sit in a chair with your right / left knee slightly bent. With your fingertips, feel the VMO muscle (just above the inside of your knee). The VMO is important in controlling the position of your kneecap.  Keeping your fingertips on this muscle. Without actually moving your leg, attempt to drive your knee down, as if straightening your leg. You should feel your VMO tense. If you have a difficult time, you may wish to try the same exercise on your healthy knee first.  Tense this muscle as hard as you can, without increasing any knee pain.  Hold for __________ seconds. Relax the muscles slowly and completely between each repetition. Repeat __________ times. Complete this exercise __________ times per day.    This information is not intended to replace advice given to you by your health care provider. Make sure you discuss any questions you have with your health care provider.   Document Released: 12/02/2005 Document Revised: 12/23/2014 Document Reviewed: 03/16/2009 Elsevier Interactive Patient Education 2016 [employer] Back Exercises The following exercises strengthen the muscles that help to support the back. They also help to keep the lower back flexible. Doing these exercises can help to prevent back pain or lessen existing pain. If you have  back pain or discomfort, try doing these exercises 2-3 times each day or as told by your health care provider. When the pain goes away, do them once each day, but increase the number of times that you repeat the steps for each exercise (do more repetitions). If you do not have back pain or discomfort, do these exercises once each day or as told by your health care provider. EXERCISES Single Knee to Chest Repeat these steps 3-5 times for each leg: 1. Lie on your back on a firm bed or the floor with your legs extended. 2. Bring one knee to your chest. Your other leg should stay extended and in contact with the floor. 3. Hold your knee in place by grabbing your knee or thigh. 4. Pull on your knee until you feel a gentle stretch in your lower back. 5. Hold the stretch for 10-30 seconds. 6. Slowly release and straighten your leg. Pelvic Tilt Repeat  these steps 5-10 times: 1. Lie on your back on a firm bed or the floor with your legs extended. 2. Bend your knees so they are pointing toward the ceiling and your feet are flat on the floor. 3. Tighten your lower abdominal muscles to press your lower back against the floor. This motion will tilt your pelvis so your tailbone points up toward the ceiling instead of pointing to your feet or the floor. 4. With gentle tension and even breathing, hold this position for 5-10 seconds. Cat-Cow Repeat these steps until your lower back becomes more flexible: 1. Get into a hands-and-knees position on a firm surface. Keep your hands under your shoulders, and keep your knees under your hips. You may place padding under your knees for comfort. 2. Let your head hang down, and point your tailbone toward the floor so your lower back becomes rounded like the back of a cat. 3. Hold this position for 5 seconds. 4. Slowly lift your head and point your tailbone up toward the ceiling so your back forms a sagging arch like the back of a cow. 5. Hold this position for 5  seconds. Press-Ups Repeat these steps 5-10 times: 1. Lie on your abdomen (face-down) on the floor. 2. Place your palms near your head, about shoulder-width apart. 3. While you keep your back as relaxed as possible and keep your hips on the floor, slowly straighten your arms to raise the top half of your body and lift your shoulders. Do not use your back muscles to raise your upper torso. You may adjust the placement of your hands to make yourself more comfortable. 4. Hold this position for 5 seconds while you keep your back relaxed. 5. Slowly return to lying flat on the floor. Bridges Repeat these steps 10 times: 1. Lie on your back on a firm surface. 2. Bend your knees so they are pointing toward the ceiling and your feet are flat on the floor. 3. Tighten your buttocks muscles and lift your buttocks off of the floor until your waist is at almost the same height as your knees. You should feel the muscles working in your buttocks and the back of your thighs. If you do not feel these muscles, slide your feet 1-2 inches farther away from your buttocks. 4. Hold this position for 3-5 seconds. 5. Slowly lower your hips to the starting position, and allow your buttocks muscles to relax completely. If this exercise is too easy, try doing it with your arms crossed over your chest. Abdominal Crunches Repeat these steps 5-10 times: 1. Lie on your back on a firm bed or the floor with your legs extended. 2. Bend your knees so they are pointing toward the ceiling and your feet are flat on the floor. 3. Cross your arms over your chest. 4. Tip your chin slightly toward your chest without bending your neck. 5. Tighten your abdominal muscles and slowly raise your trunk (torso) high enough to lift your shoulder blades a tiny bit off of the floor. Avoid raising your torso higher than that, because it can put too much stress on your low back and it does not help to strengthen your abdominal muscles. 6. Slowly  return to your starting position. Back Lifts Repeat these steps 5-10 times: 1. Lie on your abdomen (face-down) with your arms at your sides, and rest your forehead on the floor. 2. Tighten the muscles in your legs and your buttocks. 3. Slowly lift your chest off of the floor  while you keep your hips pressed to the floor. Keep the back of your head in line with the curve in your back. Your eyes should be looking at the floor. 4. Hold this position for 3-5 seconds. 5. Slowly return to your starting position. SEEK MEDICAL CARE IF:  Your back pain or discomfort gets much worse when you do an exercise.  Your back pain or discomfort does not lessen within 2 hours after you exercise. If you have any of these problems, stop doing these exercises right away. Do not do them again unless your health care provider says that you can. SEEK IMMEDIATE MEDICAL CARE IF:  You develop sudden, severe back pain. If this happens, stop doing the exercises right away. Do not do them again unless your health care provider says that you can.   This information is not intended to replace advice given to you by your health care provider. Make sure you discuss any questions you have with your health care provider.   Document Released: 01/09/2005 Document Revised: 08/23/2015 Document Reviewed: 01/26/2015 Elsevier Interactive Patient Education [employer]2016 [employer] Inc[employer]

## 2017-01-25 IMAGING — CR DG KNEE COMPLETE 4+V*R*
4 series · 4 of 4 positions shown · non-contrast
Comparison: None

CLINICAL DATA: RIGHT knee pain of unknown origin

EXAM:
RIGHT KNEE - COMPLETE 4+ VIEW

[AP]
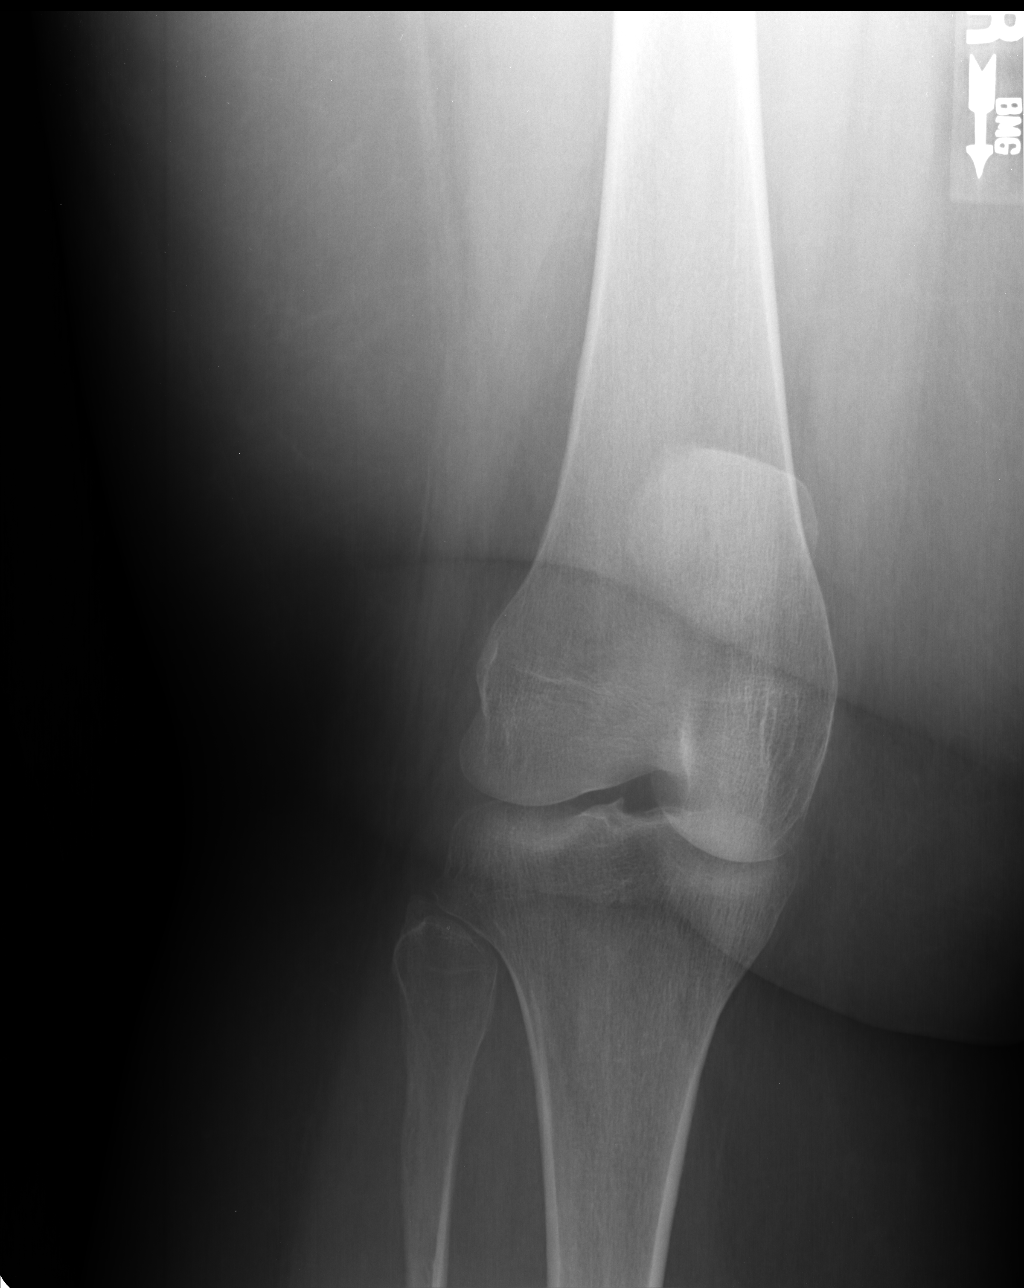

[ap axial]
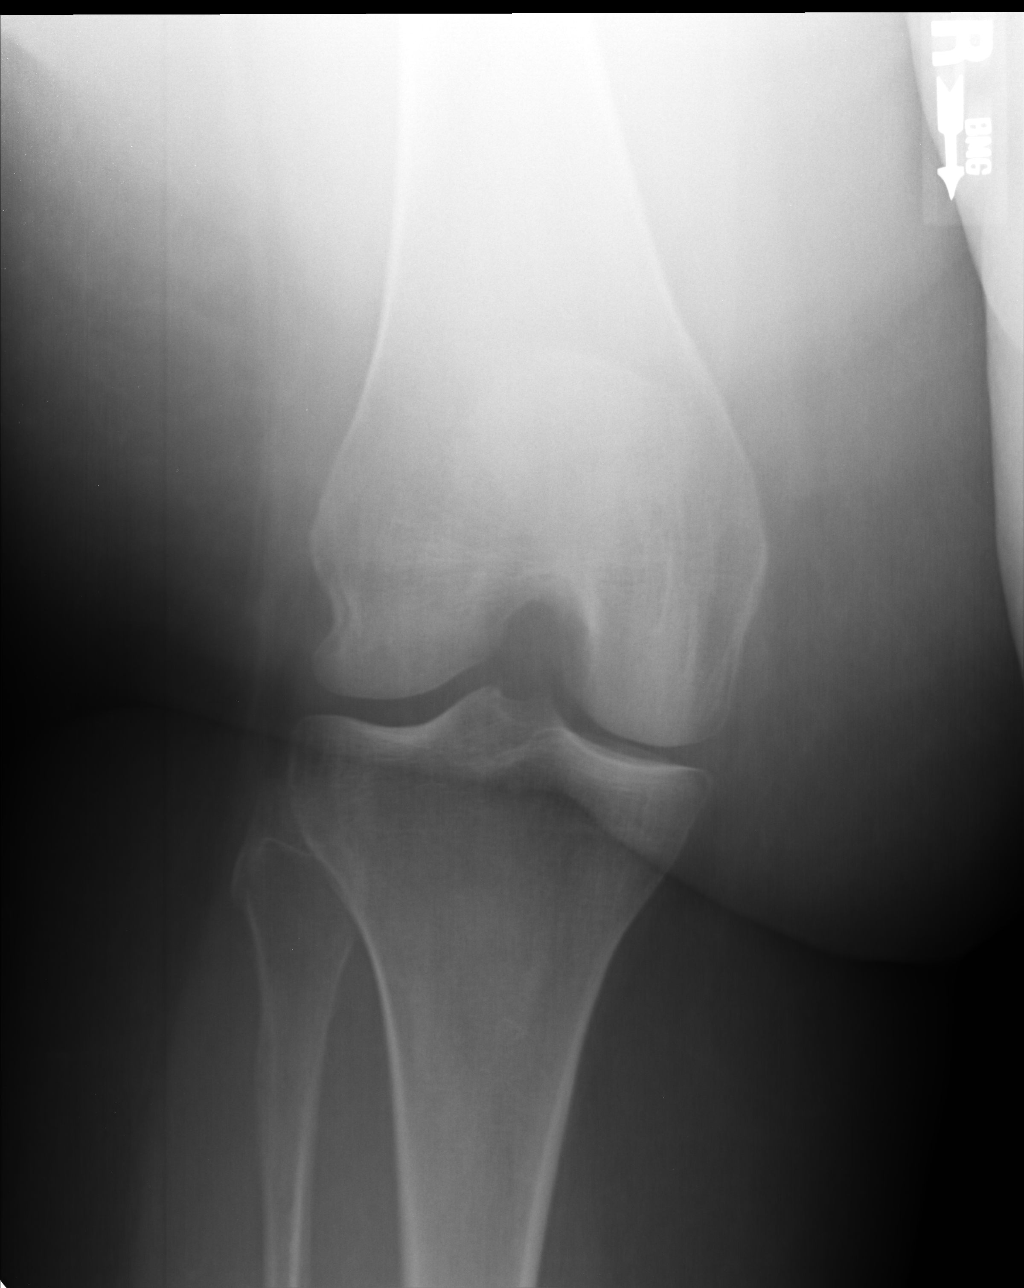

[lateral]
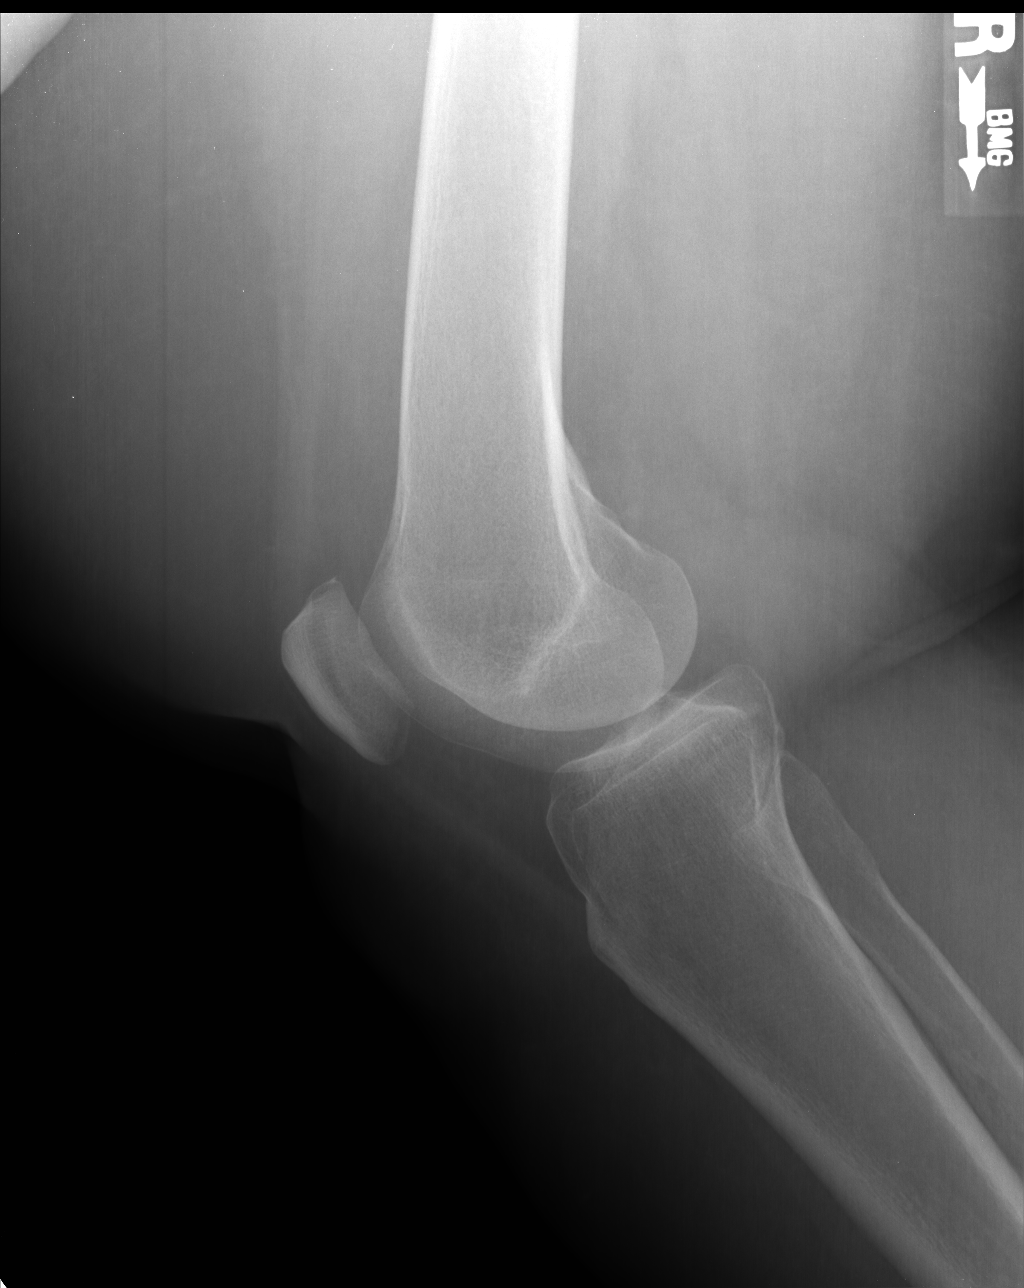

[sunrise]
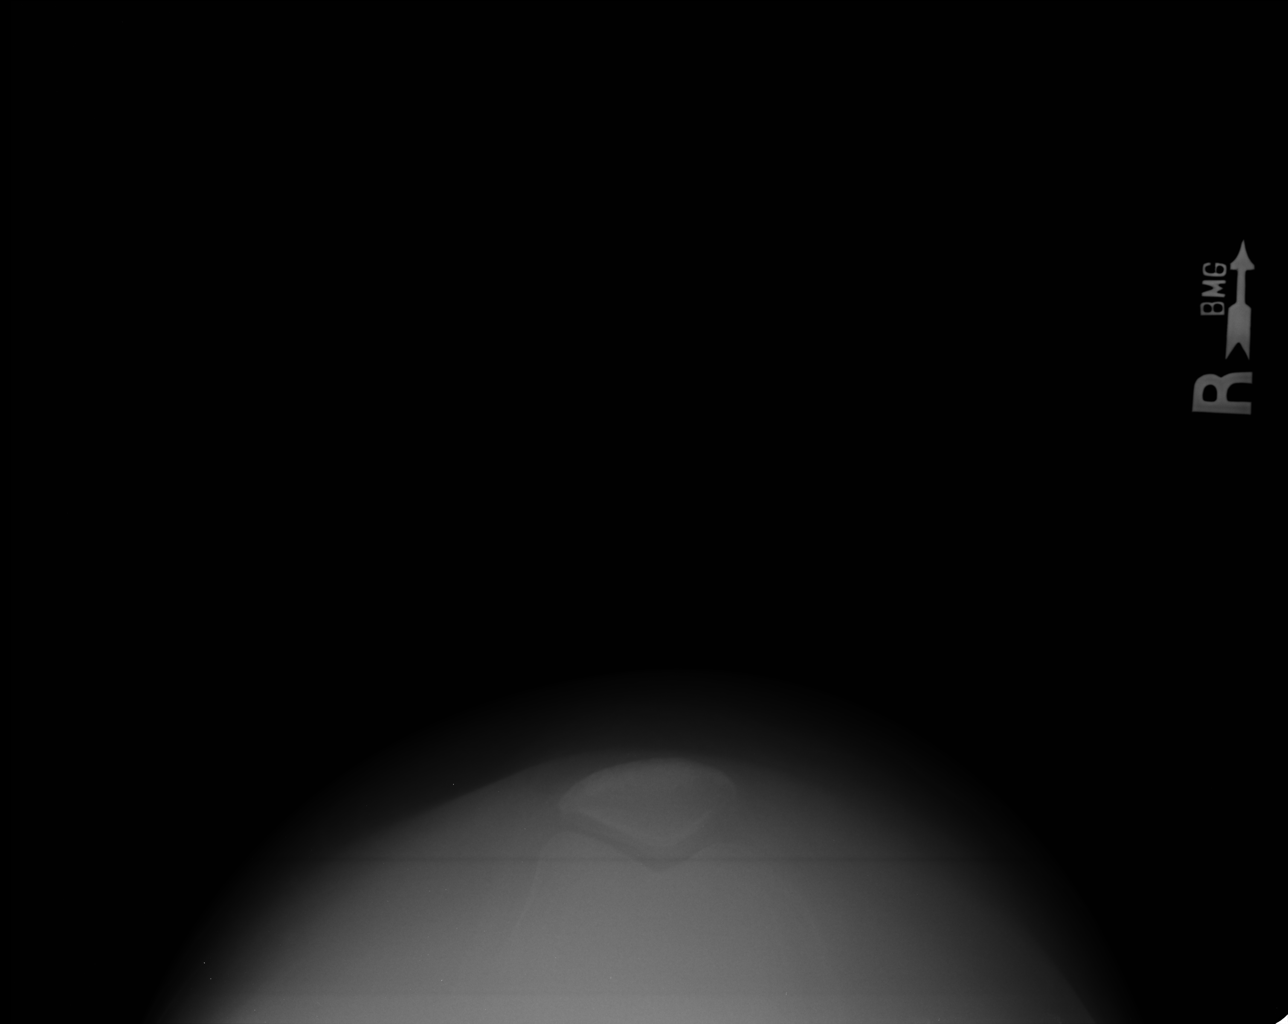

[4 of 4 positions shown; findings below may reference images not displayed]

FINDINGS: Diffuse osseous demineralization.

Joint space narrowing.

Minimal motion artifact on the tunnel view.

No acute fracture, dislocation or bone destruction.

No knee joint effusion.
IMPRESSION: Osseous demineralization with mild degenerative changes.

No acute abnormalities.

## 2017-01-29 ENCOUNTER — Other Ambulatory Visit: Payer: Self-pay | Admitting: Rheumatology

## 2017-01-29 NOTE — Telephone Encounter (Signed)
Last Visit: 10/21/16 Next Visit: 04/21/17  Okay to refill Tizanidine?

## 2017-04-16 NOTE — Progress Notes (Signed)
Office Visit Note  Patient: Christine [name]             Date of Birth: 10-May-1966           MRN: [id number]             PCP: Marton Redwood, MD Referring: Marton Redwood, MD Visit Date: 04/21/2017 Occupation: @GUAROCC @    Subjective:  Pain knees   History of Present Illness: Christine [name] is a 51 y.o. female with history of osteoarthritis. She reports ongoing pain and discomfort in her bilateral knee joints and feet. She states she has some discomfort in her left trochanteric bursa off-and-on. Lower back pain is tolerable. It only causes problems with certain activities. 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  Activities of Daily Living:  Patient reports morning stiffness for 20 minutes.   Patient Reports nocturnal pain.  Difficulty dressing/grooming: Denies Difficulty climbing stairs: Reports Difficulty getting out of chair: Reports Difficulty using hands for taps, buttons, cutlery, and/or writing: Denies   Review of Systems  Constitutional: Positive for fatigue and weakness. Negative for weight gain and weight loss.  HENT: Negative for mouth sores, mouth dryness and nose dryness.   Eyes: Negative for pain, redness and dryness.  Respiratory: Negative for cough, shortness of breath and difficulty breathing.   Cardiovascular: Positive for hypertension and swelling in legs/feet. Negative for chest pain, palpitations and irregular heartbeat.  Gastrointestinal: Negative for blood in stool, constipation and diarrhea.  Genitourinary: Negative for painful urination.  Musculoskeletal: Positive for arthralgias, joint pain, myalgias, muscle weakness, morning stiffness and myalgias. Negative for joint swelling.  Skin: Negative for color change, rash, skin tightness and ulcers.  Hematological: Negative for swollen glands.  Psychiatric/Behavioral: Positive for depressed mood and sleep disturbance. The patient is nervous/anxious.     PMFS History:  Patient Active Problem List     Diagnosis Date Noted  . IBS (irritable bowel syndrome) 10/18/2016  . Fibromyalgia 10/18/2016  . Fatigue 10/18/2016  . Primary insomnia 10/18/2016  . 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 Trochanteric bursitis of both hips 10/18/2016  . Primary osteoarthritis of both knees 10/18/2016  . 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 Bilateral calcaneal spurs 10/18/2016  . Vitamin D deficiency 10/18/2016  . UTI (urinary tract infection) 11/08/2014  . Allergic rhinitis 02/21/2013  . Migraine 02/21/2013    Past Medical History:  Diagnosis Date  . Allergy   . Fibromyalgia     Family History  Problem Relation Age of Onset  . Heart disease Brother   . Heart disease Son    Past Surgical History:  Procedure Laterality Date  . ABDOMINAL HYSTERECTOMY    . CESAREAN SECTION    . EYE SURGERY    . FRACTURE SURGERY    . TUBAL LIGATION     Social History   Social History Narrative  . No narrative on file     Objective: Vital Signs: BP (!) 152/87 (BP Location: Left Arm, Patient Position: Sitting, Cuff Size: Normal)   Pulse 63   Resp 14   Ht 5' 3.5" (1.613 m)   Wt 225 lb (102.1 kg)   LMP 04/02/2006   BMI 39.23 kg/m    Physical Exam  Constitutional: She is oriented to person, place, and time. She appears well-developed and well-nourished.  HENT:  Head: Normocephalic and atraumatic.  Eyes: Conjunctivae and EOM are normal.  Neck: Normal range of motion.  Cardiovascular: Normal rate, regular rhythm, normal heart sounds and intact distal pulses.  Pulmonary/Chest: Effort normal and breath sounds normal.  Abdominal: Soft. Bowel sounds are normal.  Lymphadenopathy:    She has no cervical adenopathy.  Neurological: She is alert and oriented to person, place, and time.  Skin: Skin is warm and dry. Capillary refill takes less than 2 seconds.  Psychiatric: She has a normal mood and affect. Her behavior is normal.  Nursing note and vitals reviewed.     Musculoskeletal Exam: C-spine and thoracic lumbar spine good range of motion. She is some trapezius spasm. Shoulder joints elbow joints wrist joint MCPs PIPs DIPs with good range of motion with no synovitis hip joints knee joints ankles MTPs PIPs are good range of motion. She had some crepitus with range of motion of her right knee joint. Tenderness was noted over the left trochanteric area consistent with trochanteric bursitis.  CDAI Exam: No CDAI exam completed.    Investigation: Findings:  05/2015 CBC showed hemoglobin of 11.0.  Platelets were 437 which appears to be reactive thrombocytosis.  Compressive metabolic panel was normal.  Sed rate was 22 which is within normal limits.  CK, TSH, rheumatoid factor, ANA complements, ACE level, HLA-B27, CCP and ENA were within normal limits.   In December of 2015, MRI of lumbar spine showed mild disk disease of lumbar spine with L3-4 and L4-5 narrowing.  I reviewed x-rays of her lumbar spine from February 2015, which showed mild spondylosis and SI joints were normal.   05/18/2015  We did obtain x-ray of bilateral hands 2 views today, which were within normal limits without any joint space narrowing or erosive changes.  No osteopenia was noted.  Bilateral feet x-rays 2 views were also within normal limits with left small calcaneal spur.  Bilateral knee joint x-rays showed bilateral moderate medial compartment narrowing without chondrocalcinosis and moderate patellofemoral narrowing, consistent with moderate osteoarthritis and moderate chondromalacia patella.    Imaging: No results found.  Speciality Comments: No specialty comments available.    Procedures:  Large Joint Inj Date/Time: 04/21/2017 9:54 AM Performed by: Bo Merino Authorized by: Bo Merino   Consent Given by:  Patient Site marked: the procedure site was marked   Timeout: prior to procedure the correct patient, procedure, and site was verified   Indications:   Pain Location:  Hip Site:  L greater trochanter Prep: patient was prepped and draped in usual sterile fashion   Needle Size:  27 G Needle Length:  1.5 inches Approach:  Lateral Ultrasound Guidance: No   Fluoroscopic Guidance: No   Arthrogram: No   Medications:  40 mg triamcinolone acetonide 40 MG/ML; 2 mL lidocaine 1 % Aspiration Attempted: No   Aspirate amount (mL):  0 Patient tolerance:  Patient tolerated the procedure well with no immediate complications   Allergies: Penicillins and Sulfa antibiotics   Assessment / Plan:     Visit Diagnoses: Primary osteoarthritis of both knees: She does have chronic discomfort in her knee joints. She is also trying weight loss and should be helpful long-term. Knee joints muscle strengthening exercises were given. Weight loss diet and exercise was encouraged.  Trochanteric bursitis of left hip: She's having tenderness in the left trochanteric area. After different treatment options were discussed per patient's request the left trochanteric area was injected as described above.  Chondromalacia, patella, unspecified laterality: Some discomfort  Bilateral calcaneal spurs  DDD lumbar spine: Chronic pain. She requested refill on tizanidine which was given.  Dry eyes - autoimmune workup is negative   Other fatigue  Primary insomnia  Vitamin D deficiency  History of IBS  History of migraine    Orders: Orders Placed This Encounter  Procedures  . Large Joint Injection/Arthrocentesis   Meds ordered this encounter  Medications  . tiZANidine (ZANAFLEX) 4 MG capsule    Sig: Take 1 capsule (4 mg total) by mouth at bedtime.    Dispense:  30 capsule    Refill:  5    Face-to-face time spent with patient was 30 minutes. 50% of time was spent in counseling and coordination of care.  Follow-Up Instructions: Return in about 6 months (around 10/22/2017) for Osteoarthritis.   Bo Merino, MD  Note - This record has been created using  Editor, commissioning.  Chart creation errors have been sought, but may not always  have been located. Such creation errors do not reflect on  the standard of medical care.

## 2017-04-21 ENCOUNTER — Ambulatory Visit (INDEPENDENT_AMBULATORY_CARE_PROVIDER_SITE_OTHER): Payer: 59 | Admitting: Rheumatology

## 2017-04-21 ENCOUNTER — Encounter (INDEPENDENT_AMBULATORY_CARE_PROVIDER_SITE_OTHER): Payer: Self-pay

## 2017-04-21 ENCOUNTER — Encounter: Payer: Self-pay | Admitting: Rheumatology

## 2017-04-21 VITALS — BP 152/87 | HR 63 | Resp 14 | Ht 63.5 in | Wt 225.0 lb

## 2017-04-21 DIAGNOSIS — E559 Vitamin D deficiency, unspecified: Secondary | ICD-10-CM

## 2017-04-21 DIAGNOSIS — Z8669 Personal history of other diseases of the nervous system and sense organs: Secondary | ICD-10-CM

## 2017-04-21 DIAGNOSIS — H04123 Dry eye syndrome of bilateral lacrimal glands: Secondary | ICD-10-CM

## 2017-04-21 DIAGNOSIS — M2240 Chondromalacia patellae, unspecified knee: Secondary | ICD-10-CM | POA: Diagnosis not present

## 2017-04-21 DIAGNOSIS — M7062 Trochanteric bursitis, left hip: Secondary | ICD-10-CM | POA: Diagnosis not present

## 2017-04-21 DIAGNOSIS — M47816 Spondylosis without myelopathy or radiculopathy, lumbar region: Secondary | ICD-10-CM

## 2017-04-21 DIAGNOSIS — M7731 Calcaneal spur, right foot: Secondary | ICD-10-CM

## 2017-04-21 DIAGNOSIS — M170 Bilateral primary osteoarthritis of knee: Secondary | ICD-10-CM | POA: Diagnosis not present

## 2017-04-21 DIAGNOSIS — Z8719 Personal history of other diseases of the digestive system: Secondary | ICD-10-CM

## 2017-04-21 DIAGNOSIS — F5101 Primary insomnia: Secondary | ICD-10-CM

## 2017-04-21 DIAGNOSIS — R5383 Other fatigue: Secondary | ICD-10-CM

## 2017-04-21 DIAGNOSIS — M7732 Calcaneal spur, left foot: Secondary | ICD-10-CM

## 2017-04-21 MED ORDER — TRIAMCINOLONE ACETONIDE 40 MG/ML IJ SUSP
40.0000 mg | INTRAMUSCULAR | Status: AC | PRN
  Administered 2017-04-21: 40 mg via INTRA_ARTICULAR

## 2017-04-21 MED ORDER — LIDOCAINE HCL 1 % IJ SOLN
2.0000 mL | INTRAMUSCULAR | Status: AC | PRN
  Administered 2017-04-21: 2 mL

## 2017-04-21 MED ORDER — TIZANIDINE HCL 4 MG PO CAPS: 4.0000 mg | ORAL_CAPSULE | Freq: Every day | ORAL | 5 refills | Status: DC

## 2017-04-21 NOTE — Patient Instructions (Signed)
Knee Exercises Ask your health care provider which exercises are safe for you. Do exercises exactly as told by your health care provider and adjust them as directed. It is normal to feel mild stretching, pulling, tightness, or discomfort as you do these exercises, but you should stop right away if you feel sudden pain or your pain gets worse.Do not begin these exercises until told by your health care provider. STRETCHING AND RANGE OF MOTION EXERCISES  These exercises warm up your muscles and joints and improve the movement and flexibility of your knee. These exercises also help to relieve pain, numbness, and tingling. Exercise A: Knee Extension, Prone  1. Lie on your abdomen on a bed. 2. Place your left / right knee just beyond the edge of the surface so your knee is not on the bed. You can put a towel under your left / right thigh just above your knee for comfort. 3. Relax your leg muscles and allow gravity to straighten your knee. You should feel a stretch behind your left / right knee. 4. Hold this position for __________ seconds. 5. Scoot up so your knee is supported between repetitions. Repeat __________ times. Complete this stretch __________ times a day. Exercise B: Knee Flexion, Active   1. Lie on your back with both knees straight. If this causes back discomfort, bend your left / right knee so your foot is flat on the floor. 2. Slowly slide your left / right heel back toward your buttocks until you feel a gentle stretch in the front of your knee or thigh. 3. Hold this position for __________ seconds. 4. Slowly slide your left / right heel back to the starting position. Repeat __________ times. Complete this exercise __________ times a day. Exercise C: Quadriceps, Prone   1. Lie on your abdomen on a firm surface, such as a bed or padded floor. 2. Bend your left / right knee and hold your ankle. If you cannot reach your ankle or pant leg, loop a belt around your foot and grab the belt  instead. 3. Gently pull your heel toward your buttocks. Your knee should not slide out to the side. You should feel a stretch in the front of your thigh and knee. 4. Hold this position for __________ seconds. Repeat __________ times. Complete this stretch __________ times a day. Exercise D: Hamstring, Supine  1. Lie on your back. 2. Loop a belt or towel over the ball of your left / right foot. The ball of your foot is on the walking surface, right under your toes. 3. Straighten your left / right knee and slowly pull on the belt to raise your leg until you feel a gentle stretch behind your knee.  Do not let your left / right knee bend while you do this.  Keep your other leg flat on the floor. 4. Hold this position for __________ seconds. Repeat __________ times. Complete this stretch __________ times a day. STRENGTHENING EXERCISES  These exercises build strength and endurance in your knee. Endurance is the ability to use your muscles for a long time, even after they get tired. Exercise E: Quadriceps, Isometric   1. Lie on your back with your left / right leg extended and your other knee bent. Put a rolled towel or small pillow under your knee if told by your health care provider. 2. Slowly tense the muscles in the front of your left / right thigh. You should see your kneecap slide up toward your hip or see   increased dimpling just above the knee. This motion will push the back of the knee toward the floor. 3. For __________ seconds, keep the muscle as tight as you can without increasing your pain. 4. Relax the muscles slowly and completely. Repeat __________ times. Complete this exercise __________ times a day. Exercise F: Straight Leg Raises - Quadriceps  1. Lie on your back with your left / right leg extended and your other knee bent. 2. Tense the muscles in the front of your left / right thigh. You should see your kneecap slide up or see increased dimpling just above the knee. Your thigh  may even shake a bit. 3. Keep these muscles tight as you raise your leg 4-6 inches (10-15 cm) off the floor. Do not let your knee bend. 4. Hold this position for __________ seconds. 5. Keep these muscles tense as you lower your leg. 6. Relax your muscles slowly and completely after each repetition. Repeat __________ times. Complete this exercise __________ times a day. Exercise G: Hamstring, Isometric  1. Lie on your back on a firm surface. 2. Bend your left / right knee approximately __________ degrees. 3. Dig your left / right heel into the surface as if you are trying to pull it toward your buttocks. Tighten the muscles in the back of your thighs to dig as hard as you can without increasing any pain. 4. Hold this position for __________ seconds. 5. Release the tension gradually and allow your muscles to relax completely for __________ seconds after each repetition. Repeat __________ times. Complete this exercise __________ times a day. Exercise H: Hamstring Curls   If told by your health care provider, do this exercise while wearing ankle weights. Begin with __________ weights. Then increase the weight by 1 lb (0.5 kg) increments. Do not wear ankle weights that are more than __________. 1. Lie on your abdomen with your legs straight. 2. Bend your left / right knee as far as you can without feeling pain. Keep your hips flat against the floor. 3. Hold this position for __________ seconds. 4. Slowly lower your leg to the starting position. Repeat __________ times. Complete this exercise __________ times a day. Exercise I: Squats (Quadriceps)  1. Stand in front of a table, with your feet and knees pointing straight ahead. You may rest your hands on the table for balance but not for support. 2. Slowly bend your knees and lower your hips like you are going to sit in a chair.  Keep your weight over your heels, not over your toes.  Keep your lower legs upright so they are parallel with the  table legs.  Do not let your hips go lower than your knees.  Do not bend lower than told by your health care provider.  If your knee pain increases, do not bend as low. 3. Hold the squat position for __________ seconds. 4. Slowly push with your legs to return to standing. Do not use your hands to pull yourself to standing. Repeat __________ times. Complete this exercise __________ times a day. Exercise J: Wall Slides (Quadriceps)   1. Lean your back against a smooth wall or door while you walk your feet out 18-24 inches (46-61 cm) from it. 2. Place your feet hip-width apart. 3. Slowly slide down the wall or door until your knees Repeat __________ times. Complete this exercise __________ times a day. 4. Exercise K: Straight Leg Raises - Hip Abductors  1. Lie on your side with your left / right leg   in the top position. Lie so your head, shoulder, knee, and hip line up. You may bend your bottom knee to help you keep your balance. 2. Roll your hips slightly forward so your hips are stacked directly over each other and your left / right knee is facing forward. 3. Leading with your heel, lift your top leg 4-6 inches (10-15 cm). You should feel the muscles in your outer hip lifting.  Do not let your foot drift forward.  Do not let your knee roll toward the ceiling. 4. Hold this position for __________ seconds. 5. Slowly return your leg to the starting position. 6. Let your muscles relax completely after each repetition. Repeat __________ times. Complete this exercise __________ times a day. Exercise L: Straight Leg Raises - Hip Extensors  1. Lie on your abdomen on a firm surface. You can put a pillow under your hips if that is more comfortable. 2. Tense the muscles in your buttocks and lift your left / right leg about 4-6 inches (10-15 cm). Keep your knee straight as you lift your leg. 3. Hold this position for __________ seconds. 4. Slowly lower your leg to the starting position. 5. Let  your leg relax completely after each repetition. Repeat __________ times. Complete this exercise __________ times a day. This information is not intended to replace advice given to you by your health care provider. Make sure you discuss any questions you have with your health care provider. Document Released: 10/16/2005 Document Revised: 08/26/2016 Document Reviewed: 10/08/2015 Else[employer] Interactive Patient Education  2017 [employer] Iliotibial Bursitis Rehab Ask your health care provider which exercises are safe for you. Do exercises exactly as told by your health care provider and adjust them as directed. It is normal to feel mild stretching, pulling, tightness, or discomfort as you do these exercises, but you should stop right away if you feel sudden pain or your pain gets worse.Do not begin these exercises until told by your health care provider. Stretching and range of motion exercises These exercises warm up your muscles and joints and improve the movement and flexibility of your leg. These exercises also help to relieve pain and stiffness. Exercise A: Quadriceps stretch, prone   6. Lie on your abdomen on a firm surface, such as a bed or padded floor. 7. Bend your left / right knee and hold your ankle. If you cannot reach your ankle or pant leg, loop a belt around your foot and grab the belt instead. 8. Gently pull your heel toward your buttocks. Your knee should not slide out to the side. You should feel a stretch in the front of your thigh and knee. 9. Hold this position for __________ seconds. Repeat __________ times. Complete this exercise __________ times a day. Exercise B: Lunge (  adductor stretch) 5. Stand and spread your legs about 3 feet (about 1 m) apart. Put your left / right leg slightly back for balance. 6. Lean away from your left / right leg by bending your other knee and shifting your weight toward your bent knee. You may rest your hands on your thigh for balance. You  should feel a stretch in your left / right inner thigh. 7. Hold for __________ seconds. Repeat __________ times. Complete this exercise __________ times a day. Exercise C: Hamstring stretch, supine  1. Lie on your back. 2. Hold both ends of a belt or towel as you loop it over the ball of your left / right foot. The ball of your foot  is on the walking surface, right under your toes. 3. Straighten your left / right knee and slowly pull on the belt to raise your leg. Stop when you feel a gentle stretch in the back of your left / right knee or thigh.  Do not let your left / right knee bend.  Keep your other leg flat on the floor. 4. Hold this position for __________ seconds. Repeat __________ times. Complete this exercise __________ times a day. Strengthening exercises These exercises build strength and endurance in your leg. Endurance is the ability to use your muscles for a long time, even after they get tired. Exercise D: Quadriceps wall slides  1. Lean your back against a smooth wall or door while you walk your feet out 18-24 inches (46-61 cm) from it. 2. Place your feet hip-width apart. 3. Slowly slide down the wall or door until your knees bend as far as told by your health care provider. Keep your knees over your heels, not your toes. Keep your knees in line with your hips. 4. Hold for __________ seconds. 5. Push through your heels to stand up to rest for __________ seconds after each repetition. Repeat __________ times. Complete this exercise __________ times a day. Exercise E: Straight leg raises ( hip abductors) 5. Lie on your side, with your left / right leg in the top position. Lie so your head, shoulder, knee, and hip line up with each other. You may bend your bottom knee to help you balance. 6. Lift your top leg 4-6 inches (10-15 cm) while keeping your toes pointed straight ahead. 7. Hold this position for __________ seconds. 8. Slowly lower your leg to the starting position.  Allow your muscles to relax completely after each repetition. Repeat __________ times. Complete this exercise __________ times a day. Exercise F: Straight leg raises ( hip extensors) 7. Lie on your abdomen on a firm surface. You can put a pillow under your hips if that is more comfortable. 8. Tense the muscles in your buttocks and lift your left / right leg about 4-6 inches (10-15 cm). Keep your knee straight as you lift your leg. 9. Hold this position for __________ seconds. 10. Slowly lower your leg to the starting position. 11. Let your leg relax completely after each repetition. Repeat __________ times. Complete this exercise __________ times a day. Exercise G: Bridge ( hip extensors) 1. Lie on your back on a firm surface with your knees bent and your feet flat on the floor. 2. Tighten your buttocks muscles and lift your bottom off the floor until your trunk is level with your thighs.  Do not arch your back.  You should feel the muscles working in your buttocks and the back of your thighs. If you do not feel these muscles, slide your feet 1-2 inches (2.5-5 cm) farther away from your buttocks. 3. Hold this position for __________ seconds. 4. Slowly lower your hips to the starting position. 5. Let your buttocks muscles relax completely between repetitions. 6. If this exercise is too easy, try doing it with your arms crossed over your chest. Repeat __________ times. Complete this exercise __________ times a day. This information is not intended to replace advice given to you by your health care provider. Make sure you discuss any questions you have with your health care provider. Document Released: 12/02/2005 Document Revised: 08/08/2016 Document Reviewed: 11/14/2015 Elsevier Interactive Patient Education  2017 [employer]

## 2017-10-07 NOTE — Progress Notes (Signed)
Office Visit Note  Patient: Christine [name]             Date of Birth: 27-Apr-1966           MRN: [id number]             PCP: Martha Clan, MD Referring: Martha Clan, MD Visit Date: 10/20/2017 Occupation: @GUAROCC @    Subjective:  back pain, knee pain, muscle spasms.   History of Present Illness: Christine [name] is a 51 y.o. female with history of osteoarthritis, DDD and fibromyalgia syndrome. According to her she's been having some pain in the thoracic and lumbar region of her spine. There is no radiculopathy. She's been also having some discomfort in her right knee. She had an injury in the past. She's been having some muscle spasms in her right lower extremity. Her fibromyalgia is fairly well controlled. She's been walking on regular basis.  Activities of Daily Living:  Patient reports morning stiffness for 5 minutes.   Patient Reports nocturnal pain.  Difficulty dressing/grooming: Denies Difficulty climbing stairs: Reports Difficulty getting out of chair: Reports Difficulty using hands for taps, buttons, cutlery, and/or writing: Denies   Review of Systems  Constitutional: Positive for fatigue. Negative for night sweats, weight gain, weight loss and weakness.  HENT: Positive for mouth dryness. Negative for mouth sores, trouble swallowing, trouble swallowing and nose dryness.   Eyes: Negative for pain, redness, visual disturbance and dryness.  Respiratory: Negative for cough, shortness of breath and difficulty breathing.   Cardiovascular: Negative for chest pain, palpitations, hypertension, irregular heartbeat and swelling in legs/feet.  Gastrointestinal: Negative for blood in stool, constipation and diarrhea.  Endocrine: Negative for increased urination.  Genitourinary: Negative for vaginal dryness.  Musculoskeletal: Positive for arthralgias, joint pain, myalgias, morning stiffness and myalgias. Negative for joint swelling, muscle weakness and muscle tenderness.    Skin: Negative for color change, rash, hair loss, skin tightness, ulcers and sensitivity to sunlight.  Allergic/Immunologic: Negative for susceptible to infections.  Neurological: Negative for dizziness, memory loss and night sweats.  Hematological: Negative for swollen glands.  Psychiatric/Behavioral: Positive for sleep disturbance. Negative for depressed mood. The patient is not nervous/anxious.     PMFS History:  Patient Active Problem List   Diagnosis Date Noted  . IBS (irritable bowel syndrome) 10/18/2016  . Fibromyalgia 10/18/2016  . Fatigue 10/18/2016  . Primary insomnia 10/18/2016  . 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 Trochanteric bursitis of both hips 10/18/2016  . Primary osteoarthritis of both knees 10/18/2016  . 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 Bilateral calcaneal spurs 10/18/2016  . Vitamin D deficiency 10/18/2016  . UTI (urinary tract infection) 11/08/2014  . Allergic rhinitis 02/21/2013  . Migraine 02/21/2013    Past Medical History:  Diagnosis Date  . Allergy   . Fibromyalgia     Family History  Problem Relation Age of Onset  . Heart disease Brother   . Scoliosis Son    Past Surgical History:  Procedure Laterality Date  . ABDOMINAL HYSTERECTOMY    . CESAREAN SECTION    . FRACTURE SURGERY    . TUBAL LIGATION     Social History   Social History Narrative  . Not on file     Objective: Vital Signs: BP (!) 142/82 (BP Location: Left Arm, Patient Position: Sitting, Cuff Size: Normal)   Pulse 69   Ht 5\' 3"  (1.6 m)   Wt 206 lb (93.4 kg)   LMP 04/02/2006   BMI 36.49 kg/m  Physical Exam  Constitutional: She is oriented to person, place, and time. She appears well-developed and well-nourished.  HENT:  Head: Normocephalic and atraumatic.  Eyes: Conjunctivae and EOM are normal.  Neck: Normal range of motion.  Cardiovascular: Normal rate, regular rhythm, normal heart sounds and intact distal pulses.   Pulmonary/Chest: Effort normal and breath sounds normal.  Abdominal: Soft. Bowel sounds are normal.  Lymphadenopathy:    She has no cervical adenopathy.  Neurological: She is alert and oriented to person, place, and time.  Skin: Skin is warm and dry. Capillary refill takes less than 2 seconds.  Psychiatric: She has a normal mood and affect. Her behavior is normal.  Nursing note and vitals reviewed.    Musculoskeletal Exam: C-spine good range of motion. She is some tenderness over the lower thoracic region. She is tenderness over the lower lumbar region. She has limited range of motion of her thoracic and lumbar spine. Shoulder joints elbow joints wrist joint MCPs PIPs DIPs with good range of motion with no synovitis. Hip joints knee joints ankles MTPs PIPs DIPs with good range of motion with no synovitis.fibromyalgia tender points with 12 out of 18 positive.  CDAI Exam: No CDAI exam completed.    Investigation: No additional findings.   Imaging: Xr Lumbar Spine 2-3 Views  Result Date: 10/20/2017 No significant disc space narrowing was noted. Mild facet joint arthropathy was noted.   Speciality Comments: No specialty comments available.    Procedures:  No procedures performed Allergies: Penicillins and Sulfa antibiotics   Assessment / Plan:     Visit Diagnoses: Chronic pain of right knee: She continues to have discomfort in the right knee joint. She has history of meniscal injury in the past. There was no warmth swelling or effusion was noted. Weight loss diet and exercise was discussed.  Primary osteoarthritis of both knees: Chronic pain  Chondromalacia, patella, unspecified laterality  Thoracic pain: I will refer her to physical therapy.  Chronic midline low back pain without sciatica - Plan: XR Lumbar Spine 2-3 Views,no significant disc space narrowing was noted. Mild facet joint arthropathy was noted. Ambulatory referral to Physical Therapy  DDD (degenerative disc  disease), lumbar - Plan: Ambulatory referral to Physical Therapy  Bilateral calcaneal spurs: Currently not bothersome.  Fibromyalgia: She continues to have some generalized pain and discomfort and positive tender points. She's been having increased muscle spasm. Prescription refill for methocarbamol and tizanidine was given.   Other fatigue: Due to insomnia  History of insomnia: Good sleep hygiene was discussed.  Dry eyes - autoimmune workup is negative . She's been using Restasis.  History of vitamin D deficiency. She is on vitamin D supplement.  History of migraine  History of IBS    Orders: Orders Placed This Encounter  Procedures  . XR Lumbar Spine 2-3 Views  . Ambulatory referral to Physical Therapy   Meds ordered this encounter  Medications  . tiZANidine (ZANAFLEX) 4 MG capsule    Sig: Take 1 capsule (4 mg total) at bedtime by mouth.    Dispense:  30 capsule    Refill:  5  . methocarbamol (ROBAXIN) 500 MG tablet    Sig: Take 1 tablet (500 mg total) 2 (two) times daily with breakfast and lunch by mouth.    Dispense:  60 tablet    Refill:  5    Face-to-face time spent with patient was 30 minutes.greater than 50% of time was spent in counseling and coordination of care.  Follow-Up Instructions: Return in  about 6 months (around 04/19/2018) for Osteoarthritis, DDD, FMS.   Pollyann SavoyShaili David Towson, MD  Note - This record has been created using [employer] software.  Chart creation errors have been sought, but may not always  have been located. Such creation errors do not reflect on  the standard of medical care.

## 2017-10-20 ENCOUNTER — Encounter: Payer: Self-pay | Admitting: Rheumatology

## 2017-10-20 ENCOUNTER — Ambulatory Visit: Payer: 59 | Admitting: Rheumatology

## 2017-10-20 ENCOUNTER — Ambulatory Visit (INDEPENDENT_AMBULATORY_CARE_PROVIDER_SITE_OTHER): Payer: 59

## 2017-10-20 VITALS — BP 142/82 | HR 69 | Ht 63.0 in | Wt 206.0 lb

## 2017-10-20 DIAGNOSIS — M545 Low back pain: Secondary | ICD-10-CM

## 2017-10-20 DIAGNOSIS — M7731 Calcaneal spur, right foot: Secondary | ICD-10-CM

## 2017-10-20 DIAGNOSIS — M5136 Other intervertebral disc degeneration, lumbar region: Secondary | ICD-10-CM

## 2017-10-20 DIAGNOSIS — H04123 Dry eye syndrome of bilateral lacrimal glands: Secondary | ICD-10-CM | POA: Diagnosis not present

## 2017-10-20 DIAGNOSIS — Z8639 Personal history of other endocrine, nutritional and metabolic disease: Secondary | ICD-10-CM | POA: Diagnosis not present

## 2017-10-20 DIAGNOSIS — G8929 Other chronic pain: Secondary | ICD-10-CM

## 2017-10-20 DIAGNOSIS — M170 Bilateral primary osteoarthritis of knee: Secondary | ICD-10-CM | POA: Diagnosis not present

## 2017-10-20 DIAGNOSIS — Z8669 Personal history of other diseases of the nervous system and sense organs: Secondary | ICD-10-CM | POA: Diagnosis not present

## 2017-10-20 DIAGNOSIS — M797 Fibromyalgia: Secondary | ICD-10-CM

## 2017-10-20 DIAGNOSIS — M2240 Chondromalacia patellae, unspecified knee: Secondary | ICD-10-CM | POA: Diagnosis not present

## 2017-10-20 DIAGNOSIS — R5383 Other fatigue: Secondary | ICD-10-CM

## 2017-10-20 DIAGNOSIS — M25561 Pain in right knee: Secondary | ICD-10-CM | POA: Diagnosis not present

## 2017-10-20 DIAGNOSIS — Z8719 Personal history of other diseases of the digestive system: Secondary | ICD-10-CM

## 2017-10-20 DIAGNOSIS — Z87898 Personal history of other specified conditions: Secondary | ICD-10-CM | POA: Diagnosis not present

## 2017-10-20 DIAGNOSIS — M7732 Calcaneal spur, left foot: Secondary | ICD-10-CM

## 2017-10-20 MED ORDER — METHOCARBAMOL 500 MG PO TABS: 500.0000 mg | ORAL_TABLET | Freq: Two times a day (BID) | ORAL | 5 refills | Status: DC

## 2017-10-20 MED ORDER — TIZANIDINE HCL 4 MG PO CAPS: 4.0000 mg | ORAL_CAPSULE | Freq: Every day | ORAL | 5 refills | Status: DC

## 2017-10-20 NOTE — Patient Instructions (Signed)

## 2018-04-09 NOTE — Progress Notes (Signed)
Office Visit Note  Patient: Christine [name]             Date of Birth: 30-Jan-1966           MRN: [id number]             PCP: Martha Clan, MD Referring: Martha Clan, MD Visit Date: 04/23/2018 Occupation: @GUAROCC @    Subjective:  Generalized pain   History of Present Illness: Christine [name] is a 52 y.o. female with history of osteoarthritis, fibromyalgia and DDD.  Patient has not been having more frequent fibromyalgia flares.  She is having generalized muscle aches and muscle tenderness.  She is also having pain in multiple joints.  She states that her fiber has been flaring due to the frequent weather changes.  She states she is also been having more tension headaches and has been taking Advil for pain relief.  She denies any nausea or blurry vision.  She states that occasionally she will have a migraine to take Excedrin.  She states that he is going to physical therapy helped her pain significantly.  She is also going to massages every few weeks.  She states that occasionally she will wake up with pain at night and had to make positional changes.  She continues to take Ambien 10 mg at bedtime as well as Zanaflex 4 mg at bedtime.  She is also been stretching and biking or walking for exercise on a regular basis.  She denies any joint swelling.  She continues to have stiffness and cramping in her bilateral hands.    Activities of Daily Living:  Patient reports morning stiffness for 1-2 hours.   Patient Reports nocturnal pain.  Difficulty dressing/grooming: Denies Difficulty climbing stairs: Denies Difficulty getting out of chair: Denies Difficulty using hands for taps, buttons, cutlery, and/or writing: Denies   Review of Systems  Constitutional: Positive for fatigue.  HENT: Negative for mouth sores, mouth dryness and nose dryness.   Eyes: Positive for dryness. Negative for pain and visual disturbance.  Respiratory: Negative for cough, hemoptysis, shortness of breath and  difficulty breathing.   Cardiovascular: Negative for chest pain, palpitations, hypertension and swelling in legs/feet.  Gastrointestinal: Negative for blood in stool, constipation and diarrhea.  Endocrine: Negative for increased urination.  Genitourinary: Negative for painful urination.  Musculoskeletal: Positive for arthralgias, joint pain and muscle tenderness. Negative for joint swelling, myalgias, muscle weakness, morning stiffness and myalgias.  Skin: Negative for color change, pallor, rash, hair loss, nodules/bumps, skin tightness, ulcers and sensitivity to sunlight.  Allergic/Immunologic: Negative for susceptible to infections.  Neurological: Positive for headaches. Negative for dizziness, numbness and weakness.  Hematological: Negative for swollen glands.  Psychiatric/Behavioral: Positive for sleep disturbance. Negative for depressed mood. The patient is not nervous/anxious.     PMFS History:  Patient Active Problem List   Diagnosis Date Noted  . IBS (irritable bowel syndrome) 10/18/2016  . Fibromyalgia 10/18/2016  . Fatigue 10/18/2016  . Primary insomnia 10/18/2016  . 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 Trochanteric bursitis of both hips 10/18/2016  . Primary osteoarthritis of both knees 10/18/2016  . 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 Bilateral calcaneal spurs 10/18/2016  . Vitamin D deficiency 10/18/2016  . UTI (urinary tract infection) 11/08/2014  . Allergic rhinitis 02/21/2013  . Migraine 02/21/2013    Past Medical History:  Diagnosis Date  . Allergy   . Fibromyalgia     Family History  Problem Relation Age of Onset  . Heart disease Brother   .  Scoliosis Son    Past Surgical History:  Procedure Laterality Date  . ABDOMINAL HYSTERECTOMY    . CESAREAN SECTION    . FRACTURE SURGERY    . TUBAL LIGATION     Social History   Social History Narrative  . Not on file     Objective: Vital Signs: BP (!) 170/96 (BP Location:  Right Arm, Patient Position: Sitting, Cuff Size: Normal)   Pulse 65   Resp 14   Ht 5\' 3"  (1.6 m)   Wt 220 lb (99.8 kg)   LMP 04/02/2006   BMI 38.97 kg/m    Physical Exam  Constitutional: She is oriented to person, place, and time. She appears well-developed and well-nourished.  HENT:  Head: Normocephalic and atraumatic.  Eyes: Conjunctivae and EOM are normal.  Neck: Normal range of motion.  Cardiovascular: Normal rate, regular rhythm, normal heart sounds and intact distal pulses.  Pulmonary/Chest: Effort normal and breath sounds normal.  Abdominal: Soft. Bowel sounds are normal.  Lymphadenopathy:    She has no cervical adenopathy.  Neurological: She is alert and oriented to person, place, and time.  Skin: Skin is warm and dry. Capillary refill takes less than 2 seconds.  Psychiatric: She has a normal mood and affect. Her behavior is normal.  Nursing note and vitals reviewed.    Musculoskeletal Exam: C-spine, thoracic spine, lumbar spine good range of motion.  No midline spinal tenderness.  No SI joint tenderness.  Shoulder joints, elbow joints, wrist joints, MCPs, PIPs, DIPs good range of motion with no synovitis.  Hip joints, knee joints, ankle joints, MTPs, PIPs, DIPs good range of motion with no synovitis.  She has tenderness of bilateral trochanteric bursa.  No warmth or effusion of the joints.  CDAI Exam: No CDAI exam completed.    Investigation: No additional findings.  CBC Latest Ref Rng & Units 02/21/2013 06/30/2008 06/27/2008  WBC 4.6 - 10.2 K/uL 4.4(A) 9.1 7.8  Hemoglobin 12.2 - 16.2 g/dL 16.114.7 09.613.1 04.514.9  Hematocrit 37.7 - 47.9 % 47.0 38.2 43.5  Platelets - - 303 314   No flowsheet data found.  Imaging: No results found.  Speciality Comments: No specialty comments available.    Procedures:  No procedures performed Allergies: Penicillins and Sulfa antibiotics   Assessment / Plan:     Visit Diagnoses: Primary osteoarthritis of both knees: She is good range of  motion bilateral knee joints.  She has no knee crepitus.  She has no warmth or effusion.  She has occasional discomfort in her right knee especially if she is walking for prolonged periods of time.  She has been performing stretching and lunging exercises.  She is also been walking and biking for exercise.  Trochanteric bursitis of both hips: She has tenderness of bilateral trochanteric bursa.  She is in a physical therapy which helped her pain significantly.  She performs stretching exercises on a regular basis.  DDD (degenerative disc disease), lumbar -she has no midline spinal tenderness.  She has good range of motion of her lumbar spine.  She was referred to physical therapy which has helped her lower back pain significantly.   Fibromyalgia: She has generalized muscle tenderness and muscle aches due to fibromyalgia.  She has been having increased number of flares due to the frequent weather changes.  She has been getting massages every few weeks which has been helping with her muscle tension.  She is also been exercising on a regular basis.  She continues to take Robaxin 500 mg  twice daily as well as Zanaflex 4 mg at bedtime which help with her muscle aches and muscle spasms.  Refills of Robaxin and Zanaflex were sent to the pharmacy today.  Other fatigue: She has chronic fatigue that is related to insomnia.  She was encouraged to continue to exercise on a regular basis.  Primary insomnia: She takes Ambien 10 mg at bedtime.  She also takes Zanaflex 4 mg at bedtime which help with her muscle spasms.  Sleep hygiene was discussed.  Vitamin D deficiency: She takes vitamin D 1000 units daily.  Bilateral calcaneal spurs: She has no pain in her feet at this time.  She wears proper fitting shoes.  Other medical conditions are listed as follows:  History of IBS  History of migraine    Orders: No orders of the defined types were placed in this encounter.  Meds ordered this encounter  Medications   . zolpidem (AMBIEN) 10 MG tablet    Sig: Take 1 tablet (10 mg total) by mouth at bedtime as needed for sleep.    Dispense:  30 tablet    Refill:  3  . methocarbamol (ROBAXIN) 500 MG tablet    Sig: Take 1 tablet (500 mg total) by mouth 2 (two) times daily with breakfast and lunch.    Dispense:  60 tablet    Refill:  5    Face-to-face time spent with patient was 30 minutes. >50% of time was spent in counseling and coordination of care.  Follow-Up Instructions: Return in about 6 months (around 10/24/2018) for Osteoarthritis, Fibromyalgia, DDD.   Gearldine Bienenstock, PA-C  Note - This record has been created using Dragon software.  Chart creation errors have been sought, but may not always  have been located. Such creation errors do not reflect on  the standard of medical care.

## 2018-04-20 ENCOUNTER — Ambulatory Visit: Payer: 59 | Admitting: Rheumatology

## 2018-04-23 ENCOUNTER — Ambulatory Visit: Payer: 59 | Admitting: Physician Assistant

## 2018-04-23 ENCOUNTER — Encounter (INDEPENDENT_AMBULATORY_CARE_PROVIDER_SITE_OTHER): Payer: Self-pay

## 2018-04-23 ENCOUNTER — Encounter: Payer: Self-pay | Admitting: Physician Assistant

## 2018-04-23 VITALS — BP 170/96 | HR 65 | Resp 14 | Ht 63.0 in | Wt 220.0 lb

## 2018-04-23 DIAGNOSIS — F5101 Primary insomnia: Secondary | ICD-10-CM | POA: Diagnosis not present

## 2018-04-23 DIAGNOSIS — M7062 Trochanteric bursitis, left hip: Secondary | ICD-10-CM

## 2018-04-23 DIAGNOSIS — M5136 Other intervertebral disc degeneration, lumbar region: Secondary | ICD-10-CM | POA: Diagnosis not present

## 2018-04-23 DIAGNOSIS — M7731 Calcaneal spur, right foot: Secondary | ICD-10-CM | POA: Diagnosis not present

## 2018-04-23 DIAGNOSIS — E559 Vitamin D deficiency, unspecified: Secondary | ICD-10-CM

## 2018-04-23 DIAGNOSIS — Z8719 Personal history of other diseases of the digestive system: Secondary | ICD-10-CM | POA: Diagnosis not present

## 2018-04-23 DIAGNOSIS — R5383 Other fatigue: Secondary | ICD-10-CM | POA: Diagnosis not present

## 2018-04-23 DIAGNOSIS — M7732 Calcaneal spur, left foot: Secondary | ICD-10-CM | POA: Diagnosis not present

## 2018-04-23 DIAGNOSIS — M7061 Trochanteric bursitis, right hip: Secondary | ICD-10-CM | POA: Diagnosis not present

## 2018-04-23 DIAGNOSIS — M170 Bilateral primary osteoarthritis of knee: Secondary | ICD-10-CM | POA: Diagnosis not present

## 2018-04-23 DIAGNOSIS — M797 Fibromyalgia: Secondary | ICD-10-CM

## 2018-04-23 DIAGNOSIS — Z8669 Personal history of other diseases of the nervous system and sense organs: Secondary | ICD-10-CM | POA: Diagnosis not present

## 2018-04-23 DIAGNOSIS — M51369 Other intervertebral disc degeneration, lumbar region without mention of lumbar back pain or lower extremity pain: Secondary | ICD-10-CM

## 2018-04-23 MED ORDER — ZOLPIDEM TARTRATE 10 MG PO TABS: 10.0000 mg | ORAL_TABLET | Freq: Every evening | ORAL | 3 refills | Status: DC | PRN

## 2018-04-23 MED ORDER — METHOCARBAMOL 500 MG PO TABS: 500.0000 mg | ORAL_TABLET | Freq: Two times a day (BID) | ORAL | 5 refills | Status: DC

## 2018-05-07 ENCOUNTER — Encounter (HOSPITAL_COMMUNITY): Payer: Self-pay | Admitting: Emergency Medicine

## 2018-05-07 ENCOUNTER — Ambulatory Visit (HOSPITAL_COMMUNITY)
Admission: EM | Admit: 2018-05-07 | Discharge: 2018-05-07 | Disposition: A | Discharge status: Home / Self Care | Payer: 59 | Attending: Family Medicine | Admitting: Family Medicine

## 2018-05-07 DIAGNOSIS — J069 Acute upper respiratory infection, unspecified: Secondary | ICD-10-CM

## 2018-05-07 DIAGNOSIS — B9789 Other viral agents as the cause of diseases classified elsewhere: Secondary | ICD-10-CM

## 2018-05-07 MED ORDER — BENZONATATE 100 MG PO CAPS: 200.0000 mg | ORAL_CAPSULE | Freq: Three times a day (TID) | ORAL | 0 refills | Status: DC | PRN

## 2018-05-07 MED ORDER — LEVOCETIRIZINE DIHYDROCHLORIDE 5 MG PO TABS: 5.0000 mg | ORAL_TABLET | Freq: Every evening | ORAL | 0 refills | Status: DC

## 2018-05-07 MED ORDER — IPRATROPIUM BROMIDE 0.06 % NA SOLN: 2.0000 | Freq: Four times a day (QID) | NASAL | 12 refills | Status: DC

## 2018-05-07 NOTE — Discharge Instructions (Signed)
Push fluids to ensure adequate hydration and keep secretions thin.  Tylenol and/or ibuprofen as needed for pain or fevers.  Iprtropium nasal spray 2-4 times a day to help with nasal congestion and drainage. Nightly Xyzal may also help somewhat with congestion. May use tessalon as needed for cough, if this is not covered and expensive may use over the counter cough medication as well.  If symptoms worsen or do not improve in the next week to return to be seen or to follow up with your PCP.

## 2018-05-07 NOTE — ED Triage Notes (Signed)
PT reports congestion that started Monday. Cough, chest congestion, fever started Tuesday. PT has had ibuprofen this AM.

## 2018-05-07 NOTE — ED Provider Notes (Signed)
[facility]    CSN: [id number] Arrival date & time: 05/07/18  1031     History   Chief Complaint Chief Complaint  Patient presents with  . URI    HPI Christine [name] is a 52 y.o. female.   Christine [name] presents with complaints of post nasal drip which is causing cough and throat clearing, as well as fevers, which started 5/20. Has not improved. advil has helped with fevers, last at 0530 this am. Symptoms feel worse at night. Slight shortness of breath  With lying down due to drainage. No ear pain. Slight sore throat. Has been taking soft foods and drinking fluids. Without abdominal pain, nausea, vomiting, diarrhea. No known ill contacts. Has not been taking any medications for allergies, history of allergies, fibromyalgia, OA, migraines.     ROS per HPI.      Past Medical History:  Diagnosis Date  . Allergy   . Fibromyalgia     Patient Active Problem List   Diagnosis Date Noted  . IBS (irritable bowel syndrome) 10/18/2016  . Fibromyalgia 10/18/2016  . Fatigue 10/18/2016  . Primary insomnia 10/18/2016  . 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 Trochanteric bursitis of both hips 10/18/2016  . Primary osteoarthritis of both knees 10/18/2016  . 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 Bilateral calcaneal spurs 10/18/2016  . Vitamin D deficiency 10/18/2016  . UTI (urinary tract infection) 11/08/2014  . Allergic rhinitis 02/21/2013  . Migraine 02/21/2013    Past Surgical History:  Procedure Laterality Date  . ABDOMINAL HYSTERECTOMY    . CESAREAN SECTION    . FRACTURE SURGERY    . TUBAL LIGATION      OB History   None      Home Medications    Prior to Admission medications   Medication Sig Start Date End Date Taking? Authorizing Provider  benzonatate (TESSALON) 100 MG capsule Take 2 capsules (200 mg total) by mouth 3 (three) times daily as needed for cough. 05/07/18   Georgetta Haber, NP  cholecalciferol (VITAMIN D)  1000 UNITS tablet Take 1,000 Units by mouth daily.    [provider]  dicyclomine (BENTYL) 10 MG/5ML syrup Take 20 mg by mouth as needed.     [provider]  Ferrous Gluconate (IRON 27 PO) Take by mouth as needed.    [provider]  fish oil-omega-3 fatty acids 1000 MG capsule Take 2 g by mouth daily.    [provider]  ipratropium (ATROVENT) 0.06 % nasal spray Place 2 sprays into both nostrils 4 (four) times daily. 05/07/18   Georgetta Haber, NP  levocetirizine (XYZAL) 5 MG tablet Take 1 tablet (5 mg total) by mouth every evening. 05/07/18   Linus Mako B, NP  Melatonin 3 MG CAPS Take 3 mg by mouth at bedtime.    [provider]  methocarbamol (ROBAXIN) 500 MG tablet Take 1 tablet (500 mg total) by mouth 2 (two) times daily with breakfast and lunch. 04/23/18   Gearldine Bienenstock, PA-C  RESTASIS MULTIDOSE 0.05 % ophthalmic emulsion INT 1 GTT INTO OU BID 03/06/17   [provider]  tiZANidine (ZANAFLEX) 4 MG capsule Take 1 capsule (4 mg total) at bedtime by mouth. 10/20/17   Pollyann Savoy, MD  TURMERIC PO Take 900 mg by mouth daily.    [provider]  zolpidem (AMBIEN) 10 MG tablet Take 1 tablet (10 mg total) by mouth at bedtime as needed for sleep. 04/23/18   Sherron Ales  M, PA-C    Family History Family History  Problem Relation Age of Onset  . Heart disease Brother   . Scoliosis Son     Social History Social History   Tobacco Use  . Smoking status: Former Smoker    Packs/day: 0.25    Years: 2.00    Pack years: 0.50    Types: Cigarettes    Last attempt to quit: 02/22/1988    Years since quitting: 30.2  . Smokeless tobacco: Never Used  Substance Use Topics  . Alcohol use: No  . Drug use: No     Allergies   Penicillins and Sulfa antibiotics   Review of Systems Review of Systems   Physical Exam Triage Vital Signs ED Triage Vitals  Enc Vitals Group     BP 05/07/18 1105 (!) 143/65     Pulse Rate 05/07/18  1105 92     Resp 05/07/18 1105 16     Temp 05/07/18 1105 99 F (37.2 C)     Temp Source 05/07/18 1105 Oral     SpO2 05/07/18 1105 98 %     Weight 05/07/18 1106 210 lb (95.3 kg)     Height --      Head Circumference --      Peak Flow --      Pain Score 05/07/18 1106 7     Pain Loc --      Pain Edu? --      Excl. in GC? --    No data found.  Updated Vital Signs BP (!) 143/65   Pulse 92   Temp 99 F (37.2 C) (Oral)   Resp 16   Wt 210 lb (95.3 kg)   LMP 04/02/2006   SpO2 98%   BMI 37.20 kg/m   Visual Acuity Right Eye Distance:   Left Eye Distance:   Bilateral Distance:    Right Eye Near:   Left Eye Near:    Bilateral Near:     Physical Exam  Constitutional: She is oriented to person, place, and time. She appears well-developed and well-nourished. No distress.  HENT:  Head: Normocephalic and atraumatic.  Right Ear: Tympanic membrane, external ear and ear canal normal.  Left Ear: Tympanic membrane, external ear and ear canal normal.  Nose: Rhinorrhea present. Right sinus exhibits no maxillary sinus tenderness and no frontal sinus tenderness. Left sinus exhibits no maxillary sinus tenderness and no frontal sinus tenderness.  Mouth/Throat: Uvula is midline, oropharynx is clear and moist and mucous membranes are normal. No tonsillar exudate.  Eyes: Pupils are equal, round, and reactive to light. Conjunctivae and EOM are normal.  Cardiovascular: Normal rate, regular rhythm and normal heart sounds.  Pulmonary/Chest: Effort normal and breath sounds normal.  Without cough during exam   Neurological: She is alert and oriented to person, place, and time.  Skin: Skin is warm and dry.     UC Treatments / Results  Labs (all labs ordered are listed, but only abnormal results are displayed) Labs Reviewed - No data to display  EKG None  Radiology No results found.  Procedures Procedures (including critical care time)  Medications Ordered in UC Medications - No data to  display  Initial Impression / Assessment and Plan / UC Course  I have reviewed the triage vital signs and the nursing notes.  Pertinent labs & imaging results that were available during my care of the patient were reviewed by me and considered in my medical decision making (see chart for details).  Benign physical exam. Non toxic in appearance. Afebrile with use of ibuprofen. Without increased work of breathing, tachypnea, tachycardia, hypoxia. History and physical consistent with viral illness.  Supportive cares recommended. Nasal spray, allergy and cough treatment recommended. Return precautions provided. Patient verbalized understanding and agreeable to plan.    Final Clinical Impressions(s) / UC Diagnoses   Final diagnoses:  Viral URI with cough     Discharge Instructions     Push fluids to ensure adequate hydration and keep secretions thin.  Tylenol and/or ibuprofen as needed for pain or fevers.  Iprtropium nasal spray 2-4 times a day to help with nasal congestion and drainage. Nightly Xyzal may also help somewhat with congestion. May use tessalon as needed for cough, if this is not covered and expensive may use over the counter cough medication as well.  If symptoms worsen or do not improve in the next week to return to be seen or to follow up with your PCP.     ED Prescriptions    Medication Sig Dispense Auth. Provider   ipratropium (ATROVENT) 0.06 % nasal spray Place 2 sprays into both nostrils 4 (four) times daily. 15 mL Linus Mako B, NP   levocetirizine (XYZAL) 5 MG tablet Take 1 tablet (5 mg total) by mouth every evening. 30 tablet Linus Mako B, NP   benzonatate (TESSALON) 100 MG capsule Take 2 capsules (200 mg total) by mouth 3 (three) times daily as needed for cough. 21 capsule Georgetta Haber, NP     Controlled Substance Prescriptions [place] Controlled Substance Registry consulted? Not Applicable   Georgetta Haber, NP 05/07/18 1154

## 2018-06-04 ENCOUNTER — Other Ambulatory Visit: Payer: Self-pay | Admitting: Rheumatology

## 2018-06-04 NOTE — Telephone Encounter (Signed)
Last Visit: 04/23/18 Next Visit: 10/27/18  Okay to refill per Dr. Corliss Skainseveshwar

## 2018-06-05 ENCOUNTER — Other Ambulatory Visit: Payer: Self-pay | Admitting: *Deleted

## 2018-06-05 NOTE — Telephone Encounter (Signed)
Error

## 2018-06-28 ENCOUNTER — Encounter (HOSPITAL_COMMUNITY): Payer: Self-pay | Admitting: *Deleted

## 2018-06-28 ENCOUNTER — Ambulatory Visit (HOSPITAL_COMMUNITY)
Admission: EM | Admit: 2018-06-28 | Discharge: 2018-06-28 | Disposition: A | Discharge status: Home / Self Care | Payer: 59 | Attending: Internal Medicine | Admitting: Internal Medicine

## 2018-06-28 DIAGNOSIS — S60512A Abrasion of left hand, initial encounter: Secondary | ICD-10-CM | POA: Diagnosis not present

## 2018-06-28 DIAGNOSIS — W5503XA Scratched by cat, initial encounter: Secondary | ICD-10-CM

## 2018-06-28 HISTORY — DX: Diverticulitis of intestine, part unspecified, without perforation or abscess without bleeding: K57.92

## 2018-06-28 MED ORDER — AZITHROMYCIN 250 MG PO TABS: ORAL_TABLET | ORAL | 0 refills | Status: AC

## 2018-06-28 MED ORDER — TETANUS-DIPHTH-ACELL PERTUSSIS 5-2.5-18.5 LF-MCG/0.5 IM SUSP
0.5000 mL | Freq: Once | INTRAMUSCULAR | Status: AC
  Administered 2018-06-28: 0.5 mL via INTRAMUSCULAR

## 2018-06-28 MED ORDER — CEFTRIAXONE SODIUM 1 G IJ SOLR
1.0000 g | Freq: Once | INTRAMUSCULAR | Status: AC
  Administered 2018-06-28: 1 g via INTRAMUSCULAR

## 2018-06-28 MED ORDER — LIDOCAINE HCL (PF) 1 % IJ SOLN
INTRAMUSCULAR | Status: AC
  Filled 2018-06-28: qty 2

## 2018-06-28 MED ORDER — CEFTRIAXONE SODIUM 1 G IJ SOLR
INTRAMUSCULAR | Status: AC
  Filled 2018-06-28: qty 10

## 2018-06-28 MED ORDER — TETANUS-DIPHTH-ACELL PERTUSSIS 5-2.5-18.5 LF-MCG/0.5 IM SUSP
INTRAMUSCULAR | Status: AC
  Filled 2018-06-28: qty 0.5

## 2018-06-28 NOTE — ED Triage Notes (Signed)
Pt reports breaking up a fight between her family's cat & dog 2 days ago.  Denies animal bite, but believes cat's claw punctured left hand.  Hand now very swollen.  Denies fevers.

## 2018-06-28 NOTE — Discharge Instructions (Signed)
Please take first dose of azithromycin today as well, followed by 1 tab a day.  Please call your hand surgeon tomorrow morning to see if can be rechecked, if unable to be seen tomorrow with hand please return tomorrow afternoon here for a recheck of wound.  Ibuprofen, tylenol, warm compress for pain.

## 2018-06-28 NOTE — ED Provider Notes (Signed)
[facility]    CSN: [id number] Arrival date & time: 06/28/18  1223     History   Chief Complaint Chief Complaint  Patient presents with  . Hand Injury    HPI Christine [name] is a 52 y.o. female.   Christine [name] presents with complaints of left hand pain and redness which started two days ago after her cat scratched the top of her hand. She was breaking up a fight between her two cats. She had instant pain at the site, this has improved. However she has had increasing redness to the area, which causes pain when she moves her fingers. She has had surgery to her left ring finger by dr. Amanda Pea years ago. No fevers. No lymphadenopathy. No streaking. Pain 8/10. Has taken tylenol and advil which has not helped. Has applied ice. Hx of allergies, diverticulitis, fibromyalgia, migraines.    ROS per HPI.      Past Medical History:  Diagnosis Date  . Allergy   . Diverticulitis   . Fibromyalgia     Patient Active Problem List   Diagnosis Date Noted  . IBS (irritable bowel syndrome) 10/18/2016  . Fibromyalgia 10/18/2016  . Fatigue 10/18/2016  . Primary insomnia 10/18/2016  . 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 Trochanteric bursitis of both hips 10/18/2016  . Primary osteoarthritis of both knees 10/18/2016  . 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 Bilateral calcaneal spurs 10/18/2016  . Vitamin D deficiency 10/18/2016  . UTI (urinary tract infection) 11/08/2014  . Allergic rhinitis 02/21/2013  . Migraine 02/21/2013    Past Surgical History:  Procedure Laterality Date  . ABDOMINAL HYSTERECTOMY    . CESAREAN SECTION    . FINGER SURGERY     left ring finger x 2  . FRACTURE SURGERY    . TUBAL LIGATION      OB History   None      Home Medications    Prior to Admission medications   Medication Sig Start Date End Date Taking? Authorizing Provider  cholecalciferol (VITAMIN D) 1000 UNITS tablet Take 1,000 Units by mouth daily.    Yes [provider]  fish oil-omega-3 fatty acids 1000 MG capsule Take 2 g by mouth daily.   Yes [provider]  methocarbamol (ROBAXIN) 500 MG tablet Take 1 tablet (500 mg total) by mouth 2 (two) times daily with breakfast and lunch. 04/23/18  Yes Gearldine Bienenstock, PA-C  tiZANidine (ZANAFLEX) 4 MG capsule TAKE 1 CAPSULE(4 MG) BY MOUTH AT BEDTIME 06/04/18  Yes Deveshwar, Janalyn Rouse, MD  TURMERIC PO Take 900 mg by mouth daily.   Yes [provider]  azithromycin (ZITHROMAX) 250 MG tablet Take 2 tablets (500 mg total) by mouth daily for 1 day, THEN 1 tablet (250 mg total) daily for 4 days. 06/28/18 07/03/18  Georgetta Haber, NP  dicyclomine (BENTYL) 10 MG/5ML syrup Take 20 mg by mouth as needed.     [provider]  ipratropium (ATROVENT) 0.06 % nasal spray Place 2 sprays into both nostrils 4 (four) times daily. 05/07/18   Georgetta Haber, NP  zolpidem (AMBIEN) 10 MG tablet Take 1 tablet (10 mg total) by mouth at bedtime as needed for sleep. 04/23/18   Gearldine Bienenstock, PA-C    Family History Family History  Problem Relation Age of Onset  . Heart disease Brother   . Scoliosis Son     Social History Social History   Tobacco Use  . Smoking status: Former Smoker  Packs/day: 0.25    Years: 2.00    Pack years: 0.50    Types: Cigarettes    Last attempt to quit: 02/22/1988    Years since quitting: 30.3  . Smokeless tobacco: Never Used  Substance Use Topics  . Alcohol use: No  . Drug use: No     Allergies   Other; Penicillins; and Sulfa antibiotics   Review of Systems Review of Systems   Physical Exam Triage Vital Signs ED Triage Vitals  Enc Vitals Group     BP 06/28/18 1306 (!) 184/72     Pulse Rate 06/28/18 1305 64     Resp 06/28/18 1305 16     Temp 06/28/18 1305 98.2 F (36.8 C)     Temp Source 06/28/18 1305 Oral     SpO2 06/28/18 1305 100 %     Weight --      Height --      Head Circumference --      Peak Flow --      Pain Score 06/28/18  1306 9     Pain Loc --      Pain Edu? --      Excl. in GC? --    No data found.  Updated Vital Signs BP (!) 184/72   Pulse 64   Temp 98.2 F (36.8 C) (Oral)   Resp 16   LMP 04/02/2006   SpO2 100%   Visual Acuity Right Eye Distance:   Left Eye Distance:   Bilateral Distance:    Right Eye Near:   Left Eye Near:    Bilateral Near:     Physical Exam  Constitutional: She is oriented to person, place, and time. She appears well-developed and well-nourished. No distress.  Cardiovascular: Normal rate, regular rhythm and normal heart sounds.  Pulmonary/Chest: Effort normal and breath sounds normal.  Musculoskeletal:       Left wrist: Normal.       Left hand: She exhibits decreased range of motion, tenderness, bony tenderness and swelling. She exhibits normal capillary refill, no deformity and no laceration. Normal sensation noted. Decreased strength noted.  Small puncture site noted to dorsal left hand with surrounding redness, blanches; tenderness to dorsal hand, primarily to left ring finger surrounding MCP joint; sensation intact; cap refill WNL; pain to dorsal hand with flexion at MCP to fingers 2-4; see photo   Neurological: She is alert and oriented to person, place, and time.  Skin: Skin is warm and dry.       UC Treatments / Results  Labs (all labs ordered are listed, but only abnormal results are displayed) Labs Reviewed - No data to display  EKG None  Radiology No results found.  Procedures Procedures (including critical care time)  Medications Ordered in UC Medications  cefTRIAXone (ROCEPHIN) injection 1 g (has no administration in time range)  Tdap (BOOSTRIX) injection 0.5 mL (has no administration in time range)    Initial Impression / Assessment and Plan / UC Course  I have reviewed the triage vital signs and the nursing notes.  Pertinent labs & imaging results that were available during my care of the patient were reviewed by me and considered in  my medical decision making (see chart for details).     Rocephin 1g x1 today. tdap updated. Azithromycin provided. Hand surgery in the past with concern for current infection related to cat claw puncture. Follow up for recheck tomorrow unless able to be seen by hand surgery tomorrow. Patient verbalized understanding and  agreeable to plan.   Final Clinical Impressions(s) / UC Diagnoses   Final diagnoses:  Cat scratch of left hand, initial encounter     Discharge Instructions     Please take first dose of azithromycin today as well, followed by 1 tab a day.  Please call your hand surgeon tomorrow morning to see if can be rechecked, if unable to be seen tomorrow with hand please return tomorrow afternoon here for a recheck of wound.  Ibuprofen, tylenol, warm compress for pain.    ED Prescriptions    Medication Sig Dispense Auth. Provider   azithromycin (ZITHROMAX) 250 MG tablet Take 2 tablets (500 mg total) by mouth daily for 1 day, THEN 1 tablet (250 mg total) daily for 4 days. 6 tablet Georgetta Haber, NP     Controlled Substance Prescriptions [place] Controlled Substance Registry consulted? Not Applicable   Georgetta Haber, NP 06/28/18 1416

## 2018-06-29 ENCOUNTER — Encounter (HOSPITAL_COMMUNITY): Payer: Self-pay | Admitting: Emergency Medicine

## 2018-06-29 ENCOUNTER — Other Ambulatory Visit: Payer: Self-pay

## 2018-06-29 ENCOUNTER — Ambulatory Visit (HOSPITAL_COMMUNITY)
Admission: EM | Admit: 2018-06-29 | Discharge: 2018-06-29 | Disposition: A | Discharge status: Home / Self Care | Payer: 59 | Attending: Family Medicine | Admitting: Family Medicine

## 2018-06-29 DIAGNOSIS — Z5189 Encounter for other specified aftercare: Secondary | ICD-10-CM

## 2018-06-29 DIAGNOSIS — W5503XD Scratched by cat, subsequent encounter: Secondary | ICD-10-CM

## 2018-06-29 DIAGNOSIS — S60512D Abrasion of left hand, subsequent encounter: Secondary | ICD-10-CM | POA: Diagnosis not present

## 2018-06-29 MED ORDER — MELOXICAM 7.5 MG PO TABS: 7.5000 mg | ORAL_TABLET | Freq: Every day | ORAL | 0 refills | Status: DC

## 2018-06-29 NOTE — ED Triage Notes (Signed)
Pt here for a recheck on her left hand from a diagnosis of cellulitis.  She was unable to get an appointment with here PCP.

## 2018-06-29 NOTE — Discharge Instructions (Addendum)
Your redness looked better compared to yesterday. Please continue azithromycin and follow up with hand surgeon for further evaluation and to make sure movement of fingers return to baseline. Currently, numbness/tingling and increased bruising could be due to swelling and ice compress, as swelling decreases, and infection decreases this should be improving. Start Mobic. Do not take ibuprofen (motrin/advil)/ naproxen (aleve) while on mobic. This will help with pain as well as inflammation. If experiencing worsening symptoms, spreading redness, increased warmth, fever, swelling of the joints, go to the emergency department for further evaluation needed.

## 2018-06-29 NOTE — ED Provider Notes (Signed)
[facility]    CSN: [id number] Arrival date & time: 06/29/18  1533     History   Chief Complaint Chief Complaint  Patient presents with  . Follow-up    left hand cellulitis    HPI Christine [name] is a 52 y.o. female.   52 year old female comes in for recheck of wound to the left hand.  She was seen yesterday for cat scratch to the dorsal aspect of the left hand that she sustained 3 days ago.  During visit yesterday, she was put on azithromycin and was given a Rocephin injection.  She states erythema has improved since starting antibiotics.  She continues to have decreased range of motion of the fingers, and now with some numbness and tingling. She has some bruising to where erythema was yesterday.  Denies fever, chills, night sweats.  Has continued to apply ice.     Past Medical History:  Diagnosis Date  . Allergy   . Diverticulitis   . Fibromyalgia     Patient Active Problem List   Diagnosis Date Noted  . IBS (irritable bowel syndrome) 10/18/2016  . Fibromyalgia 10/18/2016  . Fatigue 10/18/2016  . Primary insomnia 10/18/2016  . 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 Trochanteric bursitis of both hips 10/18/2016  . Primary osteoarthritis of both knees 10/18/2016  . 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 Bilateral calcaneal spurs 10/18/2016  . Vitamin D deficiency 10/18/2016  . UTI (urinary tract infection) 11/08/2014  . Allergic rhinitis 02/21/2013  . Migraine 02/21/2013    Past Surgical History:  Procedure Laterality Date  . ABDOMINAL HYSTERECTOMY    . CESAREAN SECTION    . FINGER SURGERY     left ring finger x 2  . FRACTURE SURGERY    . TUBAL LIGATION      OB History   None      Home Medications    Prior to Admission medications   Medication Sig Start Date End Date Taking? Authorizing Provider  azithromycin (ZITHROMAX) 250 MG tablet Take 2 tablets (500 mg total) by mouth daily for 1 day, THEN 1 tablet  (250 mg total) daily for 4 days. 06/28/18 07/03/18 Yes Burky, Dorene Grebe B, NP  cholecalciferol (VITAMIN D) 1000 UNITS tablet Take 1,000 Units by mouth daily.   Yes [provider]  dicyclomine (BENTYL) 10 MG/5ML syrup Take 20 mg by mouth as needed.    Yes [provider]  fish oil-omega-3 fatty acids 1000 MG capsule Take 2 g by mouth daily.   Yes [provider]  methocarbamol (ROBAXIN) 500 MG tablet Take 1 tablet (500 mg total) by mouth 2 (two) times daily with breakfast and lunch. 04/23/18  Yes Gearldine Bienenstock, PA-C  tiZANidine (ZANAFLEX) 4 MG capsule TAKE 1 CAPSULE(4 MG) BY MOUTH AT BEDTIME 06/04/18  Yes Deveshwar, Janalyn Rouse, MD  TURMERIC PO Take 900 mg by mouth daily.   Yes [provider]  zolpidem (AMBIEN) 10 MG tablet Take 1 tablet (10 mg total) by mouth at bedtime as needed for sleep. 04/23/18  Yes Gearldine Bienenstock, PA-C  ipratropium (ATROVENT) 0.06 % nasal spray Place 2 sprays into both nostrils 4 (four) times daily. 05/07/18   Georgetta Haber, NP  meloxicam (MOBIC) 7.5 MG tablet Take 1 tablet (7.5 mg total) by mouth daily. 06/29/18   Belinda Fisher, PA-C    Family History Family History  Problem Relation Age of Onset  . Heart disease Brother   . Scoliosis Son  Social History Social History   Tobacco Use  . Smoking status: Former Smoker    Packs/day: 0.25    Years: 2.00    Pack years: 0.50    Types: Cigarettes    Last attempt to quit: 02/22/1988    Years since quitting: 30.3  . Smokeless tobacco: Never Used  Substance Use Topics  . Alcohol use: No  . Drug use: No     Allergies   Other; Penicillins; and Sulfa antibiotics   Review of Systems Review of Systems  Reason unable to perform ROS: See HPI as above.     Physical Exam Triage Vital Signs ED Triage Vitals [06/29/18 1607]  Enc Vitals Group     BP (!) 146/82     Pulse Rate 60     Resp      Temp 98.2 F (36.8 C)     Temp Source Oral     SpO2 100 %     Weight      Height      Head  Circumference      Peak Flow      Pain Score 7     Pain Loc      Pain Edu?      Excl. in GC?    No data found.  Updated Vital Signs BP (!) 146/82 (BP Location: Right Arm)   Pulse 60   Temp 98.2 F (36.8 C) (Oral)   LMP 04/02/2006   SpO2 100%   Physical Exam  Constitutional: She is oriented to person, place, and time. She appears well-developed and well-nourished. No distress.  HENT:  Head: Normocephalic and atraumatic.  Eyes: Pupils are equal, round, and reactive to light. Conjunctivae are normal.  Musculoskeletal:  See picture below.  Mild swelling, specifically to the left 4th-5th MTP.  Contusion to the area with small contusions to the finger as well.  No obvious erythema, increased warmth.  Tenderness to palpation to the dorsal aspect of the fourth and fifth MTP.  Mild tenderness to palpation throughout rest of hand and fingers.  Decreased range of motion.  Strength deferred.  Sensation intact, though slightly decreased compared to the right diffusely.  Radial pulse 2+ and equal bilaterally.  Cap refill tested on finger pad, less than 2 seconds.  Neurological: She is alert and oriented to person, place, and time.  Skin: She is not diaphoretic.        UC Treatments / Results  Labs (all labs ordered are listed, but only abnormal results are displayed) Labs Reviewed - No data to display  EKG None  Radiology No results found.  Procedures Procedures (including critical care time)  Medications Ordered in UC Medications - No data to display  Initial Impression / Assessment and Plan / UC Course  I have reviewed the triage vital signs and the nursing notes.  Pertinent labs & imaging results that were available during my care of the patient were reviewed by me and considered in my medical decision making (see chart for details).    Linus MakoNatalie Burky, NP, who saw patient yesterday also examined patient.  Much improved erythema after Rocephin and azithromycin.  Will have  patient continue antibiotics.  Mobic for pain and inflammation.  Continue ice compress.  Will have patient continue to monitor, and try to obtain appointment with Dr. Amanda PeaGramig.  Return precautions given.  Patient expresses understanding and agrees to plan.  Final Clinical Impressions(s) / UC Diagnoses   Final diagnoses:  Visit for wound check  ED Prescriptions    Medication Sig Dispense Auth. Provider   meloxicam (MOBIC) 7.5 MG tablet Take 1 tablet (7.5 mg total) by mouth daily. 15 tablet Threasa Alpha, [place] 06/29/18 1758

## 2018-09-14 ENCOUNTER — Other Ambulatory Visit: Payer: Self-pay | Admitting: Physician Assistant

## 2018-09-14 NOTE — Telephone Encounter (Signed)
Last Visit: 04/23/18 Next Visit: 10/27/18  Okay to refill per Dr. Deveshwar 

## 2018-10-13 NOTE — Progress Notes (Signed)
Office Visit Note  Patient: Christine [name]             Date of Birth: 1966/05/24           MRN: [id number]             PCP: Martha Clan, MD Referring: Martha Clan, MD Visit Date: 10/27/2018 Occupation: @GUAROCC @  Subjective:    History of Present Illness: Christine [name] is a 52 y.o. female with history of fibromyalgia, DDD, and osteoarthritis.  She reports that in July she fell and hurt her right hand.  She had a fracture of right middle distal phalanx.  She still have some tendons which are not healing properly.  She states she has been followed by Dr. Amanda Pea who plans to do MRI if her healing is not complete.  She continues to have some discomfort in the bilateral knee joints.  Left knee joint is more painful.  She is also having some discomfort in her left foot.  She has TMJ discomfort.  She was diagnosed with DMD by her dentist.  She is taking Ambien 10 mg by mouth at bedtime for insomnia and Zanaflex 4 mg by mouth at bedtime.     Activities of Daily Living:  Patient reports morning stiffness for 30-60 minutes.   Patient Reports nocturnal pain.  Difficulty dressing/grooming: Denies Difficulty climbing stairs: Denies Difficulty getting out of chair: Denies Difficulty using hands for taps, buttons, cutlery, and/or writing: Denies  Review of Systems  Constitutional: Positive for fatigue.  HENT: Negative for mouth sores, mouth dryness and nose dryness.   Eyes: Positive for dryness. Negative for pain and visual disturbance.  Respiratory: Negative for cough, hemoptysis, shortness of breath and difficulty breathing.   Cardiovascular: Negative for chest pain, palpitations, hypertension and swelling in legs/feet.  Gastrointestinal: Negative for blood in stool, constipation and diarrhea.  Endocrine: Negative for increased urination.  Genitourinary: Negative for difficulty urinating and painful urination.  Musculoskeletal: Positive for myalgias, morning stiffness and  myalgias. Negative for arthralgias, joint pain, joint swelling, muscle weakness and muscle tenderness.  Skin: Negative for color change, pallor, rash, hair loss, nodules/bumps, skin tightness, ulcers and sensitivity to sunlight.  Allergic/Immunologic: Negative for susceptible to infections.  Neurological: Positive for headaches. Negative for dizziness, numbness and weakness.  Hematological: Negative for swollen glands.  Psychiatric/Behavioral: Positive for sleep disturbance. Negative for depressed mood. The patient is not nervous/anxious.     PMFS History:  Patient Active Problem List   Diagnosis Date Noted  . IBS (irritable bowel syndrome) 10/18/2016  . Fibromyalgia 10/18/2016  . Fatigue 10/18/2016  . Primary insomnia 10/18/2016  . 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 Trochanteric bursitis of both hips 10/18/2016  . Primary osteoarthritis of both knees 10/18/2016  . 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 Bilateral calcaneal spurs 10/18/2016  . Vitamin D deficiency 10/18/2016  . UTI (urinary tract infection) 11/08/2014  . Allergic rhinitis 02/21/2013  . Migraine 02/21/2013    Past Medical History:  Diagnosis Date  . Allergy   . Diverticulitis   . Fibromyalgia     Family History  Problem Relation Age of Onset  . Heart disease Brother   . Scoliosis Son    Past Surgical History:  Procedure Laterality Date  . ABDOMINAL HYSTERECTOMY    . CESAREAN SECTION    . FINGER SURGERY     left ring finger x 2  . FRACTURE SURGERY    . TUBAL LIGATION     Social History  Social History Narrative  . Not on file    Objective: Vital Signs: BP (!) 148/76 (BP Location: Left Arm, Patient Position: Sitting, Cuff Size: Normal)   Pulse 74   Ht 5\' 3"  (1.6 m)   Wt 225 lb (102.1 kg)   LMP 04/02/2006   BMI 39.86 kg/m    Physical Exam  Constitutional: She is oriented to person, place, and time. She appears well-developed and well-nourished.  HENT:  Head:  Normocephalic and atraumatic.  Eyes: Conjunctivae and EOM are normal.  Neck: Normal range of motion.  Cardiovascular: Normal rate, regular rhythm, normal heart sounds and intact distal pulses.  Pulmonary/Chest: Effort normal and breath sounds normal.  Abdominal: Soft. Bowel sounds are normal.  Lymphadenopathy:    She has no cervical adenopathy.  Neurological: She is alert and oriented to person, place, and time.  Skin: Skin is warm and dry. Capillary refill takes less than 2 seconds.  Psychiatric: She has a normal mood and affect. Her behavior is normal.  Nursing note and vitals reviewed.    Musculoskeletal Exam: C-spine good range of motion.  She has discomfort range of motion of her lumbar spine.  Shoulder joints elbow joints wrist joint MCPs PIPs DIPs been good range of motion.  She has some swelling over her third MCP due to recent injury.  Knee joints ankles MTPs PIPs been good range of motion.  She does have some hypermobility in her joints.  No warmth swelling or effusion was noted.  CDAI Exam: CDAI Score: Not documented Patient Global Assessment: Not documented; Provider Global Assessment: Not documented Swollen: Not documented; Tender: Not documented Joint Exam   Not documented   There is currently no information documented on the homunculus. Go to the Rheumatology activity and complete the homunculus joint exam.  Investigation: No additional findings.  Imaging: No results found.  Recent Labs: Lab Results  Component Value Date   WBC 4.4 (A) 02/21/2013   HGB 14.7 02/21/2013   PLT 303 06/30/2008    Speciality Comments: No specialty comments available.  Procedures:  No procedures performed Allergies: Other; Penicillins; and Sulfa antibiotics   Assessment / Plan:     Visit Diagnoses: Primary osteoarthritis of both knees-patient continues to have pain and discomfort in her bilateral knee joints.  No warmth swelling or effusion was noted.  Weight loss diet and  exercise was discussed.  Trochanteric bursitis of both hips-she has intermittent discomfort in her trochanteric area.  For which exercises were discussed.  DDD (degenerative disc disease), lumbar-she has some chronic lower back pain.  Bilateral calcaneal spurs-she has been experiencing increased feet discomfort.  Fibromyalgia-she has generalized discomfort positive tender points.  Other fatigue-secondary to insomnia.  Primary insomnia - Ambien 10 mg at bedtime.  She also takes Zanaflex 4 mg at bedtime.  She states she takes Ambien only occasionally and Zanaflex on regular basis.  Vitamin D deficiency-she is on vitamin D supplement.  History of IBS  History of migraine   Orders: No orders of the defined types were placed in this encounter.  No orders of the defined types were placed in this encounter.   Face-to-face time spent with patient was 30 minutes. Greater than 50% of time was spent in counseling and coordination of care.  Follow-Up Instructions: Return in about 6 months (around 04/27/2019) for Osteoarthritis, DDD, Fibromyalgia.   Pollyann Savoy, MD  Note - This record has been created using Animal nutritionist.  Chart creation errors have been sought, but may not always  have  been located. Such creation errors do not reflect on  the standard of medical care.

## 2018-10-16 ENCOUNTER — Other Ambulatory Visit: Payer: Self-pay | Admitting: Rheumatology

## 2018-10-16 NOTE — Telephone Encounter (Signed)
Last Visit: 04/23/18 Next Visit: 10/27/18  Okay to refill per Dr. Deveshwar 

## 2018-10-27 ENCOUNTER — Encounter (INDEPENDENT_AMBULATORY_CARE_PROVIDER_SITE_OTHER): Payer: Self-pay

## 2018-10-27 ENCOUNTER — Ambulatory Visit: Payer: 59 | Admitting: Rheumatology

## 2018-10-27 ENCOUNTER — Encounter: Payer: Self-pay | Admitting: Physician Assistant

## 2018-10-27 VITALS — BP 148/76 | HR 74 | Ht 63.0 in | Wt 225.0 lb

## 2018-10-27 DIAGNOSIS — M7061 Trochanteric bursitis, right hip: Secondary | ICD-10-CM

## 2018-10-27 DIAGNOSIS — M5136 Other intervertebral disc degeneration, lumbar region: Secondary | ICD-10-CM

## 2018-10-27 DIAGNOSIS — M797 Fibromyalgia: Secondary | ICD-10-CM

## 2018-10-27 DIAGNOSIS — M7731 Calcaneal spur, right foot: Secondary | ICD-10-CM

## 2018-10-27 DIAGNOSIS — E559 Vitamin D deficiency, unspecified: Secondary | ICD-10-CM

## 2018-10-27 DIAGNOSIS — M170 Bilateral primary osteoarthritis of knee: Secondary | ICD-10-CM

## 2018-10-27 DIAGNOSIS — F5101 Primary insomnia: Secondary | ICD-10-CM

## 2018-10-27 DIAGNOSIS — R5383 Other fatigue: Secondary | ICD-10-CM

## 2018-10-27 DIAGNOSIS — M7732 Calcaneal spur, left foot: Secondary | ICD-10-CM

## 2018-10-27 DIAGNOSIS — Z8669 Personal history of other diseases of the nervous system and sense organs: Secondary | ICD-10-CM

## 2018-10-27 DIAGNOSIS — M7062 Trochanteric bursitis, left hip: Secondary | ICD-10-CM

## 2018-10-27 DIAGNOSIS — Z8719 Personal history of other diseases of the digestive system: Secondary | ICD-10-CM

## 2018-12-14 ENCOUNTER — Other Ambulatory Visit: Payer: Self-pay | Admitting: *Deleted

## 2018-12-14 MED ORDER — ZOLPIDEM TARTRATE 10 MG PO TABS: 10.0000 mg | ORAL_TABLET | Freq: Every evening | ORAL | 2 refills | Status: DC | PRN

## 2018-12-14 NOTE — Telephone Encounter (Signed)
Refill request received via fax  Last visit: 10/27/18 Next Visit: 04/28/19  Okay to refill Ambien?

## 2018-12-17 ENCOUNTER — Telehealth: Payer: Self-pay | Admitting: Rheumatology

## 2018-12-17 MED ORDER — TIZANIDINE HCL 4 MG PO CAPS: ORAL_CAPSULE | ORAL | 0 refills | Status: DC

## 2018-12-17 NOTE — Telephone Encounter (Signed)
Last visit: 10/27/18 Next Visit: 04/28/19  Okay to refill per Dr. Deveshwar 

## 2018-12-17 NOTE — Telephone Encounter (Signed)
Patient left a voicemail requesting prescription refill of Tizanidine to be sent to [facility] on [facility].

## 2019-01-14 ENCOUNTER — Other Ambulatory Visit: Payer: Self-pay | Admitting: Rheumatology

## 2019-01-14 NOTE — Telephone Encounter (Signed)
Last visit: 10/27/18 Next Visit: 04/28/19  Okay to refill per Dr. Corliss Skains

## 2019-02-15 ENCOUNTER — Other Ambulatory Visit: Payer: Self-pay | Admitting: Rheumatology

## 2019-02-15 NOTE — Telephone Encounter (Signed)
Last visit: 10/27/18 Next Visit: 04/28/19  Okay to refill per Dr. Corliss Skains

## 2019-03-22 ENCOUNTER — Other Ambulatory Visit: Payer: Self-pay | Admitting: Rheumatology

## 2019-03-22 NOTE — Telephone Encounter (Signed)
Last visit: 10/27/18 Next Visit: 04/28/19  Okay to refill per Dr. Deveshwar 

## 2019-04-26 NOTE — Progress Notes (Signed)
Office Visit Note  Patient: Christine [name]             Date of Birth: 10-Sep-1966           MRN: [id number]             PCP: Martha Clan, MD Referring: Martha Clan, MD Visit Date: 04/28/2019 Occupation: @  Subjective:  Trapezius muscle spasms   History of Present Illness: Christine [name] is a 53 y.o. female with history of osteoarthritis, fibromyalgia, and DDD.  She takes Robaxin 500 mg BID prn for muscle spasms and Zanaflex 4 mg po at bedtime for spasms.  She takes Ambien 10 mg po at bedtime as needed for insomnia.  She usually only takes Ambien on the weekends.  She reports that she had a fibromyalgia flare last week.  She is having severe trapezius muscle spasms and muscle tension.  She was also experiencing left trochanteric bursitis and lower back pain.  She states that her symptoms have resolved at this time.  She continues to have joint stiffness in both hands but denies any joint swelling.  She states that her knee joints have been doing well without any discomfort or joint swelling.   Activities of Daily Living:  Patient reports morning stiffness for 30-60 minutes.   Patient Reports nocturnal pain.  Difficulty dressing/grooming: Denies Difficulty climbing stairs: Denies Difficulty getting out of chair: Denies Difficulty using hands for taps, buttons, cutlery, and/or writing: Denies  Review of Systems  Constitutional: Positive for fatigue.  HENT: Negative for mouth sores, mouth dryness and nose dryness.   Eyes: Positive for dryness. Negative for pain and visual disturbance.  Respiratory: Negative for cough, hemoptysis, shortness of breath and difficulty breathing.   Cardiovascular: Negative for chest pain, palpitations, hypertension and swelling in legs/feet.  Gastrointestinal: Negative for blood in stool, constipation and diarrhea.  Endocrine: Negative for increased urination.  Genitourinary: Negative for painful urination.  Musculoskeletal: Positive for  myalgias, morning stiffness, muscle tenderness and myalgias. Negative for arthralgias, joint pain, joint swelling and muscle weakness.  Skin: Negative for color change, pallor, rash, hair loss, nodules/bumps, skin tightness, ulcers and sensitivity to sunlight.  Allergic/Immunologic: Negative for susceptible to infections.  Neurological: Positive for headaches. Negative for dizziness, numbness and weakness.  Hematological: Negative for swollen glands.  Psychiatric/Behavioral: Positive for depressed mood and sleep disturbance. The patient is nervous/anxious.     PMFS History:  Patient Active Problem List   Diagnosis Date Noted  . IBS (irritable bowel syndrome) 10/18/2016  . Fibromyalgia 10/18/2016  . Fatigue 10/18/2016  . Primary insomnia 10/18/2016  . 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 Trochanteric bursitis of both hips 10/18/2016  . Primary osteoarthritis of both knees 10/18/2016  . 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 Bilateral calcaneal spurs 10/18/2016  . Vitamin D deficiency 10/18/2016  . UTI (urinary tract infection) 11/08/2014  . Allergic rhinitis 02/21/2013  . Migraine 02/21/2013    Past Medical History:  Diagnosis Date  . Allergy   . Diverticulitis   . Fibromyalgia     Family History  Problem Relation Age of Onset  . Heart disease Brother   . Scoliosis Son    Past Surgical History:  Procedure Laterality Date  . ABDOMINAL HYSTERECTOMY    . CESAREAN SECTION    . FINGER SURGERY     left ring finger x 2  . FRACTURE SURGERY    . TUBAL LIGATION     Social History   Social History Narrative  .  Not on file   Immunization History  Administered Date(s) Administered  . Tdap 06/28/2018     Objective: Vital Signs: BP (!) 160/105 (BP Location: Right Arm, Patient Position: Sitting, Cuff Size: Large)   Pulse 99   Resp 15   Ht 5' 3.5" (1.613 m)   Wt 240 lb (108.9 kg)   LMP 04/02/2006   BMI 41.85 kg/m    Physical Exam Vitals  signs and nursing note reviewed.  Constitutional:      Appearance: She is well-developed.  HENT:     Head: Normocephalic and atraumatic.  Eyes:     Conjunctiva/sclera: Conjunctivae normal.  Neck:     Musculoskeletal: Normal range of motion.  Cardiovascular:     Rate and Rhythm: Normal rate and regular rhythm.     Heart sounds: Normal heart sounds.  Pulmonary:     Effort: Pulmonary effort is normal.     Breath sounds: Normal breath sounds.  Abdominal:     General: Bowel sounds are normal.     Palpations: Abdomen is soft.  Lymphadenopathy:     Cervical: No cervical adenopathy.  Skin:    General: Skin is warm and dry.     Capillary Refill: Capillary refill takes less than 2 seconds.  Neurological:     Mental Status: She is alert and oriented to person, place, and time.  Psychiatric:        Behavior: Behavior normal.      Musculoskeletal Exam: Hypermobility of multiple joints.  C-spine, thoracic spine, lumbar spine good range of motion.  No midline spinal tenderness.  No SI joint tenderness.  Shoulder joints, elbows, wrist joints, MCPs and PIPs, DIPs good range of motion no synovitis.  She has complete fist formation bilaterally.  Hip joints, knee joints, ankle joints, MTPs, PIPs, DIPs good range of motion with no synovitis.  No warmth or effusion of bilateral knee joints.  No tenderness or swelling of ankle joints.  Tenderness over the left trochanteric bursa.  CDAI Exam: CDAI Score: Not documented Patient Global Assessment: Not documented; Provider Global Assessment: Not documented Swollen: Not documented; Tender: Not documented Joint Exam   Not documented   There is currently no information documented on the homunculus. Go to the Rheumatology activity and complete the homunculus joint exam.  Investigation: No additional findings.  Imaging: No results found.  Recent Labs: Lab Results  Component Value Date   WBC 4.4 (A) 02/21/2013   HGB 14.7 02/21/2013   PLT 303  06/30/2008    Speciality Comments: No specialty comments available.  Procedures:  No procedures performed Allergies: Other; Penicillins; and Sulfa antibiotics   Assessment / Plan:     Visit Diagnoses: Primary osteoarthritis of both knees: She has no discomfort in her knee joints at this time.  No warmth or effusion was noted.  She has good range of motion with no discomfort.  She was encouraged to stay active and exercise on a regular basis.  Trochanteric bursitis of left hip: She has tenderness over the left trochanteric bursa.  She did fibromyalgia flare last week and had an exacerbation of left trochanter bursitis at that time.  Her symptoms have subsided. She declined a cortisone injection. She was encouraged to perform stretching exercises on a regular basis.  DDD (degenerative disc disease), lumbar: She has good range of motion with no discomfort.  No midline spinal tenderness.  She experienced increased lower back pain and muscle spasms last week during a fibromyalgia flare.  Her lower back pain has  subsided at this time.  Fibromyalgia: She had a fibromyalgia flare last week that lasted 3-4 days.  She experienced trapezius muscle spasms and muscle tension, increased lower back pian, and left trochanteric bursitis.  Her discomfort has subsided.  She takes Robaxin 500 mg twice daily PRN for muscle spasms and Zanaflex 4 mg by mouth at bedtime for muscle spasms.  She takes Ambien 10 mg by mouth at bedtime very sparingly and usually only on the weekends.  She requested refills of Robaxin, Zanaflex, and Ambien.  These will be sent to the pharmacy today.  Bilateral calcaneal spurs  Other fatigue: Her level of fatigue has been stable overall.   Primary insomnia - She takes Ambien 10 mg by mouth at bedtime PRN for insomnia.  She usually only taking Ambien on the weekends. Good sleep hygiene was discussed.   Vitamin D deficiency: She takes a vitamin D supplement daily.   Other medical  conditions are listed as follows:   History of migraine  History of IBS   Orders: No orders of the defined types were placed in this encounter.  No orders of the defined types were placed in this encounter.   Face-to-face time spent with patient was 30 minutes. Greater than 50% of time was spent in counseling and coordination of care.  Follow-Up Instructions: Return for Osteoarthritis, Fibromyalgia, DDD.   Gearldine Bienenstock, PA-C   I examined and evaluated the patient with Christine Ales PA.  She continues to have a lot of pain and discomfort due to underlying osteoarthritis and fibromyalgia.  She had no synovitis on my examination today.  The plan of care was discussed as noted above.  Pollyann Savoy, MD  Note - This record has been created using Animal nutritionist.  Chart creation errors have been sought, but may not always  have been located. Such creation errors do not reflect on  the standard of medical care.

## 2019-04-28 ENCOUNTER — Ambulatory Visit: Payer: 59 | Admitting: Rheumatology

## 2019-04-28 ENCOUNTER — Encounter: Payer: Self-pay | Admitting: Rheumatology

## 2019-04-28 ENCOUNTER — Encounter (INDEPENDENT_AMBULATORY_CARE_PROVIDER_SITE_OTHER): Payer: Self-pay

## 2019-04-28 ENCOUNTER — Other Ambulatory Visit: Payer: Self-pay

## 2019-04-28 VITALS — BP 160/105 | HR 99 | Resp 15 | Ht 63.5 in | Wt 240.0 lb

## 2019-04-28 DIAGNOSIS — R5383 Other fatigue: Secondary | ICD-10-CM

## 2019-04-28 DIAGNOSIS — M5136 Other intervertebral disc degeneration, lumbar region: Secondary | ICD-10-CM

## 2019-04-28 DIAGNOSIS — M7062 Trochanteric bursitis, left hip: Secondary | ICD-10-CM

## 2019-04-28 DIAGNOSIS — M170 Bilateral primary osteoarthritis of knee: Secondary | ICD-10-CM | POA: Diagnosis not present

## 2019-04-28 DIAGNOSIS — M797 Fibromyalgia: Secondary | ICD-10-CM

## 2019-04-28 DIAGNOSIS — Z8669 Personal history of other diseases of the nervous system and sense organs: Secondary | ICD-10-CM

## 2019-04-28 DIAGNOSIS — M7731 Calcaneal spur, right foot: Secondary | ICD-10-CM

## 2019-04-28 DIAGNOSIS — Z8719 Personal history of other diseases of the digestive system: Secondary | ICD-10-CM

## 2019-04-28 DIAGNOSIS — M7732 Calcaneal spur, left foot: Secondary | ICD-10-CM

## 2019-04-28 DIAGNOSIS — E559 Vitamin D deficiency, unspecified: Secondary | ICD-10-CM

## 2019-04-28 DIAGNOSIS — F5101 Primary insomnia: Secondary | ICD-10-CM

## 2019-04-28 MED ORDER — ZOLPIDEM TARTRATE 10 MG PO TABS: 10.0000 mg | ORAL_TABLET | Freq: Every evening | ORAL | 2 refills | Status: DC | PRN

## 2019-04-28 MED ORDER — TIZANIDINE HCL 4 MG PO CAPS: ORAL_CAPSULE | ORAL | 0 refills | Status: DC

## 2019-04-28 MED ORDER — METHOCARBAMOL 500 MG PO TABS: 500.0000 mg | ORAL_TABLET | Freq: Two times a day (BID) | ORAL | 5 refills | Status: DC

## 2019-05-26 ENCOUNTER — Other Ambulatory Visit: Payer: Self-pay | Admitting: Physician Assistant

## 2019-05-26 NOTE — Telephone Encounter (Signed)
Last Visit: 04/28/19 Next Visit: 10/26/19   Okay to refill per Dr. Estanislado Pandy

## 2019-06-28 ENCOUNTER — Other Ambulatory Visit: Payer: Self-pay | Admitting: Rheumatology

## 2019-06-28 NOTE — Telephone Encounter (Signed)
Last Visit: 04/28/19 Next Visit: 10/26/19   Okay to refill per Dr. Deveshwar  

## 2019-09-06 ENCOUNTER — Other Ambulatory Visit: Payer: Self-pay | Admitting: Rheumatology

## 2019-09-06 NOTE — Telephone Encounter (Signed)
Last Visit: 04/28/19 Next Visit: 10/26/19   Okay to refill per Dr. Estanislado Pandy

## 2019-10-12 NOTE — Progress Notes (Signed)
Office Visit Note  Patient: Christine [name]             Date of Birth: 08/06/1966           MRN: [id number]             PCP: Marton Redwood, MD Referring: Marton Redwood, MD Visit Date: 10/26/2019 Occupation: @GUAROCC @  Subjective:  Left shoulder pain  History of Present Illness: Christine [name] is a 53 y.o. female with history of fibromyalgia, osteoarthritis, and DDD.  She continues have generalized muscle aches muscle tenderness due to fibromyalgia.  She has been having more frequent fibromyalgia flares due to weather changes recently.  She is having left trapezius muscle tension and muscle tenderness.  She has been having intermittent muscle spasms in the left trapezius muscle as well.  She is having some left shoulder discomfort as well.  She has been taking Robaxin 500 mg 1 tablet twice daily as needed for muscle spasms and Zanaflex 4 mg by mouth at bedtime as needed for muscle spasms.  She continues have difficulty sleeping at night due to discomfort she experiences.  She takes Ambien 10 mg a mouth at bedtime usually only on weekends.  She continues to have chronic fatigue which has been stable.  She is occasional left trochanter bursitis when performing overuse activities.  She has been performing stretching exercises on a regular basis.  She is occasional right knee joint pain but denies any joint swelling.  She denies any other joint pain or joint swelling at this time.   Activities of Daily Living:  Patient reports morning stiffness for 30-60 minutes.   Patient Reports nocturnal pain.  Difficulty dressing/grooming: Denies Difficulty climbing stairs: Denies Difficulty getting out of chair: Denies Difficulty using hands for taps, buttons, cutlery, and/or writing: Denies  Review of Systems  Constitutional: Positive for fatigue.  HENT: Negative for mouth sores, mouth dryness and nose dryness.   Eyes: Positive for dryness. Negative for pain and visual disturbance.  Respiratory:  Negative for cough, hemoptysis, shortness of breath and difficulty breathing.   Cardiovascular: Negative for chest pain, palpitations, hypertension and swelling in legs/feet.  Gastrointestinal: Negative for blood in stool, constipation and diarrhea.  Endocrine: Negative for increased urination.  Genitourinary: Negative for painful urination.  Musculoskeletal: Positive for arthralgias, joint pain, morning stiffness and muscle tenderness. Negative for joint swelling, myalgias, muscle weakness and myalgias.  Skin: Negative for color change, pallor, rash, hair loss, nodules/bumps, skin tightness, ulcers and sensitivity to sunlight.  Allergic/Immunologic: Negative for susceptible to infections.  Neurological: Negative for dizziness, numbness, headaches and weakness.  Hematological: Negative for swollen glands.  Psychiatric/Behavioral: Positive for depressed mood and sleep disturbance. The patient is nervous/anxious.     PMFS History:  Patient Active Problem List   Diagnosis Date Noted  . IBS (irritable bowel syndrome) 10/18/2016  . Fibromyalgia 10/18/2016  . Fatigue 10/18/2016  . Primary insomnia 10/18/2016  . 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 Trochanteric bursitis of both hips 10/18/2016  . Primary osteoarthritis of both knees 10/18/2016  . 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 Bilateral calcaneal spurs 10/18/2016  . Vitamin D deficiency 10/18/2016  . UTI (urinary tract infection) 11/08/2014  . Allergic rhinitis 02/21/2013  . Migraine 02/21/2013    Past Medical History:  Diagnosis Date  . Allergy   . Diverticulitis   . Fibromyalgia     Family History  Problem Relation Age of Onset  . Heart disease Brother   . Scoliosis Son  Past Surgical History:  Procedure Laterality Date  . ABDOMINAL HYSTERECTOMY    . CESAREAN SECTION    . FINGER SURGERY     left ring finger x 2  . FRACTURE SURGERY    . TUBAL LIGATION     Social History   Social  History Narrative  . Not on file   Immunization History  Administered Date(s) Administered  . Tdap 06/28/2018     Objective: Vital Signs: BP (!) 153/105 (BP Location: Right Arm, Patient Position: Sitting, Cuff Size: Small)   Pulse 72   Resp 13   Ht 5' 3.5" (1.613 m)   Wt 251 lb 12.8 oz (114.2 kg)   LMP 04/02/2006   BMI 43.90 kg/m    Physical Exam Vitals signs and nursing note reviewed.  Constitutional:      Appearance: She is well-developed.  HENT:     Head: Normocephalic and atraumatic.  Eyes:     Conjunctiva/sclera: Conjunctivae normal.  Neck:     Musculoskeletal: Normal range of motion.  Cardiovascular:     Rate and Rhythm: Normal rate and regular rhythm.     Heart sounds: Normal heart sounds.  Pulmonary:     Effort: Pulmonary effort is normal.     Breath sounds: Normal breath sounds.  Abdominal:     General: Bowel sounds are normal.     Palpations: Abdomen is soft.  Lymphadenopathy:     Cervical: No cervical adenopathy.  Skin:    General: Skin is warm and dry.     Capillary Refill: Capillary refill takes less than 2 seconds.  Neurological:     Mental Status: She is alert and oriented to person, place, and time.  Psychiatric:        Behavior: Behavior normal.      Musculoskeletal Exam: C-spine, thoracic spine, lumbar spine good range of motion.  Left trapezius muscle tension and tenderness. No midline spinal tenderness.  No SI joint tenderness. Left shoulder has some discomfort with ROM.  Right shoulder good ROM with no discomfort. Elbow joints, wrist joints, MCPs, PIPs, DIPs good range of motion no synovitis.  She has mild PIP and DIP synovial thickening consistent with osteoarthritis of both hands.  She has complete fist formation bilaterally.  Hip joints, knee joints, ankle joints, MTPs, PIPs, DIPs good range of motion no synovitis.  No warmth or effusion of bilateral knee joints.  No tenderness or swelling of ankle joints.  CDAI Exam: CDAI Score: - Patient  Global: -; Provider Global: - Swollen: -; Tender: - Joint Exam   No joint exam has been documented for this visit   There is currently no information documented on the homunculus. Go to the Rheumatology activity and complete the homunculus joint exam.  Investigation: No additional findings.  Imaging: No results found.  Recent Labs: Lab Results  Component Value Date   WBC 4.4 (A) 02/21/2013   HGB 14.7 02/21/2013   PLT 303 06/30/2008    Speciality Comments: No specialty comments available.  Procedures:  No procedures performed Allergies: Other, Penicillins, and Sulfa antibiotics        Assessment / Plan:     Visit Diagnoses: Primary osteoarthritis of both knees: She has good range of motion of bilateral knee joints on exam.  No warmth or effusion was noted.  She has occasional right knee joint discomfort.  She denies any mechanical symptoms.  She has been performing strengthening exercises on a regular basis.  Trochanteric bursitis of left hip: Resolved.  She has  no tenderness on exam today.  She has been performing stretching exercises on a regular basis.  DDD (degenerative disc disease), lumbar: She has good range of motion lumbar spine.  No midline spinal tenderness.  She has no symptoms of radiculopathy at this time.  Fibromyalgia - She continues have generalized muscle aches and muscle tenderness due to fibromyalgia.  She has been having more frequent flares due to frequent weather changes.  She presents today with left trapezius muscle tension and muscle tenderness.  She has been having muscle spasms intermittently.  She takes Robaxin 500 mg 1 tablet twice daily as needed for muscle spasms and Zanaflex 4 mg by mouth at bedtime as needed for muscle spasms.  She continues to have chronic fatigue related to insomnia.  She takes Ambien 10 mg a mouth at bedtime usually only on weekends.  A refill of Ambien was sent to the pharmacy today.  We discussed the importance of regular  exercise and good sleep hygiene.  Bilateral calcaneal spurs: She is not having any feet pain at this time.   Primary insomnia - She takes Ambien 10 mg by mouth at bedtime PRN.  She   Other fatigue: Chronic but stable.   Acute pain of left shoulder -She presents today with left shoulder joint discomfort.  She has good range of motion on exam with some discomfort.  X-rays of the left shoulder were obtained today and were unremarkable.  She is having left trapezius muscle tension and muscle tenderness.  She has been experiencing muscle spasms intermittently.  She has been taking Robaxin 500 mg twice daily as needed for muscle spasms and Zanaflex 4 mg by mouth at bedtime as needed.  Plan: XR Shoulder Left  Trapezius muscle spasm: She is having left trapezius muscle tension and muscle spasms.  She has been taking Robaxin and Zanaflex as prescribed.  We could not perform a cortisone injection due to elevated blood pressure today.  She was advised to follow-up with her PCP to get her blood pressure under control.  Vitamin D deficiency: Vitamin D is 1,000 units daily.   Other medical conditions are listed as follows:   History of IBS  History of migraine   Orders: Orders Placed This Encounter  Procedures  . XR Shoulder Left   Meds ordered this encounter  Medications  . zolpidem (AMBIEN) 10 MG tablet    Sig: Take 1 tablet (10 mg total) by mouth at bedtime as needed for sleep.    Dispense:  30 tablet    Refill:  2    Face-to-face time spent with patient was 30 minutes. Greater than 50% of time was spent in counseling and coordination of care.  Follow-Up Instructions: Return in about 6 months (around 04/24/2020) for Fibromyalgia, Osteoarthritis.   Gearldine Bienenstock, PA-C   I examined and evaluated the patient with Sherron Ales PA.  Patient had painful range of motion of her left shoulder joint today.  The x-ray was unremarkable.  She will benefit from getting a cortisone injection.   Unfortunately her blood pressure was high today.  She will see her PCP to get blood pressure under control.  Once it is under control she can contact us to get a cortisone injection.  The plan of care was discussed as noted above.  Pollyann Savoy, MD  Note - This record has been created using Animal nutritionist.  Chart creation errors have been sought, but may not always  have been located. Such creation errors do not  reflect on  the standard of medical care.

## 2019-10-18 ENCOUNTER — Other Ambulatory Visit: Payer: Self-pay | Admitting: Rheumatology

## 2019-10-19 NOTE — Telephone Encounter (Signed)
Last Visit: 04/28/19 Next Visit: 10/26/19   Okay to refill per Dr. Deveshwar  

## 2019-10-26 ENCOUNTER — Ambulatory Visit: Payer: 59 | Admitting: Rheumatology

## 2019-10-26 ENCOUNTER — Encounter: Payer: Self-pay | Admitting: Rheumatology

## 2019-10-26 ENCOUNTER — Other Ambulatory Visit: Payer: Self-pay

## 2019-10-26 ENCOUNTER — Ambulatory Visit: Payer: Self-pay

## 2019-10-26 VITALS — BP 153/105 | HR 72 | Resp 13 | Ht 63.5 in | Wt 251.8 lb

## 2019-10-26 DIAGNOSIS — M62838 Other muscle spasm: Secondary | ICD-10-CM

## 2019-10-26 DIAGNOSIS — M25512 Pain in left shoulder: Secondary | ICD-10-CM | POA: Diagnosis not present

## 2019-10-26 DIAGNOSIS — M797 Fibromyalgia: Secondary | ICD-10-CM | POA: Diagnosis not present

## 2019-10-26 DIAGNOSIS — M5136 Other intervertebral disc degeneration, lumbar region: Secondary | ICD-10-CM

## 2019-10-26 DIAGNOSIS — Z8669 Personal history of other diseases of the nervous system and sense organs: Secondary | ICD-10-CM

## 2019-10-26 DIAGNOSIS — M7062 Trochanteric bursitis, left hip: Secondary | ICD-10-CM

## 2019-10-26 DIAGNOSIS — M170 Bilateral primary osteoarthritis of knee: Secondary | ICD-10-CM | POA: Diagnosis not present

## 2019-10-26 DIAGNOSIS — Z8719 Personal history of other diseases of the digestive system: Secondary | ICD-10-CM

## 2019-10-26 DIAGNOSIS — F5101 Primary insomnia: Secondary | ICD-10-CM

## 2019-10-26 DIAGNOSIS — E559 Vitamin D deficiency, unspecified: Secondary | ICD-10-CM

## 2019-10-26 DIAGNOSIS — R5383 Other fatigue: Secondary | ICD-10-CM

## 2019-10-26 DIAGNOSIS — M7732 Calcaneal spur, left foot: Secondary | ICD-10-CM

## 2019-10-26 DIAGNOSIS — M7731 Calcaneal spur, right foot: Secondary | ICD-10-CM

## 2019-10-26 MED ORDER — ZOLPIDEM TARTRATE 10 MG PO TABS: 10.0000 mg | ORAL_TABLET | Freq: Every evening | ORAL | 2 refills | Status: DC | PRN

## 2019-10-26 NOTE — Patient Instructions (Signed)
Shoulder Exercises Ask your health care provider which exercises are safe for you. Do exercises exactly as told by your health care provider and adjust them as directed. It is normal to feel mild stretching, pulling, tightness, or discomfort as you do these exercises. Stop right away if you feel sudden pain or your pain gets worse. Do not begin these exercises until told by your health care provider. Stretching exercises External rotation and abduction This exercise is sometimes called corner stretch. This exercise rotates your arm outward (external rotation) and moves your arm out from your body (abduction). 1. Stand in a doorway with one of your feet slightly in front of the other. This is called a staggered stance. If you cannot reach your forearms to the door frame, stand facing a corner of a room. 2. Choose one of the following positions as told by your health care provider: ? Place your hands and forearms on the door frame above your head. ? Place your hands and forearms on the door frame at the height of your head. ? Place your hands on the door frame at the height of your elbows. 3. Slowly move your weight onto your front foot until you feel a stretch across your chest and in the front of your shoulders. Keep your head and chest upright and keep your abdominal muscles tight. 4. Hold for __________ seconds. 5. To release the stretch, shift your weight to your back foot. Repeat __________ times. Complete this exercise __________ times a day. Extension, standing 1. Stand and hold a broomstick, a cane, or a similar object behind your back. ? Your hands should be a little wider than shoulder width apart. ? Your palms should face away from your back. 2. Keeping your elbows straight and your shoulder muscles relaxed, move the stick away from your body until you feel a stretch in your shoulders (extension). ? Avoid shrugging your shoulders while you move the stick. Keep your shoulder blades tucked  down toward the middle of your back. 3. Hold for __________ seconds. 4. Slowly return to the starting position. Repeat __________ times. Complete this exercise __________ times a day. Range-of-motion exercises Pendulum  1. Stand near a wall or a surface that you can hold onto for balance. 2. Bend at the waist and let your left / right arm hang straight down. Use your other arm to support you. Keep your back straight and do not lock your knees. 3. Relax your left / right arm and shoulder muscles, and move your hips and your trunk so your left / right arm swings freely. Your arm should swing because of the motion of your body, not because you are using your arm or shoulder muscles. 4. Keep moving your hips and trunk so your arm swings in the following directions, as told by your health care provider: ? Side to side. ? Forward and backward. ? In clockwise and counterclockwise circles. 5. Continue each motion for __________ seconds, or for as long as told by your health care provider. 6. Slowly return to the starting position. Repeat __________ times. Complete this exercise __________ times a day. Shoulder flexion, standing  1. Stand and hold a broomstick, a cane, or a similar object. Place your hands a little more than shoulder width apart on the object. Your left / right hand should be palm up, and your other hand should be palm down. 2. Keep your elbow straight and your shoulder muscles relaxed. Push the stick up with your healthy arm to   raise your left / right arm in front of your body, and then over your head until you feel a stretch in your shoulder (flexion). ? Avoid shrugging your shoulder while you raise your arm. Keep your shoulder blade tucked down toward the middle of your back. 3. Hold for __________ seconds. 4. Slowly return to the starting position. Repeat __________ times. Complete this exercise __________ times a day. Shoulder abduction, standing 1. Stand and hold a broomstick,  a cane, or a similar object. Place your hands a little more than shoulder width apart on the object. Your left / right hand should be palm up, and your other hand should be palm down. 2. Keep your elbow straight and your shoulder muscles relaxed. Push the object across your body toward your left / right side. Raise your left / right arm to the side of your body (abduction) until you feel a stretch in your shoulder. ? Do not raise your arm above shoulder height unless your health care provider tells you to do that. ? If directed, raise your arm over your head. ? Avoid shrugging your shoulder while you raise your arm. Keep your shoulder blade tucked down toward the middle of your back. 3. Hold for __________ seconds. 4. Slowly return to the starting position. Repeat __________ times. Complete this exercise __________ times a day. Internal rotation  1. Place your left / right hand behind your back, palm up. 2. Use your other hand to dangle an exercise band, a towel, or a similar object over your shoulder. Grasp the band with your left / right hand so you are holding on to both ends. 3. Gently pull up on the band until you feel a stretch in the front of your left / right shoulder. The movement of your arm toward the center of your body is called internal rotation. ? Avoid shrugging your shoulder while you raise your arm. Keep your shoulder blade tucked down toward the middle of your back. 4. Hold for __________ seconds. 5. Release the stretch by letting go of the band and lowering your hands. Repeat __________ times. Complete this exercise __________ times a day. Strengthening exercises External rotation  1. Sit in a stable chair without armrests. 2. Secure an exercise band to a stable object at elbow height on your left / right side. 3. Place a soft object, such as a folded towel or a small pillow, between your left / right upper arm and your body to move your elbow about 4 inches (10 cm) away  from your side. 4. Hold the end of the exercise band so it is tight and there is no slack. 5. Keeping your elbow pressed against the soft object, slowly move your forearm out, away from your abdomen (external rotation). Keep your body steady so only your forearm moves. 6. Hold for __________ seconds. 7. Slowly return to the starting position. Repeat __________ times. Complete this exercise __________ times a day. Shoulder abduction  1. Sit in a stable chair without armrests, or stand up. 2. Hold a __________ weight in your left / right hand, or hold an exercise band with both hands. 3. Start with your arms straight down and your left / right palm facing in, toward your body. 4. Slowly lift your left / right hand out to your side (abduction). Do not lift your hand above shoulder height unless your health care provider tells you that this is safe. ? Keep your arms straight. ? Avoid shrugging your shoulder while you   do this movement. Keep your shoulder blade tucked down toward the middle of your back. 5. Hold for __________ seconds. 6. Slowly lower your arm, and return to the starting position. Repeat __________ times. Complete this exercise __________ times a day. Shoulder extension 1. Sit in a stable chair without armrests, or stand up. 2. Secure an exercise band to a stable object in front of you so it is at shoulder height. 3. Hold one end of the exercise band in each hand. Your palms should face each other. 4. Straighten your elbows and lift your hands up to shoulder height. 5. Step back, away from the secured end of the exercise band, until the band is tight and there is no slack. 6. Squeeze your shoulder blades together as you pull your hands down to the sides of your thighs (extension). Stop when your hands are straight down by your sides. Do not let your hands go behind your body. 7. Hold for __________ seconds. 8. Slowly return to the starting position. Repeat __________ times.  Complete this exercise __________ times a day. Shoulder row 1. Sit in a stable chair without armrests, or stand up. 2. Secure an exercise band to a stable object in front of you so it is at waist height. 3. Hold one end of the exercise band in each hand. Position your palms so that your thumbs are facing the ceiling (neutral position). 4. Bend each of your elbows to a 90-degree angle (right angle) and keep your upper arms at your sides. 5. Step back until the band is tight and there is no slack. 6. Slowly pull your elbows back behind you. 7. Hold for __________ seconds. 8. Slowly return to the starting position. Repeat __________ times. Complete this exercise __________ times a day. Shoulder press-ups  1. Sit in a stable chair that has armrests. Sit upright, with your feet flat on the floor. 2. Put your hands on the armrests so your elbows are bent and your fingers are pointing forward. Your hands should be about even with the sides of your body. 3. Push down on the armrests and use your arms to lift yourself off the chair. Straighten your elbows and lift yourself up as much as you comfortably can. ? Move your shoulder blades down, and avoid letting your shoulders move up toward your ears. ? Keep your feet on the ground. As you get stronger, your feet should support less of your body weight as you lift yourself up. 4. Hold for __________ seconds. 5. Slowly lower yourself back into the chair. Repeat __________ times. Complete this exercise __________ times a day. Wall push-ups  1. Stand so you are facing a stable wall. Your feet should be about one arm-length away from the wall. 2. Lean forward and place your palms on the wall at shoulder height. 3. Keep your feet flat on the floor as you bend your elbows and lean forward toward the wall. 4. Hold for __________ seconds. 5. Straighten your elbows to push yourself back to the starting position. Repeat __________ times. Complete this exercise  __________ times a day. This information is not intended to replace advice given to you by your health care provider. Make sure you discuss any questions you have with your health care provider. Document Released: 10/16/2005 Document Revised: 03/26/2019 Document Reviewed: 01/01/2019 Elsevier Patient Education  2020 [employer]  

## 2019-12-02 ENCOUNTER — Other Ambulatory Visit: Payer: Self-pay | Admitting: Rheumatology

## 2019-12-03 NOTE — Telephone Encounter (Signed)
Last Visit: 10/26/2019 Next Visit: 04/25/2020  Okay to refill per Dr. Deveshwar.  

## 2019-12-31 ENCOUNTER — Other Ambulatory Visit: Payer: Self-pay | Admitting: Rheumatology

## 2019-12-31 NOTE — Telephone Encounter (Signed)
Last Visit: 10/26/2019 Next Visit: 04/25/2020  Okay to refill per Dr. Corliss Skains.

## 2020-01-27 ENCOUNTER — Other Ambulatory Visit: Payer: Self-pay | Admitting: Rheumatology

## 2020-01-27 MED ORDER — TIZANIDINE HCL 4 MG PO CAPS: 4.0000 mg | ORAL_CAPSULE | Freq: Every day | ORAL | 0 refills | Status: DC

## 2020-01-27 NOTE — Telephone Encounter (Signed)
Ok to refill 

## 2020-01-27 NOTE — Telephone Encounter (Signed)
Last Visit: 10/26/2019 Next Visit: 04/25/2020  Okay to refill 90 day supply Tizanidine per patient request?

## 2020-01-27 NOTE — Telephone Encounter (Signed)
Patient called requesting prescription refill of Tizanidine to be sent to [facility] at [address].  Patient is also requesting additional refills for the prescription so she doesn't have to call every month.

## 2020-02-13 ENCOUNTER — Encounter (HOSPITAL_COMMUNITY): Payer: Self-pay

## 2020-02-13 ENCOUNTER — Other Ambulatory Visit: Payer: Self-pay

## 2020-02-13 ENCOUNTER — Ambulatory Visit (HOSPITAL_COMMUNITY)
Admission: EM | Admit: 2020-02-13 | Discharge: 2020-02-13 | Disposition: A | Discharge status: Home / Self Care | Payer: No Typology Code available for payment source | Attending: Physician Assistant | Admitting: Physician Assistant

## 2020-02-13 DIAGNOSIS — R0789 Other chest pain: Secondary | ICD-10-CM | POA: Insufficient documentation

## 2020-02-13 DIAGNOSIS — R002 Palpitations: Secondary | ICD-10-CM | POA: Diagnosis not present

## 2020-02-13 DIAGNOSIS — R0602 Shortness of breath: Secondary | ICD-10-CM | POA: Insufficient documentation

## 2020-02-13 LAB — BASIC METABOLIC PANEL
Anion gap: 14 (ref 5–15)
BUN: 13 mg/dL (ref 6–20)
CO2: 20 mmol/L — ABNORMAL LOW (ref 22–32)
Calcium: 9.7 mg/dL (ref 8.9–10.3)
Chloride: 106 mmol/L (ref 98–111)
Creatinine, Ser: 0.7 mg/dL (ref 0.44–1.00)
GFR calc Af Amer: >60 mL/min (ref >60)
GFR calc non Af Amer: >60 mL/min (ref >60)
Glucose, Bld: 104 mg/dL — ABNORMAL HIGH (ref 70–99)
Potassium: 3.7 mmol/L (ref 3.5–5.1)
Sodium: 140 mmol/L (ref 135–145)

## 2020-02-13 NOTE — Discharge Instructions (Addendum)
I want you to call the cardiology office supplied tomorrow morning to discuss being seen for further evaluation.  If you develop chest pain, shortness of breath, passout or have severe dizziness with these episodes at any point I want you to call 911/emergency medical services and go to the emergency department.  Rise from seated position carefully and slowly.  I have drawn labs and will call you with these results.  Please esume a normal diet for the time being and drink plenty of fluids.

## 2020-02-13 NOTE — ED Provider Notes (Signed)
[facility]    CSN: [id number] Arrival date & time: 02/13/20  1046      History   Chief Complaint Chief Complaint  Patient presents with  . Shortness of Breath    HPI Christine [name] is a 54 y.o. female.   Patient presents with a 2-day history of heart palpitations and shortness of breath.  She reports two incidents the 1st being Friday morning at 6 AM where she stood up and had racing heart with associated shortness of breath and chest pain.  He also reports lightheadedness during this episode as well as some mild nausea.  She was alerted by her apple watch saying that she may be in atrial fibrillation.  She reports rates up to 150 at that time.  She reports having racing heart with standing following that for the rest of Friday.  She had another incident on Saturday of which she stood up had racing heart to 150 with shortness of breath and chest tightness and pain.  She reports these two incidents with chest tightness and shortness of breath take about 30 minutes to reside and has otherwise denied chest pain or shortness of breath outside of those 2 incidents and denies chest pain in clinic today.  She does endorse some general fatigue over the last few days as well.  She denies any cardiac history or high blood pressure.  Denies history of diabetes.  She reports that if she is seated and rested she does not have episodes similar to these.  She reports a heart rate as low as 59 when laying down last night.  She is not currently reporting chest pain in clinic or shortness of breath in clinic.  She reports being on the keto diet.  She has attempted to take additional electrolytes.  She reports drinking a lot of water throughout the day to keep up with hydration.  She does not currently have primary care in the area and is searching for a primary care provider.     Past Medical History:  Diagnosis Date  . Allergy   . Diverticulitis   . Fibromyalgia     Patient Active  Problem List   Diagnosis Date Noted  . IBS (irritable bowel syndrome) 10/18/2016  . Fibromyalgia 10/18/2016  . Fatigue 10/18/2016  . Primary insomnia 10/18/2016  . 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 Trochanteric bursitis of both hips 10/18/2016  . Primary osteoarthritis of both knees 10/18/2016  . 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 Bilateral calcaneal spurs 10/18/2016  . Vitamin D deficiency 10/18/2016  . UTI (urinary tract infection) 11/08/2014  . Allergic rhinitis 02/21/2013  . Migraine 02/21/2013    Past Surgical History:  Procedure Laterality Date  . ABDOMINAL HYSTERECTOMY    . CESAREAN SECTION    . FINGER SURGERY     left ring finger x 2  . FRACTURE SURGERY    . TUBAL LIGATION      OB History   No obstetric history on file.      Home Medications    Prior to Admission medications   Medication Sig Start Date End Date Taking? Authorizing Provider  cholecalciferol (VITAMIN D) 1000 UNITS tablet Take 1,000 Units by mouth daily.    [provider]  dicyclomine (BENTYL) 10 MG/5ML syrup Take 20 mg by mouth as needed.     [provider]  fish oil-omega-3 fatty acids 1000 MG capsule Take 2 g by mouth daily.    [provider]  hyoscyamine (LEVSIN SL) 0.125 MG SL tablet Place under the tongue every 4 (four) hours as needed. 09/06/19   [provider]  methocarbamol (ROBAXIN) 500 MG tablet Take 1 tablet (500 mg total) by mouth 2 (two) times daily with breakfast and lunch. 04/28/19   Gearldine Bienenstock, PA-C  Multiple Vitamins-Minerals (MULTIVITAMIN WOMEN PO) Take 1 tablet by mouth daily.    [provider]  Probiotic Product (PROBIOTIC-10 PO) Take by mouth.    [provider]  RESTASIS 0.05 % ophthalmic emulsion INSTILL 1 DROP INTO EACH EYE TWICE A DAY 06/29/19   [provider]  tiZANidine (ZANAFLEX) 4 MG capsule Take 1 capsule (4 mg total) by mouth at bedtime. 01/27/20   Pollyann Savoy, MD  TURMERIC PO Take 900 mg by mouth daily.    [provider]  zolpidem (AMBIEN) 10 MG tablet Take 1 tablet (10 mg total) by mouth at bedtime as needed for sleep. 10/26/19   Gearldine Bienenstock, PA-C    Family History Family History  Problem Relation Age of Onset  . Asthma Mother   . Heart Problems Father   . Heart disease Brother   . Scoliosis Son     Social History Social History   Tobacco Use  . Smoking status: Former Smoker    Packs/day: 0.25    Years: 2.00    Pack years: 0.50    Types: Cigarettes    Quit date: 02/22/1988    Years since quitting: 31.9  . Smokeless tobacco: Never Used  Substance Use Topics  . Alcohol use: No  . Drug use: No     Allergies   Other, Penicillins, and Sulfa antibiotics   Review of Systems Review of Systems  Constitutional: Positive for fatigue. Negative for chills, diaphoresis and fever.  HENT: Negative.  Negative for ear pain and sore throat.   Eyes: Negative for pain and visual disturbance.  Respiratory: Positive for chest tightness and shortness of breath. Negative for cough.   Cardiovascular: Positive for chest pain and palpitations. Negative for leg swelling.  Gastrointestinal: Positive for nausea. Negative for abdominal pain, diarrhea and vomiting.  Genitourinary: Negative for dysuria and hematuria.  Musculoskeletal: Negative for arthralgias, back pain and myalgias.  Skin: Negative for color change and rash.  Neurological: Positive for light-headedness. Negative for seizures, syncope and headaches.  All other systems reviewed and are negative.    Physical Exam Triage Vital Signs ED Triage Vitals  Enc Vitals Group     BP 02/13/20 1112 135/82     Pulse Rate 02/13/20 1112 96     Resp 02/13/20 1112 (!) 21     Temp 02/13/20 1112 98.4 F (36.9 C)     Temp Source 02/13/20 1112 Oral     SpO2 02/13/20 1112 99 %     Weight 02/13/20 1109 250 lb (113.4 kg)     Height --      Head Circumference --      Peak Flow --       Pain Score 02/13/20 1109 0     Pain Loc --      Pain Edu? --      Excl. in GC? --    Orthostatic VS for the past 24 hrs:  BP- Lying Pulse- Lying BP- Sitting Pulse- Sitting BP- Standing at 0 minutes Pulse- Standing at 0 minutes  02/13/20 1231 150/90 89 (!) 164/100 87 (!) 158/122 106    Updated Vital Signs BP 135/82 (BP Location: Left Arm)  Pulse 96   Temp 98.4 F (36.9 C) (Oral)   Resp (!) 21   Wt 250 lb (113.4 kg)   LMP 04/02/2006   SpO2 99%   BMI 43.59 kg/m   Visual Acuity Right Eye Distance:   Left Eye Distance:   Bilateral Distance:    Right Eye Near:   Left Eye Near:    Bilateral Near:     Physical Exam Vitals and nursing note reviewed.  Constitutional:      General: She is not in acute distress.    Appearance: She is well-developed. She is not ill-appearing.  HENT:     Head: Normocephalic and atraumatic.  Eyes:     Extraocular Movements: Extraocular movements intact.     Conjunctiva/sclera: Conjunctivae normal.     Pupils: Pupils are equal, round, and reactive to light.  Neck:     Thyroid: No thyromegaly.     Vascular: No JVD.  Cardiovascular:     Rate and Rhythm: Normal rate and regular rhythm.     Heart sounds: No murmur. No friction rub. No gallop.   Pulmonary:     Effort: Pulmonary effort is normal. No respiratory distress.     Breath sounds: Normal breath sounds. No decreased breath sounds, wheezing, rhonchi or rales.  Chest:     Chest wall: No tenderness.  Abdominal:     Palpations: Abdomen is soft.     Tenderness: There is no abdominal tenderness.  Musculoskeletal:        General: Normal range of motion.     Cervical back: Normal range of motion and neck supple.     Right lower leg: No edema.     Left lower leg: No edema.  Skin:    General: Skin is warm and dry.  Neurological:     General: No focal deficit present.     Mental Status: She is alert and oriented to person, place, and time.  Psychiatric:        Mood and Affect: Mood  normal.        Behavior: Behavior normal.      UC Treatments / Results  Labs (all labs ordered are listed, but only abnormal results are displayed) Labs Reviewed  BASIC METABOLIC PANEL    EKG Normal sinus rhythm with nonspecific T wave inversion in V1.  Low voltage suspected due to body habitus.  Radiology No results found.  Procedures Procedures (including critical care time)  Medications Ordered in UC Medications - No data to display  Initial Impression / Assessment and Plan / UC Course  I have reviewed the triage vital signs and the nursing notes.  Pertinent labs & imaging results that were available during my care of the patient were reviewed by me and considered in my medical decision making (see chart for details).     #Palpitations #Shortness of breath #Chest tightness Patient is a 54 year old female with no chronic medical history reporting with 2 days of heart palpitations with associated chest tightness and shortness of breath.  EKG normal sinus rhythm.  Orthostatic vital signs unrevealing but with evidence of increased heart rate with postural changes.  Denying chest pain or shortness of breath in clinic today.  Differential of paroxysmal atrial fibrillation, pots syndrome, other paroxysmal tachyarrhythmia.  Given she is not acutely symptomatic feel that can discharge to home with strict precautions as below. - Advised patient to call cardiology office tomorrow for evaluation.   - Strict emergency department cautions were discussed and patient understands this  plan. -BMP unrevealing potassium 3.7 and sodium at 140.  Slight decrease in CO2 but not alarming.  Glucose at 104-we will notify patient of these results   Final Clinical Impressions(s) / UC Diagnoses   Final diagnoses:  Palpitations  Shortness of breath  Chest tightness     Discharge Instructions     I want you to call the cardiology office supplied tomorrow morning to discuss being seen for  further evaluation.  If you develop chest pain, shortness of breath, passout or have severe dizziness with these episodes at any point I want you to call 911/emergency medical services and go to the emergency department.  Rise from seated position carefully and slowly.  I have drawn labs and will call you with these results.  When she to resume a normal diet and drink plenty of fluids.      ED Prescriptions    None     PDMP not reviewed this encounter.   Purnell Shoemaker, PA-C 02/13/20 1409

## 2020-02-13 NOTE — ED Triage Notes (Signed)
Pt is here with a racing heart rate & SOB that started Friday morning. States she is back on a Keto diet, her Apple watch constantly alerted her since.

## 2020-02-15 ENCOUNTER — Other Ambulatory Visit: Payer: Self-pay

## 2020-02-15 ENCOUNTER — Emergency Department (HOSPITAL_COMMUNITY): Payer: No Typology Code available for payment source

## 2020-02-15 ENCOUNTER — Encounter (HOSPITAL_COMMUNITY): Payer: Self-pay | Admitting: Emergency Medicine

## 2020-02-15 ENCOUNTER — Emergency Department (HOSPITAL_COMMUNITY)
Admission: EM | Admit: 2020-02-15 | Discharge: 2020-02-15 | Disposition: A | Discharge status: Home / Self Care | Payer: No Typology Code available for payment source | Attending: Emergency Medicine | Admitting: Emergency Medicine

## 2020-02-15 DIAGNOSIS — R42 Dizziness and giddiness: Secondary | ICD-10-CM | POA: Diagnosis not present

## 2020-02-15 DIAGNOSIS — Z20822 Contact with and (suspected) exposure to covid-19: Secondary | ICD-10-CM | POA: Insufficient documentation

## 2020-02-15 DIAGNOSIS — Z79899 Other long term (current) drug therapy: Secondary | ICD-10-CM | POA: Insufficient documentation

## 2020-02-15 DIAGNOSIS — Z87891 Personal history of nicotine dependence: Secondary | ICD-10-CM | POA: Diagnosis not present

## 2020-02-15 DIAGNOSIS — R002 Palpitations: Secondary | ICD-10-CM | POA: Diagnosis present

## 2020-02-15 LAB — D-DIMER, QUANTITATIVE: D-Dimer, Quant: <0.27 ug/mL-FEU (ref 0.00–0.50)

## 2020-02-15 LAB — CBC WITH DIFFERENTIAL/PLATELET
Abs Immature Granulocytes: 0.01 10*3/uL (ref 0.00–0.07)
Basophils Absolute: 0.1 10*3/uL (ref 0.0–0.1)
Basophils Relative: 1 %
Eosinophils Absolute: 0.1 10*3/uL (ref 0.0–0.5)
Eosinophils Relative: 2 %
HCT: 48.7 % — ABNORMAL HIGH (ref 36.0–46.0)
Hemoglobin: 15.9 g/dL — ABNORMAL HIGH (ref 12.0–15.0)
Immature Granulocytes: 0 %
Lymphocytes Relative: 30 %
Lymphs Abs: 2 10*3/uL (ref 0.7–4.0)
MCH: 30 pg (ref 26.0–34.0)
MCHC: 32.6 g/dL (ref 30.0–36.0)
MCV: 91.9 fL (ref 80.0–100.0)
Monocytes Absolute: 0.5 10*3/uL (ref 0.1–1.0)
Monocytes Relative: 7 %
Neutro Abs: 3.9 10*3/uL (ref 1.7–7.7)
Neutrophils Relative %: 60 %
Platelets: 322 10*3/uL (ref 150–400)
RBC: 5.3 MIL/uL — ABNORMAL HIGH (ref 3.87–5.11)
RDW: 12.9 % (ref 11.5–15.5)
WBC: 6.5 10*3/uL (ref 4.0–10.5)
nRBC: 0 % (ref 0.0–0.2)

## 2020-02-15 LAB — BASIC METABOLIC PANEL
Anion gap: 11 (ref 5–15)
BUN: 15 mg/dL (ref 6–20)
CO2: 26 mmol/L (ref 22–32)
Calcium: 9.8 mg/dL (ref 8.9–10.3)
Chloride: 105 mmol/L (ref 98–111)
Creatinine, Ser: 0.84 mg/dL (ref 0.44–1.00)
GFR calc Af Amer: >60 mL/min (ref >60)
GFR calc non Af Amer: >60 mL/min (ref >60)
Glucose, Bld: 117 mg/dL — ABNORMAL HIGH (ref 70–99)
Potassium: 3.5 mmol/L (ref 3.5–5.1)
Sodium: 142 mmol/L (ref 135–145)

## 2020-02-15 LAB — URINALYSIS, ROUTINE W REFLEX MICROSCOPIC
Bilirubin Urine: NEGATIVE
Glucose, UA: NEGATIVE mg/dL
Hgb urine dipstick: NEGATIVE
Ketones, ur: NEGATIVE mg/dL
Leukocytes,Ua: NEGATIVE
Nitrite: NEGATIVE
Protein, ur: NEGATIVE mg/dL
Specific Gravity, Urine: 1.01 (ref 1.005–1.030)
pH: 6 (ref 5.0–8.0)

## 2020-02-15 LAB — TROPONIN I (HIGH SENSITIVITY)
Troponin I (High Sensitivity): 13 ng/L (ref <18)
Troponin I (High Sensitivity): 16 ng/L (ref <18)

## 2020-02-15 MED ORDER — SODIUM CHLORIDE 0.9 % IV BOLUS
1000.0000 mL | Freq: Once | INTRAVENOUS | Status: AC
  Administered 2020-02-15: 1000 mL via INTRAVENOUS

## 2020-02-15 MED ORDER — LACTATED RINGERS IV BOLUS
1000.0000 mL | Freq: Once | INTRAVENOUS | Status: AC
  Administered 2020-02-15: 19:00:00 1000 mL via INTRAVENOUS

## 2020-02-15 NOTE — Discharge Instructions (Signed)
Please make sure you are drinking enough water.  Please give your body time to adjust when going from laying to sitting to standing to walking.  Please consider increasing the salt in your diet.

## 2020-02-15 NOTE — ED Provider Notes (Signed)
[facility] EMERGENCY DEPARTMENT Provider Note   CSN: [id number] Arrival date & time: 02/15/20  1616     History Chief Complaint  Patient presents with  . Palpitations    Christine [name] is a 54 y.o. female with a past medical history of IBS, fibromyalgia, status post hysterectomy, who presents today for evaluation of feeling like her heart rate was increased and her chest was tight.  She reports that since Friday she has had 3 episodes of palpitations. Initially on Friday her Apple Watch reportedly alerted her that she was in A. fib.  She states that today she had an episode of about 2-3 PM it lasted about 10 to 15 minutes during which she felt like her heart was racing, she felt lightheaded short of breath and pressure on her chest.  She reports that the episode today her Apple Watch did not state A. fib rather was "inconclusive." She denies any significant changes to diet.  She drinks 1 cup of coffee a day which is unchanged from baseline.  No other stimulants.  She denies any changes in medications including no changes in dosing, addition or removal of medications. She denies any significant leg swelling or hemoptysis. Does not use any hormones.      HPI     Past Medical History:  Diagnosis Date  . Allergy   . Diverticulitis   . Fibromyalgia     Patient Active Problem List   Diagnosis Date Noted  . IBS (irritable bowel syndrome) 10/18/2016  . Fibromyalgia 10/18/2016  . Fatigue 10/18/2016  . Primary insomnia 10/18/2016  . 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 Trochanteric bursitis of both hips 10/18/2016  . Primary osteoarthritis of both knees 10/18/2016  . 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 Bilateral calcaneal spurs 10/18/2016  . Vitamin D deficiency 10/18/2016  . UTI (urinary tract infection) 11/08/2014  . Allergic rhinitis 02/21/2013  . Migraine 02/21/2013    Past Surgical History:  Procedure Laterality  Date  . ABDOMINAL HYSTERECTOMY    . CESAREAN SECTION    . FINGER SURGERY     left ring finger x 2  . FRACTURE SURGERY    . TUBAL LIGATION       OB History   No obstetric history on file.     Family History  Problem Relation Age of Onset  . Asthma Mother   . Heart Problems Father   . Heart disease Brother   . Scoliosis Son     Social History   Tobacco Use  . Smoking status: Former Smoker    Packs/day: 0.25    Years: 2.00    Pack years: 0.50    Types: Cigarettes    Quit date: 02/22/1988    Years since quitting: 32.0  . Smokeless tobacco: Never Used  Substance Use Topics  . Alcohol use: No  . Drug use: No    Home Medications Prior to Admission medications   Medication Sig Start Date End Date Taking? Authorizing Provider  cholecalciferol (VITAMIN D) 1000 UNITS tablet Take 1,000 Units by mouth at bedtime.    Yes [provider]  fish oil-omega-3 fatty acids 1000 MG capsule Take 1 g by mouth 2 (two) times daily.    Yes [provider]  hyoscyamine (LEVSIN SL) 0.125 MG SL tablet Place 0.125 mg under the tongue every 4 (four) hours as needed for cramping.  09/06/19  Yes [provider]  MAGNESIUM PO Take 1 tablet by mouth daily.  Yes [provider]  methocarbamol (ROBAXIN) 500 MG tablet Take 1 tablet (500 mg total) by mouth 2 (two) times daily with breakfast and lunch. Patient taking differently: Take 500 mg by mouth as needed for muscle spasms (pain).  04/28/19  Yes Gearldine Bienenstock, PA-C  Multiple Vitamins-Minerals (MULTIVITAMIN WOMEN PO) Take 1 tablet by mouth daily.   Yes [provider]  Probiotic Product (PROBIOTIC-10 PO) Take 1 tablet by mouth daily.    Yes [provider]  RESTASIS 0.05 % ophthalmic emulsion Place 1 drop into both eyes 2 (two) times daily.  06/29/19  Yes [provider]  tiZANidine (ZANAFLEX) 4 MG capsule Take 1 capsule (4 mg total) by mouth at bedtime. 01/27/20  Yes Deveshwar, Janalyn Rouse, MD   TURMERIC PO Take 900 mg by mouth daily.   Yes [provider]  zolpidem (AMBIEN) 10 MG tablet Take 1 tablet (10 mg total) by mouth at bedtime as needed for sleep. Patient taking differently: Take 5 mg by mouth at bedtime as needed for sleep.  10/26/19  Yes Gearldine Bienenstock, PA-C    Allergies    Other, Penicillins, and Sulfa antibiotics  Review of Systems   Review of Systems  Constitutional: Negative for chills and fever.  HENT: Negative for congestion.   Respiratory: Positive for chest tightness and shortness of breath.   Cardiovascular: Positive for chest pain and palpitations.  Gastrointestinal: Negative for abdominal pain, diarrhea, nausea and vomiting.  Genitourinary: Negative for dysuria.  Musculoskeletal: Negative for back pain and neck pain.       Fibromyalgia  Neurological: Positive for light-headedness. Negative for dizziness, weakness and headaches.  All other systems reviewed and are negative.   Physical Exam Updated Vital Signs BP 140/81   Pulse 75   Temp 98.1 F (36.7 C) (Oral)   Resp (!) 8   LMP 04/02/2006   SpO2 100%   Physical Exam Vitals and nursing note reviewed.  Constitutional:      General: She is not in acute distress.    Appearance: She is well-developed. She is not ill-appearing.  HENT:     Head: Normocephalic and atraumatic.     Mouth/Throat:     Mouth: Mucous membranes are moist.  Eyes:     Conjunctiva/sclera: Conjunctivae normal.  Cardiovascular:     Rate and Rhythm: Normal rate and regular rhythm.     Pulses: Normal pulses.     Heart sounds: Normal heart sounds. No murmur.  Pulmonary:     Effort: Pulmonary effort is normal. No respiratory distress.     Breath sounds: Normal breath sounds.  Abdominal:     General: There is no distension.     Palpations: Abdomen is soft.     Tenderness: There is no abdominal tenderness. There is no guarding.  Musculoskeletal:     Cervical back: Neck supple.     Comments: Diffuse TTP over  bilateral proximal calfs.   Edema evaluation limited due to body habitus but appear symmetrical.   Skin:    General: Skin is warm and dry.  Neurological:     General: No focal deficit present.     Mental Status: She is alert.     Cranial Nerves: No cranial nerve deficit.  Psychiatric:        Mood and Affect: Mood normal.        Behavior: Behavior normal.     ED Results / Procedures / Treatments   Labs (all labs ordered are listed, but only abnormal  results are displayed) Labs Reviewed  CBC WITH DIFFERENTIAL/PLATELET - Abnormal; Notable for the following components:      Result Value   RBC 5.30 (*)    Hemoglobin 15.9 (*)    HCT 48.7 (*)    All other components within normal limits  BASIC METABOLIC PANEL - Abnormal; Notable for the following components:   Glucose, Bld 117 (*)    All other components within normal limits  URINALYSIS, ROUTINE W REFLEX MICROSCOPIC - Abnormal; Notable for the following components:   APPearance HAZY (*)    All other components within normal limits  SARS CORONAVIRUS 2 (TAT 6-24 HRS)  D-DIMER, QUANTITATIVE (NOT AT [facility])  TROPONIN I (HIGH SENSITIVITY)  TROPONIN I (HIGH SENSITIVITY)    EKG EKG Interpretation  Date/Time:  Tuesday February 15 2020 16:21:58 EST Ventricular Rate:  79 PR Interval:    QRS Duration: 88 QT Interval:  385 QTC Calculation: 442 R Axis:   73 Text Interpretation: Sinus rhythm Low voltage, precordial leads No significant change since last tracing Confirmed by Melene Plan [phone]) on 02/15/2020 4:25:02 PM   Radiology DG Chest Port 1 View  Result Date: 02/15/2020 CLINICAL DATA:  Palpitations. EXAM: PORTABLE CHEST 1 VIEW COMPARISON:  April 13, 2007. FINDINGS: The heart size and mediastinal contours are within normal limits. Both lungs are clear. The visualized skeletal structures are unremarkable. IMPRESSION: No active disease. Electronically Signed   By: Lupita Raider M.D.   On: 02/15/2020 16:45    Procedures Procedures  (including critical care time)  Medications Ordered in ED Medications  lactated ringers bolus 1,000 mL (0 mLs Intravenous Stopped 02/15/20 1845)  sodium chloride 0.9 % bolus 1,000 mL (0 mLs Intravenous Stopped 02/15/20 2130)    ED Course  I have reviewed the triage vital signs and the nursing notes.  Pertinent labs & imaging results that were available during my care of the patient were reviewed by me and considered in my medical decision making (see chart for details).  Clinical Course as of Feb 15 10  Tue Feb 15, 2020  2115 Patient still feels dizzy when sitting up.     [EH]    Clinical Course User Index [EH] Cristina Gong, PA-C   Orthostatic VS for the past 24 hrs:  BP- Lying Pulse- Lying BP- Sitting Pulse- Sitting BP- Standing at 0 minutes Pulse- Standing at 0 minutes  02/15/20 2250 151/78 85 178/87 83 (!) 159/103 87  02/15/20 1713 159/85 84 (!) 165/99 83 (!) 181/111 115      MDM Rules/Calculators/A&P                     Patient is a 54 year old woman who presents today for evaluation of palpitations and rapid heart rate. Her Apple Watch once alerted her that she may be in A. fib however has not alerted her symptoms. On exam she is generally well-appearing.  Orthostatics were obtained showing significant change in heart rate from 83 sitting up to 115 while standing without hypotension, however patient was symptomatic. She was treated with 2 L of IV fluids after which her orthostatics improved significantly and she did not have significant heart rate changes with position.  Labs are obtained and reviewed, hemoglobin is 15.9 concerning for mild dehydration.  BMP is unremarkable.  Troponin x2 -.  Urine without evidence of infection.  D-dimer is negative.  Covid testing is sent.  Chest x-ray without evidence of consolidation or other acute abnormalities. EKG without evidence of acute  ischemia.  Patient has cardiology follow-up scheduled for Monday.  Prior to discharge  patient was able to ambulate in the department without difficulty. We discussed the importance of allowing body time for position changes.    Given negative d-dimer do not suspect PE.  Do not suspect ACS.  She did not have a-fib or other significant nonsinus rhythm while monitored in the emergency room.  We discussed the importance of p.o. fluid intake.  Return precautions were discussed with patient who states their understanding.  At the time of discharge patient denied any unaddressed complaints or concerns.  Patient is agreeable for discharge home.  Note: Portions of this report may have been transcribed using voice recognition software. Every effort was made to ensure accuracy; however, inadvertent computerized transcription errors may be present  Final Clinical Impression(s) / ED Diagnoses Final diagnoses:  Orthostatic lightheadedness    Rx / DC Orders ED Discharge Orders    None       Lorin Glass, PA-C 02/16/20 0015    Deno Etienne, DO 02/16/20 1500

## 2020-02-15 NOTE — ED Notes (Signed)
Urine culture also collected and sent to lab

## 2020-02-15 NOTE — ED Triage Notes (Signed)
Pt BIB [facility] from home. Pt complaining of 3 episodes of palpitations since Friday. Pt scheduled to see cardiologist on Monday but didn't want to wait to be seen. Per pt her potassium has been decreased and also was told to change her diet from keto Friday. VSS. NAD.

## 2020-02-15 NOTE — ED Notes (Signed)
PT reported feeling warmth in her legs followed by feeling lightheaded, dizzy, tingling in fingers, short of breath, and experiencing changes in vision while both sitting and standing. PT stated that the symptoms feel worse as the test progressed.

## 2020-02-16 LAB — SARS CORONAVIRUS 2 (TAT 6-24 HRS): SARS Coronavirus 2: NEGATIVE

## 2020-02-23 ENCOUNTER — Ambulatory Visit: Payer: No Typology Code available for payment source | Admitting: Cardiology

## 2020-02-23 ENCOUNTER — Other Ambulatory Visit: Payer: Self-pay

## 2020-02-23 ENCOUNTER — Telehealth: Payer: Self-pay | Admitting: Radiology

## 2020-02-23 VITALS — Temp 97.0°F | Ht 63.0 in | Wt 245.0 lb

## 2020-02-23 DIAGNOSIS — R42 Dizziness and giddiness: Secondary | ICD-10-CM | POA: Diagnosis not present

## 2020-02-23 DIAGNOSIS — R002 Palpitations: Secondary | ICD-10-CM | POA: Diagnosis not present

## 2020-02-23 NOTE — Progress Notes (Signed)
Cardiology Office Note:    Date:  02/23/2020   ID:  Christine [name], DOB 04-25-1966, MRN [id number]  PCP:  Ferd Hibbs, NP  Cardiologist:  No primary care provider on file.  Electrophysiologist:  None   Referring MD: Purnell Shoemaker, PA-C   Chief Complaint  Patient presents with  . Tachycardia    History of Present Illness:    Christine [name] is a 54 y.o. female with a hx of fibromyalgia, IBS who presents for ED follow-up.  She had a recent ED admission on 02/15/2020 with chest pain and palpitations.  States that for the past 2 weeks she has been having episodes of tachycardia.  Heart rate has been as high as 200s.  Typically occurs when standing, as has noticed rapid increase in heart rate.  She feels lightheaded during these episodes.  Has also noticed tachycardia when resting.  She feels like her heart is racing.  She does not exercise.  Denies any chest pain or dyspnea.  Given her symptoms, she went to the ED on 02/15/20.  Orthostatics in the ED showed increase in heart rate from 83-115 with standing, no hypotension.  She was given 2 L of IV fluid and orthostatics improved.  Labs unremarkable, Troponins negative x2.  EKG unremarkable.  D-dimer negative.  Reports that since the ED visit, she has continued to have symptoms of tachycardia and palpitations.  Labs drawn at PCP visit yesterday showed normal creatinine (0.9), Hemoglobin (15.9), potassium (4.6), LDL 117, A1c 5.5, TSH 3.27, magnesium 2.2.  Drinks 1 cup of coffee daily.  Rare alcohol use.  Uses THC gummy, has stopped to see if symptoms improved, but did not notice any difference.  Father died of MI in 33s.  Brother had MI in 52s.  Smoked in teens, quit years ago.     Past Medical History:  Diagnosis Date  . Allergy   . Diverticulitis   . Fibromyalgia     Past Surgical History:  Procedure Laterality Date  . ABDOMINAL HYSTERECTOMY    . CESAREAN SECTION    . FINGER SURGERY     left ring finger x 2  . FRACTURE SURGERY     . TUBAL LIGATION      Current Medications: Current Meds  Medication Sig  . cholecalciferol (VITAMIN D) 1000 UNITS tablet Take 10,000 Units by mouth at bedtime.   . fish oil-omega-3 fatty acids 1000 MG capsule Take 1 g by mouth 2 (two) times daily.   . hyoscyamine (LEVSIN SL) 0.125 MG SL tablet Place 0.125 mg under the tongue every 4 (four) hours as needed for cramping.   Marland Kitchen MAGNESIUM PO Take 1 tablet by mouth daily.  . methocarbamol (ROBAXIN) 500 MG tablet Take 1 tablet (500 mg total) by mouth 2 (two) times daily with breakfast and lunch. (Patient taking differently: Take 500 mg by mouth as needed for muscle spasms (pain). )  . Multiple Vitamins-Minerals (MULTIVITAMIN WOMEN PO) Take 1 tablet by mouth daily.  . Probiotic Product (PROBIOTIC-10 PO) Take 1 tablet by mouth daily.   Marland Kitchen tiZANidine (ZANAFLEX) 4 MG capsule Take 1 capsule (4 mg total) by mouth at bedtime.  . TURMERIC PO Take 900 mg by mouth daily.  Marland Kitchen zolpidem (AMBIEN) 10 MG tablet Take 1 tablet (10 mg total) by mouth at bedtime as needed for sleep. (Patient taking differently: Take 5 mg by mouth at bedtime as needed for sleep. )     Allergies:   Other, Penicillins, and Sulfa antibiotics  Social History   Socioeconomic History  . Marital status: Married    Spouse name: Not on file  . Number of children: Not on file  . Years of education: Not on file  . Highest education level: Not on file  Occupational History  . Occupation: Agricultural engineer: [employer]  Tobacco Use  . Smoking status: Former Smoker    Packs/day: 0.25    Years: 2.00    Pack years: 0.50    Types: Cigarettes    Quit date: 02/22/1988    Years since quitting: 32.0  . Smokeless tobacco: Never Used  Substance and Sexual Activity  . Alcohol use: No  . Drug use: No  . Sexual activity: Yes    Birth control/protection: Surgical  Other Topics Concern  . Not on file  Social History Narrative  . Not on file   Social Determinants of Health    Financial Resource Strain:   . Difficulty of Paying Living Expenses: Not on file  Food Insecurity:   . Worried About Programme researcher, broadcasting/film/video in the Last Year: Not on file  . Ran Out of Food in the Last Year: Not on file  Transportation Needs:   . Lack of Transportation (Medical): Not on file  . Lack of Transportation (Non-Medical): Not on file  Physical Activity:   . Days of Exercise per Week: Not on file  . Minutes of Exercise per Session: Not on file  Stress:   . Feeling of Stress : Not on file  Social Connections:   . Frequency of Communication with Friends and Family: Not on file  . Frequency of Social Gatherings with Friends and Family: Not on file  . Attends Religious Services: Not on file  . Active Member of Clubs or Organizations: Not on file  . Attends Banker Meetings: Not on file  . Marital Status: Not on file     Family History: The patient's family history includes Asthma in her mother; Heart Problems in her father; Heart disease in her brother; Scoliosis in her son.  ROS:   Please see the history of present illness.     All other systems reviewed and are negative.  EKGs/Labs/Other Studies Reviewed:    The following studies were reviewed today:   EKG:  EKG is  ordered today.  The ekg ordered  02/17/2020 shows sinus rhythm, no ST/T abnormalities.  Recent Labs: 02/15/2020: BUN 15; Creatinine, Ser 0.84; Hemoglobin 15.9; Platelets 322; Potassium 3.5; Sodium 142  Recent Lipid Panel No results found for: CHOL, TRIG, HDL, CHOLHDL, VLDL, LDLCALC, LDLDIRECT  Physical Exam:    VS:  Temp (!) 97 F (36.1 C)   Ht 5\' 3"  (1.6 m)   Wt 245 lb (111.1 kg)   LMP 04/02/2006   SpO2 100%   BMI 43.40 kg/m     Wt Readings from Last 3 Encounters:  02/23/20 245 lb (111.1 kg)  02/13/20 250 lb (113.4 kg)  10/26/19 251 lb 12.8 oz (114.2 kg)     GEN:  Well nourished, well developed in no acute distress HEENT: Normal NECK: No JVD; No carotid bruits LYMPHATICS: No  lymphadenopathy CARDIAC: RRR, no murmurs, rubs, gallops RESPIRATORY:  Clear to auscultation without rales, wheezing or rhonchi  ABDOMEN: Soft, non-tender, non-distended MUSCULOSKELETAL:  No edema; No deformity  SKIN: Warm and dry NEUROLOGIC:  Alert and oriented x 3 PSYCHIATRIC:  Normal affect   ASSESSMENT:    1. Palpitations   2. Lightheadedness  PLAN:    In order of problems listed above:  Palpitations/lightheadeness: Given significant increase in heart rate with standing, with no significant change in blood pressure, concerning for POTS.  Arrhythmia also on differential, will check Zio patch x3 days.  Will check TTE to rule out structural heart disease.  Will encourage to stay hydrated and recommend using compression stockings while awake.  RTC in 3 months    Medication Adjustments/Labs and Tests Ordered: Current medicines are reviewed at length with the patient today.  Concerns regarding medicines are outlined above.  Orders Placed This Encounter  Procedures  . LONG TERM MONITOR (3-14 DAYS)  . EKG 12-Lead  . ECHOCARDIOGRAM COMPLETE   No orders of the defined types were placed in this encounter.   Patient Instructions  Medication Instructions:  Your physician recommends that you continue on your current medications as directed. Please refer to the Current Medication list given to you today.  *If you need a refill on your cardiac medications before your next appointment, please call your pharmacy*   Lab Work: NONE (we will request labs from your primary care doctor)  Testing/Procedures: Your physician has requested that you have an echocardiogram. Echocardiography is a painless test that uses sound waves to create images of your heart. It provides your doctor with information about the size and shape of your heart and how well your heart's chambers and valves are working. This procedure takes approximately one hour. There are no restrictions for this procedure. This  will be done at our [facility] location:  [address]   ZIO XT- Long Term Monitor Instructions   Your physician has requested you wear your ZIO patch monitor 3 days.   This is a single patch monitor.  [employer] supplies one patch monitor per enrollment.  Additional stickers are not available.   Please do not apply patch if you will be having a Nuclear Stress Test, Echocardiogram, Cardiac CT, MRI, or Chest Xray during the time frame you would be wearing the monitor. The patch cannot be worn during these tests.  You cannot remove and re-apply the ZIO XT patch monitor.   Your ZIO patch monitor will be sent USPS Priority mail from [facility] directly to your home address. The monitor may also be mailed to a PO BOX if home delivery is not available.   It may take 3-5 days to receive your monitor after you have been enrolled.   Once you have received you monitor, please review enclosed instructions.  Your monitor has already been registered assigning a specific monitor serial # to you.   Applying the monitor   Shave hair from upper left chest.   Hold abrader disc by orange tab.  Rub abrader in 40 strokes over left upper chest as indicated in your monitor instructions.   Clean area with 4 enclosed alcohol pads .  Use all pads to assure are is cleaned thoroughly.  Let dry.   Apply patch as indicated in monitor instructions.  Patch will be place under collarbone on left side of chest with arrow pointing upward.   Rub patch adhesive wings for 2 minutes.Remove white label marked "1".  Remove white label marked "2".  Rub patch adhesive wings for 2 additional minutes.   While looking in a mirror, press and release button in center of patch.  A small green light will flash 3-4 times .  This will be your only indicator the monitor has been turned on.  Do not shower for the first 24 hours.  You may shower after the first 24 hours.   Press button if you feel a symptom.  You will hear a small click.  Record Date, Time and Symptom in the Patient Log Book.   When you are ready to remove patch, follow instructions on last 2 pages of Patient Log Book.  Stick patch monitor onto last page of Patient Log Book.   Place Patient Log Book in [place] box.  Use locking tab on box and tape box closed securely.  The [employer] and [employer] has [employer] on it.  Please place in mailbox as soon as possible.  Your physician should have your test results approximately 7 days after the monitor has been mailed back to [facility]   Call [facility] Customer Care at [phone] if you have questions regarding your ZIO XT patch monitor.  Call them immediately if you see an orange light blinking on your monitor.   If your monitor falls off in less than 4 days contact our Monitor department at [phone].  If your monitor becomes loose or falls off after 4 days call [employer] at [phone] for suggestions on securing your monitor.      Follow-Up: At [facility], you and your health needs are our priority.  As part of our continuing mission to provide you with exceptional heart care, we have created designated Provider Care Teams.  These Care Teams include your primary Cardiologist (physician) and Advanced Practice Providers (APPs -  Physician Assistants and Nurse Practitioners) who all work together to provide you with the care you need, when you need it.  We recommend signing up for the patient portal called "MyChart".  Sign up information is provided on this After Visit Summary.  MyChart is used to connect with patients for Virtual Visits (Telemedicine).  Patients are able to view lab/test results, encounter notes, upcoming appointments, etc.  Non-urgent messages can be sent to your provider as well.   To learn more about what you can do with MyChart, go to [email or web address].    Your next appointment:   3 month(s)  The format for your next appointment:   Either  In Person or Virtual  Provider:   Epifanio Lesches, MD      Signed, Little Ishikawa, MD  02/23/2020 6:00 PM    [facility]

## 2020-02-23 NOTE — Telephone Encounter (Signed)
Enrolled patient for a 3 day Zio monitor to be mailed to patients home.  

## 2020-02-23 NOTE — Patient Instructions (Signed)
Medication Instructions:  Your physician recommends that you continue on your current medications as directed. Please refer to the Current Medication list given to you today.  *If you need a refill on your cardiac medications before your next appointment, please call your pharmacy*   Lab Work: NONE (we will request labs from your primary care doctor)  Testing/Procedures: Your physician has requested that you have an echocardiogram. Echocardiography is a painless test that uses sound waves to create images of your heart. It provides your doctor with information about the size and shape of your heart and how well your heart's chambers and valves are working. This procedure takes approximately one hour. There are no restrictions for this procedure. This will be done at our [facility] location:  [address]   ZIO XT- Long Term Monitor Instructions   Your physician has requested you wear your ZIO patch monitor 3 days.   This is a single patch monitor.  [employer] supplies one patch monitor per enrollment.  Additional stickers are not available.   Please do not apply patch if you will be having a Nuclear Stress Test, Echocardiogram, Cardiac CT, MRI, or Chest Xray during the time frame you would be wearing the monitor. The patch cannot be worn during these tests.  You cannot remove and re-apply the ZIO XT patch monitor.   Your ZIO patch monitor will be sent USPS Priority mail from [facility] directly to your home address. The monitor may also be mailed to a PO BOX if home delivery is not available.   It may take 3-5 days to receive your monitor after you have been enrolled.   Once you have received you monitor, please review enclosed instructions.  Your monitor has already been registered assigning a specific monitor serial # to you.   Applying the monitor   Shave hair from upper left chest.   Hold abrader disc by orange tab.  Rub abrader in 40 strokes over left  upper chest as indicated in your monitor instructions.   Clean area with 4 enclosed alcohol pads .  Use all pads to assure are is cleaned thoroughly.  Let dry.   Apply patch as indicated in monitor instructions.  Patch will be place under collarbone on left side of chest with arrow pointing upward.   Rub patch adhesive wings for 2 minutes.Remove white label marked "1".  Remove white label marked "2".  Rub patch adhesive wings for 2 additional minutes.   While looking in a mirror, press and release button in center of patch.  A small green light will flash 3-4 times .  This will be your only indicator the monitor has been turned on.     Do not shower for the first 24 hours.  You may shower after the first 24 hours.   Press button if you feel a symptom. You will hear a small click.  Record Date, Time and Symptom in the Patient Log Book.   When you are ready to remove patch, follow instructions on last 2 pages of Patient Log Book.  Stick patch monitor onto last page of Patient Log Book.   Place Patient Log Book in [place] box.  Use locking tab on box and tape box closed securely.  The [employer] has [employer] on it.  Please place in mailbox as soon as possible.  Your physician should have your test results approximately 7 days after the monitor has been mailed back to  [employer].   Call [place] at [phone] if you have questions regarding your ZIO XT patch monitor.  Call them immediately if you see an orange light blinking on your monitor.   If your monitor falls off in less than 4 days contact our Monitor department at [phone].  If your monitor becomes loose or falls off after 4 days call [employer] at [phone] for suggestions on securing your monitor.      Follow-Up: At [facility], you and your health needs are our priority.  As part of our continuing mission to provide you with exceptional heart care, we have created designated Provider  Care Teams.  These Care Teams include your primary Cardiologist (physician) and Advanced Practice Providers (APPs -  Physician Assistants and Nurse Practitioners) who all work together to provide you with the care you need, when you need it.  We recommend signing up for the patient portal called "MyChart".  Sign up information is provided on this After Visit Summary.  MyChart is used to connect with patients for Virtual Visits (Telemedicine).  Patients are able to view lab/test results, encounter notes, upcoming appointments, etc.  Non-urgent messages can be sent to your provider as well.   To learn more about what you can do with MyChart, go to [email or web address].    Your next appointment:   3 month(s)  The format for your next appointment:   Either In Person or Virtual  Provider:   Oswaldo Milian, MD

## 2020-02-24 ENCOUNTER — Ambulatory Visit (INDEPENDENT_AMBULATORY_CARE_PROVIDER_SITE_OTHER): Payer: No Typology Code available for payment source

## 2020-02-24 ENCOUNTER — Encounter (HOSPITAL_COMMUNITY): Payer: Self-pay

## 2020-02-24 ENCOUNTER — Other Ambulatory Visit: Payer: Self-pay

## 2020-02-24 ENCOUNTER — Ambulatory Visit (HOSPITAL_COMMUNITY)
Admission: EM | Admit: 2020-02-24 | Discharge: 2020-02-24 | Disposition: A | Discharge status: Home / Self Care | Payer: No Typology Code available for payment source | Attending: Family Medicine | Admitting: Family Medicine

## 2020-02-24 DIAGNOSIS — M79674 Pain in right toe(s): Secondary | ICD-10-CM

## 2020-02-24 DIAGNOSIS — W541XXA Struck by dog, initial encounter: Secondary | ICD-10-CM

## 2020-02-24 DIAGNOSIS — S92501A Displaced unspecified fracture of right lesser toe(s), initial encounter for closed fracture: Secondary | ICD-10-CM | POA: Diagnosis not present

## 2020-02-24 MED ORDER — HYDROCODONE-ACETAMINOPHEN 7.5-325 MG PO TABS: 1.0000 | ORAL_TABLET | Freq: Four times a day (QID) | ORAL | 0 refills | Status: DC | PRN

## 2020-02-24 NOTE — ED Provider Notes (Signed)
[facility]    CSN: [id number] Arrival date & time: 02/24/20  1529      History   Chief Complaint Chief Complaint  Patient presents with  . Toe Injury    HPI Christine [name] is a 54 y.o. female.   HPI  Patient states she was sitting on her bed.  Her dog flew up on the bed to greet her and accidentally hit her fifth toe on the right foot.  It is swollen, painful, and deformed.  She is here for possible toe fracture. Patient is being worked up for dizzy spells.  She saw cardiology.  It is possible that she has POTS.  She has been told to use compression stockings.  Additional testing is ordered.  She is compliant with medications.  Past Medical History:  Diagnosis Date  . Allergy   . Diverticulitis   . Fibromyalgia     Patient Active Problem List   Diagnosis Date Noted  . IBS (irritable bowel syndrome) 10/18/2016  . Fibromyalgia 10/18/2016  . Fatigue 10/18/2016  . Primary insomnia 10/18/2016  . 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 Trochanteric bursitis of both hips 10/18/2016  . Primary osteoarthritis of both knees 10/18/2016  . 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 Bilateral calcaneal spurs 10/18/2016  . Vitamin D deficiency 10/18/2016  . UTI (urinary tract infection) 11/08/2014  . Allergic rhinitis 02/21/2013  . Migraine 02/21/2013    Past Surgical History:  Procedure Laterality Date  . ABDOMINAL HYSTERECTOMY    . CESAREAN SECTION    . FINGER SURGERY     left ring finger x 2  . FRACTURE SURGERY    . TUBAL LIGATION      OB History   No obstetric history on file.      Home Medications    Prior to Admission medications   Medication Sig Start Date End Date Taking? Authorizing Provider  cholecalciferol (VITAMIN D) 1000 UNITS tablet Take 10,000 Units by mouth at bedtime.     [provider]  fish oil-omega-3 fatty acids 1000 MG capsule Take 1 g by mouth 2 (two) times daily.     [provider]  HYDROcodone-acetaminophen (NORCO) 7.5-325 MG tablet Take 1 tablet by mouth every 6 (six) hours as needed for moderate pain. 02/24/20   Eustace Moore, MD  hyoscyamine (LEVSIN SL) 0.125 MG SL tablet Place 0.125 mg under the tongue every 4 (four) hours as needed for cramping.  09/06/19   [provider]  MAGNESIUM PO Take 1 tablet by mouth daily.    [provider]  methocarbamol (ROBAXIN) 500 MG tablet Take 1 tablet (500 mg total) by mouth 2 (two) times daily with breakfast and lunch. Patient taking differently: Take 500 mg by mouth as needed for muscle spasms (pain).  04/28/19   Gearldine Bienenstock, PA-C  Multiple Vitamins-Minerals (MULTIVITAMIN WOMEN PO) Take 1 tablet by mouth daily.    [provider]  Probiotic Product (PROBIOTIC-10 PO) Take 1 tablet by mouth daily.     [provider]  RESTASIS 0.05 % ophthalmic emulsion Place 1 drop into both eyes 2 (two) times daily.  06/29/19   [provider]  tiZANidine (ZANAFLEX) 4 MG capsule Take 1 capsule (4 mg total) by mouth at bedtime. 01/27/20   Pollyann Savoy, MD  TURMERIC PO Take 900 mg by mouth daily.    [provider]  zolpidem (AMBIEN) 10 MG tablet Take 1 tablet (10 mg total) by mouth at  bedtime as needed for sleep. Patient taking differently: Take 5 mg by mouth at bedtime as needed for sleep.  10/26/19   Gearldine Bienenstock, PA-C    Family History Family History  Problem Relation Age of Onset  . Asthma Mother   . Heart Problems Father   . Heart disease Brother   . Scoliosis Son     Social History Social History   Tobacco Use  . Smoking status: Former Smoker    Packs/day: 0.25    Years: 2.00    Pack years: 0.50    Types: Cigarettes    Quit date: 02/22/1988    Years since quitting: 32.0  . Smokeless tobacco: Never Used  Substance Use Topics  . Alcohol use: No  . Drug use: No     Allergies   Other, Penicillins, and Sulfa antibiotics   Review of Systems Review of Systems   Musculoskeletal: Positive for arthralgias, gait problem and joint swelling.     Physical Exam Triage Vital Signs ED Triage Vitals  Enc Vitals Group     BP 02/24/20 1613 131/84     Pulse Rate 02/24/20 1613 97     Resp 02/24/20 1613 17     Temp 02/24/20 1613 98.2 F (36.8 C)     Temp Source 02/24/20 1613 Oral     SpO2 02/24/20 1613 100 %     Weight --      Height --      Head Circumference --      Peak Flow --      Pain Score 02/24/20 1614 8     Pain Loc --      Pain Edu? --      Excl. in GC? --    No data found.  Updated Vital Signs BP 131/84 (BP Location: Right Arm)   Pulse 97   Temp 98.2 F (36.8 C) (Oral)   Resp 17   LMP 04/02/2006   SpO2 100%   Visual Acuity Right Eye Distance:   Left Eye Distance:   Bilateral Distance:    Right Eye Near:   Left Eye Near:    Bilateral Near:     Physical Exam Constitutional:      General: She is not in acute distress.    Appearance: She is well-developed. She is obese.  HENT:     Head: Normocephalic and atraumatic.     Mouth/Throat:     Comments: Mask in place Eyes:     Conjunctiva/sclera: Conjunctivae normal.     Pupils: Pupils are equal, round, and reactive to light.  Cardiovascular:     Rate and Rhythm: Normal rate.  Pulmonary:     Effort: Pulmonary effort is normal. No respiratory distress.  Musculoskeletal:        General: Normal range of motion.     Cervical back: Normal range of motion.     Comments: Fifth toe on right foot is deviated away from the other toes.  There is swelling.  Very tender.  Skin:    General: Skin is warm and dry.  Neurological:     Mental Status: She is alert.  Psychiatric:        Mood and Affect: Mood normal.        Behavior: Behavior normal.      UC Treatments / Results  Labs (all labs ordered are listed, but only abnormal results are displayed) Labs Reviewed - No data to display  EKG   Radiology DG Toe 5th Right  Result Date: 02/24/2020 CLINICAL DATA:  Right fifth  toe pain after injury today. Deformity. EXAM: RIGHT FIFTH TOE COMPARISON:  None. FINDINGS: Oblique displaced fracture through the fifth toe proximal phalanx. No intra-articular extension. Distal phalanx is intact. No additional fracture. IMPRESSION: Oblique displaced fifth toe proximal phalanx fracture. Electronically Signed   By: Keith Rake M.D.   On: 02/24/2020 16:32    Procedures Procedures (including critical care time)  Medications Ordered in UC Medications - No data to display  Initial Impression / Assessment and Plan / UC Course  I have reviewed the triage vital signs and the nursing notes.  Pertinent labs & imaging results that were available during my care of the patient were reviewed by me and considered in my medical decision making (see chart for details).     I called and arrange for patient to be seen by orthopedics tomorrow.  She is placed into a fracture shoe, hard bottom. Final Clinical Impressions(s) / UC Diagnoses   Final diagnoses:  Closed fracture of phalanx of right fifth toe, initial encounter     Discharge Instructions     Use ice and elevation to reduce pain and swelling Walk as little as you can manage the rest of the day Presented to [facility] at 8 AM tomorrow morning for an evaluation Take hydrocodone as needed for pain.  Do not drive on hydrocodone.  Take hydrocodone with food.    ED Prescriptions    Medication Sig Dispense Auth. Provider   HYDROcodone-acetaminophen (NORCO) 7.5-325 MG tablet Take 1 tablet by mouth every 6 (six) hours as needed for moderate pain. 15 tablet Raylene Everts, MD     I have reviewed the PDMP during this encounter.   Raylene Everts, MD 02/24/20 [phone]

## 2020-02-24 NOTE — Discharge Instructions (Addendum)
Use ice and elevation to reduce pain and swelling Walk as little as you can manage the rest of the day Presented to [facility] at 8 AM tomorrow morning for an evaluation Take hydrocodone as needed for pain.  Do not drive on hydrocodone.  Take hydrocodone with food.

## 2020-02-24 NOTE — ED Triage Notes (Signed)
Pt presents with right pinky toe injury after playing with her dog and him landing on it.

## 2020-02-26 ENCOUNTER — Other Ambulatory Visit (INDEPENDENT_AMBULATORY_CARE_PROVIDER_SITE_OTHER): Payer: No Typology Code available for payment source

## 2020-02-26 DIAGNOSIS — R002 Palpitations: Secondary | ICD-10-CM

## 2020-03-10 ENCOUNTER — Telehealth: Payer: Self-pay | Admitting: Cardiology

## 2020-03-10 NOTE — Telephone Encounter (Signed)
We are recommending the COVID-19 vaccine to all of our patients. Cardiac medications (including blood thinners) should not deter anyone from being vaccinated and there is no need to hold any of those medications prior to vaccine administration.     Currently, there is a hotline to call (active 12/24/19) to schedule vaccination appointments as no walk-ins will be accepted.   Number: [phone].    If an appointment is not available please go to [email or web address] to sign up for notification when additional vaccine appointments are available.   If you have further questions or concerns about the vaccine process, please visit [email or web address] or contact your primary care physician.   

## 2020-03-13 ENCOUNTER — Other Ambulatory Visit: Payer: Self-pay

## 2020-03-13 ENCOUNTER — Ambulatory Visit (HOSPITAL_COMMUNITY): Payer: No Typology Code available for payment source | Attending: Cardiology

## 2020-03-13 DIAGNOSIS — R002 Palpitations: Secondary | ICD-10-CM | POA: Diagnosis not present

## 2020-03-17 NOTE — Telephone Encounter (Signed)
Patient aware of results per MD MyChart message

## 2020-04-17 NOTE — Progress Notes (Deleted)
Office Visit Note  Patient: Christine [name]             Date of Birth: 1966-05-24           MRN: [id number]             PCP: Lance Bosch, NP Referring: Martha Clan, MD Visit Date: 04/25/2020 Occupation: @GUAROCC @  Subjective:  No chief complaint on file.   History of Present Illness: Christine [name] is a 54 y.o. female ***   Activities of Daily Living:  Patient reports morning stiffness for *** {minute/hour:19697}.   Patient {ACTIONS;DENIES/REPORTS:21021675::"Denies"} nocturnal pain.  Difficulty dressing/grooming: {ACTIONS;DENIES/REPORTS:21021675::"Denies"} Difficulty climbing stairs: {ACTIONS;DENIES/REPORTS:21021675::"Denies"} Difficulty getting out of chair: {ACTIONS;DENIES/REPORTS:21021675::"Denies"} Difficulty using hands for taps, buttons, cutlery, and/or writing: {ACTIONS;DENIES/REPORTS:21021675::"Denies"}  No Rheumatology ROS completed.   PMFS History:  Patient Active Problem List   Diagnosis Date Noted  . IBS (irritable bowel syndrome) 10/18/2016  . Fibromyalgia 10/18/2016  . Fatigue 10/18/2016  . Primary insomnia 10/18/2016  . 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 Trochanteric bursitis of both hips 10/18/2016  . Primary osteoarthritis of both knees 10/18/2016  . 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 Bilateral calcaneal spurs 10/18/2016  . Vitamin D deficiency 10/18/2016  . UTI (urinary tract infection) 11/08/2014  . Allergic rhinitis 02/21/2013  . Migraine 02/21/2013    Past Medical History:  Diagnosis Date  . Allergy   . Diverticulitis   . Fibromyalgia     Family History  Problem Relation Age of Onset  . Asthma Mother   . Heart Problems Father   . Heart disease Brother   . Scoliosis Son    Past Surgical History:  Procedure Laterality Date  . ABDOMINAL HYSTERECTOMY    . CESAREAN SECTION    . FINGER SURGERY     left ring finger x 2  . FRACTURE SURGERY    . TUBAL LIGATION     Social History   Social  History Narrative  . Not on file   Immunization History  Administered Date(s) Administered  . Tdap 06/28/2018     Objective: Vital Signs: LMP 04/02/2006    Physical Exam   Musculoskeletal Exam: ***  CDAI Exam: CDAI Score: -- Patient Global: --; Provider Global: -- Swollen: --; Tender: -- Joint Exam 04/25/2020   No joint exam has been documented for this visit   There is currently no information documented on the homunculus. Go to the Rheumatology activity and complete the homunculus joint exam.  Investigation: No additional findings.  Imaging: No results found.  Recent Labs: Lab Results  Component Value Date   WBC 6.5 02/15/2020   HGB 15.9 (H) 02/15/2020   PLT 322 02/15/2020   NA 142 02/15/2020   K 3.5 02/15/2020   CL 105 02/15/2020   CO2 26 02/15/2020   GLUCOSE 117 (H) 02/15/2020   BUN 15 02/15/2020   CREATININE 0.84 02/15/2020   CALCIUM 9.8 02/15/2020   GFRAA >60 02/15/2020    Speciality Comments: No specialty comments available.  Procedures:  No procedures performed Allergies: Other, Penicillins, and Sulfa antibiotics   Assessment / Plan:     Visit Diagnoses: Fibromyalgia  Trochanteric bursitis of left hip  Primary osteoarthritis of both knees  DDD (degenerative disc disease), lumbar  Bilateral calcaneal spurs  Primary insomnia  Other fatigue  Vitamin D deficiency  History of IBS  History of migraine  Orders: No orders of the defined types were placed in this encounter.  No orders of the defined types were placed  in this encounter.   Face-to-face time spent with patient was *** minutes. Greater than 50% of time was spent in counseling and coordination of care.  Follow-Up Instructions: No follow-ups on file.   Ofilia Neas, PA-C  Note - This record has been created using Dragon software.  Chart creation errors have been sought, but may not always  have been located. Such creation errors do not reflect on  the standard of  medical care.

## 2020-04-25 ENCOUNTER — Ambulatory Visit: Payer: 59 | Admitting: Rheumatology

## 2020-04-25 DIAGNOSIS — Z8719 Personal history of other diseases of the digestive system: Secondary | ICD-10-CM

## 2020-04-25 DIAGNOSIS — R5383 Other fatigue: Secondary | ICD-10-CM

## 2020-04-25 DIAGNOSIS — M797 Fibromyalgia: Secondary | ICD-10-CM

## 2020-04-25 DIAGNOSIS — M7731 Calcaneal spur, right foot: Secondary | ICD-10-CM

## 2020-04-25 DIAGNOSIS — M170 Bilateral primary osteoarthritis of knee: Secondary | ICD-10-CM

## 2020-04-25 DIAGNOSIS — F5101 Primary insomnia: Secondary | ICD-10-CM

## 2020-04-25 DIAGNOSIS — M7062 Trochanteric bursitis, left hip: Secondary | ICD-10-CM

## 2020-04-25 DIAGNOSIS — Z8669 Personal history of other diseases of the nervous system and sense organs: Secondary | ICD-10-CM

## 2020-04-25 DIAGNOSIS — M5136 Other intervertebral disc degeneration, lumbar region: Secondary | ICD-10-CM

## 2020-04-25 DIAGNOSIS — E559 Vitamin D deficiency, unspecified: Secondary | ICD-10-CM

## 2020-05-17 NOTE — Progress Notes (Signed)
Office Visit Note  Patient: Christine [name]             Date of Birth: 1966/03/05           MRN: [id number]             PCP: Ferd Hibbs, NP Referring: Marton Redwood, MD Visit Date: 05/30/2020 Occupation: @GUAROCC @  Subjective:  Trochanteric bursitis bilaterally   History of Present Illness: Christine [name] is a 54 y.o. female with history of osteoarthritis, DDD, and fibromyalgia.  Patient reports that she has been under tremendous amount of stress since March 2021.  She states that she was diagnosed with POTS, fractured her right fifth toe, and has been grieving the loss of her brother to [place]. She states that her fibromyalgia has been flaring more frequently due to being under increased stress.  She states that she has recently started on hydroxyzine to help manage her anxiety as well.  She continues to have trochanter bursitis bilaterally and experiences nocturnal pain while lying on her sides at night.  She continues to have chronic lower back pain but denies any symptoms of radiculopathy.  She states that her knee joint pain has improved bilaterally.  She states that she recently got a recumbent bike and is trying to work up to 30 min a day for 5 days a week.   Activities of Daily Living:  Patient reports morning stiffness for 10-20 minutes.   Patient Reports nocturnal pain.  Difficulty dressing/grooming: Denies Difficulty climbing stairs: Reports Difficulty getting out of chair: Reports Difficulty using hands for taps, buttons, cutlery, and/or writing: Denies  Review of Systems  Constitutional: Positive for fatigue.  HENT: Negative for mouth sores, mouth dryness and nose dryness.   Eyes: Positive for dryness. Negative for pain and visual disturbance.  Respiratory: Negative for cough, hemoptysis, shortness of breath and difficulty breathing.   Cardiovascular: Negative for chest pain, palpitations, hypertension and swelling in legs/feet.  Gastrointestinal: Negative  for blood in stool, constipation and diarrhea.  Endocrine: Negative for increased urination.  Genitourinary: Negative for painful urination.  Musculoskeletal: Positive for arthralgias, joint pain, myalgias, morning stiffness, muscle tenderness and myalgias. Negative for joint swelling and muscle weakness.  Skin: Negative for color change, pallor, rash, hair loss, nodules/bumps, skin tightness, ulcers and sensitivity to sunlight.  Allergic/Immunologic: Negative for susceptible to infections.  Neurological: Negative for dizziness, numbness, headaches and weakness.  Hematological: Negative for swollen glands.  Psychiatric/Behavioral: Negative for depressed mood and sleep disturbance. The patient is not nervous/anxious.     PMFS History:  Patient Active Problem List   Diagnosis Date Noted  . IBS (irritable bowel syndrome) 10/18/2016  . Fibromyalgia 10/18/2016  . Fatigue 10/18/2016  . Primary insomnia 10/18/2016  . 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 Trochanteric bursitis of both hips 10/18/2016  . Primary osteoarthritis of both knees 10/18/2016  . 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 Bilateral calcaneal spurs 10/18/2016  . Vitamin D deficiency 10/18/2016  . UTI (urinary tract infection) 11/08/2014  . Allergic rhinitis 02/21/2013  . Migraine 02/21/2013    Past Medical History:  Diagnosis Date  . Allergy   . Diverticulitis   . Fibromyalgia   . POTS (postural orthostatic tachycardia syndrome)    per patient    Family History  Problem Relation Age of Onset  . Asthma Mother   . Heart Problems Father   . Heart disease Brother   . Scoliosis Son    Past Surgical History:  Procedure Laterality  Date  . ABDOMINAL HYSTERECTOMY    . CESAREAN SECTION    . FINGER SURGERY     left ring finger x 2  . FRACTURE SURGERY    . TUBAL LIGATION     Social History   Social History Narrative  . Not on file   Immunization History  Administered Date(s)  Administered  . Tdap 06/28/2018     Objective: Vital Signs: BP (!) 178/92 (BP Location: Left Wrist, Patient Position: Sitting, Cuff Size: Normal)   Pulse 71   Resp 16   Ht 5\' 3"  (1.6 m)   Wt 262 lb 9.6 oz (119.1 kg)   LMP 04/02/2006   BMI 46.52 kg/m    Physical Exam Vitals and nursing note reviewed.  Constitutional:      Appearance: She is well-developed.  HENT:     Head: Normocephalic and atraumatic.  Eyes:     Conjunctiva/sclera: Conjunctivae normal.  Pulmonary:     Effort: Pulmonary effort is normal.  Abdominal:     General: Bowel sounds are normal.     Palpations: Abdomen is soft.  Musculoskeletal:     Cervical back: Normal range of motion.  Lymphadenopathy:     Cervical: No cervical adenopathy.  Skin:    General: Skin is warm and dry.     Capillary Refill: Capillary refill takes less than 2 seconds.  Neurological:     Mental Status: She is alert and oriented to person, place, and time.  Psychiatric:        Behavior: Behavior normal.      Musculoskeletal Exam: C-spine, thoracic spine, and lumbar spine good ROM.  Shoulder joints, elbow joints, wrist joints, MCPs, PIPs, and DIPs good ROM with no synovitis.  Complete fist formation bilaterally.  Hip joints, knee joints, and ankle joints good ROM with no discomfort.  No warmth or effusion of knee joints.  No tenderness or swelling of ankle joints.   CDAI Exam: CDAI Score: -- Patient Global: --; Provider Global: -- Swollen: --; Tender: -- Joint Exam 05/30/2020   No joint exam has been documented for this visit   There is currently no information documented on the homunculus. Go to the Rheumatology activity and complete the homunculus joint exam.  Investigation: No additional findings.  Imaging: No results found.  Recent Labs: Lab Results  Component Value Date   WBC 6.5 02/15/2020   HGB 15.9 (H) 02/15/2020   PLT 322 02/15/2020   NA 142 02/15/2020   K 3.5 02/15/2020   CL 105 02/15/2020   CO2 26  02/15/2020   GLUCOSE 117 (H) 02/15/2020   BUN 15 02/15/2020   CREATININE 0.84 02/15/2020   CALCIUM 9.8 02/15/2020   GFRAA >60 02/15/2020    Speciality Comments: No specialty comments available.  Procedures:  No procedures performed Allergies: Other, Penicillins, and Sulfa antibiotics   Assessment / Plan:     Visit Diagnoses: Primary osteoarthritis of both knees: She has good range of motion of both knee joints on exam.  No warmth or effusion was noted.  She has no difficulty rising from a seated position or climbing steps due to discomfort in her knee joints.  Discussed importance of lower extremity strengthening.  She recently purchased a recumbent bike and is trying to work up to 30 minutes daily for 5 days a week.  Trochanteric bursitis of both hips: She continues to have discomfort due to trochanter bursitis bilaterally.  She has tenderness to palpation on examination today.  She was encouraged to perform  stretching exercises on a daily basis.  She was given a handout of these exercises to perform.  DDD (degenerative disc disease), lumbar: She is been experiencing increased lower back pain recently.  She has no symptoms of radiculopathy.  She has been experiencing frequent muscle spasms.  She continues to take magnesium.  Back exercises were discussed.  Refills of Robaxin and zanaflex will be sent to the pharmacy today.  Fibromyalgia: She has generalized hyperalgesia and positive tender points on exam.  She has been experiencing increased myalgias and muscle tenderness due to fibromyalgia.  She is been having more frequent muscle spasms.  She has been under tremendous amount of stress the last several months which has exacerbated her fibromyalgia.  She is also been experiencing increased fatigue secondary to insomnia.  She is only been sleeping about 4 to 5 hours per night.  She takes Robaxin and Zanaflex very sparingly for muscle spasms.  She continues to take Ambien 10 mg half tablet by  mouth at bedtime as needed to help her sleep.  We discussed the importance of regular exercise and good sleep hygiene  Closed fracture of phalanx of right fifth toe with routine healing, subsequent encounter: She presented to urgent care on 02/24/2020 after she injured her right fifth toe.  According to the patient she was stretching and had her legs extended and her dog ran and tried to jump on the bed and hit her right foot.  According to the patient she was found to have a fracture and was evaluated [facility] the following day.  She had to wear a darby shoe for several weeks.  She has no transitioned to crocs.  Her discomfort has been tolerable with tylenol and advil.    Bilateral calcaneal spurs: She is not having any discomfort at this time.   Primary insomnia: She has difficulty sleeping at night.  She has been experiencing increased nocturnal pain.  She takes Ambien 10 mg half tablet by mouth at bedtime to help her sleep.  A refill of Ambien was sent to the pharmacy today.  Other fatigue: She has chronic fatigue secondary to insomnia.  We discussed the importance of regular exercise.  She has been using her recumbent bike 30 minutes 3 times a week and is trying to work up to 5 days weekly.  POTS (postural orthostatic tachycardia syndrome): In February 2021 patient presented to [facility] urgent care with palpitations and lightheadedness.  She had a EKG with evidence of increased HR with postural changes.  She followed up with Dr. Bjorn Pippin who completed a thorough workup. She had no evidence of arrhythmias on Zio patch and no significant abnormalities on echocardiogram.  There was a high clinical suspicion for POTS and conservative measures were discussed with the patient at her most recent visit on 05/22/2020.  She was encouraged to avoid dehydration, wear compression stockings, make slow positional changes and exercising using a recumbent bicycle.  Vitamin D deficiency: She continues to take a  vitamin D supplement.   Other medical conditions are listed as follows:   History of IBS  History of migraine  Orders: No orders of the defined types were placed in this encounter.  No orders of the defined types were placed in this encounter.   Face-to-face time spent with patient was 30 minutes. Greater than 50% of time was spent in counseling and coordination of care.  Follow-Up Instructions: Return in about 6 months (around 11/29/2020) for Fibromyalgia, Osteoarthritis.   Gearldine Bienenstock, PA-C  Note - This record has been created using Dragon software.  Chart creation errors have been sought, but may not always  have been located. Such creation errors do not reflect on  the standard of medical care.  

## 2020-05-21 NOTE — Progress Notes (Signed)
Virtual Visit via Telephone Note   This visit type was conducted due to national recommendations for restrictions regarding the COVID-19 Pandemic (e.g. social distancing) in an effort to limit this patient's exposure and mitigate transmission in our community.  Due to her co-morbid illnesses, this patient is at least at moderate risk for complications without adequate follow up.  This format is felt to be most appropriate for this patient at this time.  The patient did not have access to video technology/had technical difficulties with video requiring transitioning to audio format only (telephone).  All issues noted in this document were discussed and addressed.  No physical exam could be performed with this format.  Please refer to the patient's chart for her  consent to telehealth for [facility].   Date:  05/22/2020   ID:  Christine [name], DOB 1966/10/09, MRN [id number]  Patient Location: Home Provider Location: Office  PCP:  Lance Bosch, NP  Cardiologist:  No primary care provider on file.  Electrophysiologist:  None   Evaluation Performed:  Follow-Up Visit  Chief Complaint: Tachycardia  History of Present Illness:    Christine [name] is a 54 y.o. female with a hx of fibromyalgia, IBS who presents for follow-up.    She was initially seen on 02/23/2020.  She had an ED admission on 02/15/2020 with chest pain and palpitations.  States that for the prior 2 weeks she has been having episodes of tachycardia.  Heart rate has been as high as 200s.  Typically occurs when standing, as has noticed rapid increase in heart rate.  She feels lightheaded during these episodes.  Has also noticed tachycardia when resting.  She feels like her heart is racing.  She does not exercise.  Denies any chest pain or dyspnea.  Given her symptoms, she went to the ED on 02/15/20.  Orthostatics in the ED showed increase in heart rate from 83-115 with standing, no hypotension.  She was given 2 L of IV fluid and  orthostatics improved.  Labs unremarkable, Troponins negative x2.  EKG unremarkable.  D-dimer negative.  Reports that since the ED visit, she continued to have symptoms of tachycardia and palpitations.  Labs drawn at PCP visit 02/22/20 showed normal creatinine (0.9), Hemoglobin (15.9), potassium (4.6), LDL 117, A1c 5.5, TSH 3.27, magnesium 2.2.  Drinks 1 cup of coffee daily.  Rare alcohol use.  Uses THC gummy, has stopped to see if symptoms improved, but did not notice any difference.  Father died of MI in 46s.  Brother had MI in 20s.  Smoked in teens, quit years ago.    TTE on 03/13/2020 showed normal LV systolic function, normal RV function, mild mitral regurgitation.  Zio patch x4 days showed no significant arrhythmias.  Since last clinic visit, she reports that she continues to have occasional palpitations where she feels like her heart is racing.  Particularly occurs when she is walking.  She denies any lightheadedness or syncope.  She has been using a recumbent bike for exercise, is up to 20 minutes/day.   Past Medical History:  Diagnosis Date  . Allergy   . Diverticulitis   . Fibromyalgia    Past Surgical History:  Procedure Laterality Date  . ABDOMINAL HYSTERECTOMY    . CESAREAN SECTION    . FINGER SURGERY     left ring finger x 2  . FRACTURE SURGERY    . TUBAL LIGATION       Current Meds  Medication Sig  . cholecalciferol (VITAMIN  D) 1000 UNITS tablet Take 10,000 Units by mouth at bedtime.   . fish oil-omega-3 fatty acids 1000 MG capsule Take 1 g by mouth 2 (two) times daily.   . hydrOXYzine (VISTARIL) 25 MG capsule Take 25 mg by mouth as needed for anxiety.  . hyoscyamine (LEVSIN SL) 0.125 MG SL tablet Place 0.125 mg under the tongue every 4 (four) hours as needed for cramping.   Marland Kitchen MAGNESIUM PO Take 1 tablet by mouth daily.  . methocarbamol (ROBAXIN) 500 MG tablet Take 1 tablet (500 mg total) by mouth 2 (two) times daily with breakfast and lunch. (Patient taking differently: Take  500 mg by mouth as needed for muscle spasms (pain). )  . Multiple Vitamins-Minerals (MULTIVITAMIN WOMEN PO) Take 1 tablet by mouth daily.  . Probiotic Product (PROBIOTIC-10 PO) Take 1 tablet by mouth daily.   Marland Kitchen tiZANidine (ZANAFLEX) 4 MG capsule Take 1 capsule (4 mg total) by mouth at bedtime.  Marland Kitchen zolpidem (AMBIEN) 10 MG tablet Take 1 tablet (10 mg total) by mouth at bedtime as needed for sleep. (Patient taking differently: Take 5 mg by mouth at bedtime as needed for sleep. )  . [DISCONTINUED] RESTASIS 0.05 % ophthalmic emulsion Place 1 drop into both eyes 2 (two) times daily.   . [DISCONTINUED] TURMERIC PO Take 900 mg by mouth daily.     Allergies:   Other, Penicillins, and Sulfa antibiotics   Social History   Tobacco Use  . Smoking status: Former Smoker    Packs/day: 0.25    Years: 2.00    Pack years: 0.50    Types: Cigarettes    Quit date: 02/22/1988    Years since quitting: 32.2  . Smokeless tobacco: Never Used  Substance Use Topics  . Alcohol use: No  . Drug use: No     Family Hx: The patient's family history includes Asthma in her mother; Heart Problems in her father; Heart disease in her brother; Scoliosis in her son.  ROS:   Please see the history of present illness.     All other systems reviewed and are negative.   Prior CV studies:   The following studies were reviewed today:    Labs/Other Tests and Data Reviewed:    EKG:  No ECG reviewed.  Recent Labs: 02/15/2020: BUN 15; Creatinine, Ser 0.84; Hemoglobin 15.9; Platelets 322; Potassium 3.5; Sodium 142   Recent Lipid Panel No results found for: CHOL, TRIG, HDL, CHOLHDL, LDLCALC, LDLDIRECT  Wt Readings from Last 3 Encounters:  05/22/20 243 lb (110.2 kg)  02/23/20 245 lb (111.1 kg)  02/13/20 250 lb (113.4 kg)     Objective:    Vital Signs:  BP 138/85   Pulse 88   Ht 5\' 3"  (1.6 m)   Wt 243 lb (110.2 kg)   LMP 04/02/2006   BMI 43.05 kg/m    VITAL SIGNS:  reviewed  Gen: No respiratory distress, able  to converse comfortably  ASSESSMENT & PLAN:    Palpitations/lightheadeness: Given significant increase in heart rate with standing, with no significant change in blood pressure, concerning for POTS.    No evidence of arrhythmias on Zio patch and no significant abnormalities on echocardiogram. -Discussed conservative measures for treatment of POTS.  Recommend avoiding dehydration.  If tolerated, compression stocking can assist with fluid management and prevent pooling in the legs. Slow position changes are recommended.  If there is a feeling of severe lightheadedness, like near to passing out, recommend lying on the floor on the back, with  legs elevated up on a chair or up against the wall. -the best long term management of POTS symptoms is gradual exercise conditioning.  She has been exercising using recumbent bicycle, recommended goal of 30 minutes/day  RTC in 6 months  Time:   Today, I have spent 12 minutes with the patient with telehealth technology discussing the above problems.     Medication Adjustments/Labs and Tests Ordered: Current medicines are reviewed at length with the patient today.  Concerns regarding medicines are outlined above.   Tests Ordered: No orders of the defined types were placed in this encounter.   Medication Changes: No orders of the defined types were placed in this encounter.   Follow Up:  In Person in 6 month(s)  Signed, Donato Heinz, MD  05/22/2020 8:55 AM    [place]

## 2020-05-22 ENCOUNTER — Encounter: Payer: Self-pay | Admitting: Cardiology

## 2020-05-22 ENCOUNTER — Ambulatory Visit: Payer: No Typology Code available for payment source | Admitting: Cardiology

## 2020-05-22 ENCOUNTER — Telehealth (INDEPENDENT_AMBULATORY_CARE_PROVIDER_SITE_OTHER): Payer: No Typology Code available for payment source | Admitting: Cardiology

## 2020-05-22 VITALS — BP 138/85 | HR 88 | Ht 63.0 in | Wt 243.0 lb

## 2020-05-22 DIAGNOSIS — R42 Dizziness and giddiness: Secondary | ICD-10-CM | POA: Diagnosis not present

## 2020-05-22 DIAGNOSIS — R002 Palpitations: Secondary | ICD-10-CM | POA: Diagnosis not present

## 2020-05-22 NOTE — Patient Instructions (Signed)

## 2020-05-30 ENCOUNTER — Other Ambulatory Visit: Payer: Self-pay

## 2020-05-30 ENCOUNTER — Encounter: Payer: Self-pay | Admitting: Physician Assistant

## 2020-05-30 ENCOUNTER — Telehealth: Payer: Self-pay | Admitting: *Deleted

## 2020-05-30 ENCOUNTER — Ambulatory Visit: Payer: No Typology Code available for payment source | Admitting: Physician Assistant

## 2020-05-30 VITALS — BP 178/92 | HR 71 | Resp 16 | Ht 63.0 in | Wt 262.6 lb

## 2020-05-30 DIAGNOSIS — M5136 Other intervertebral disc degeneration, lumbar region: Secondary | ICD-10-CM

## 2020-05-30 DIAGNOSIS — G90A Postural orthostatic tachycardia syndrome (POTS): Secondary | ICD-10-CM

## 2020-05-30 DIAGNOSIS — M7061 Trochanteric bursitis, right hip: Secondary | ICD-10-CM | POA: Diagnosis not present

## 2020-05-30 DIAGNOSIS — I498 Other specified cardiac arrhythmias: Secondary | ICD-10-CM

## 2020-05-30 DIAGNOSIS — M170 Bilateral primary osteoarthritis of knee: Secondary | ICD-10-CM

## 2020-05-30 DIAGNOSIS — M7732 Calcaneal spur, left foot: Secondary | ICD-10-CM

## 2020-05-30 DIAGNOSIS — E559 Vitamin D deficiency, unspecified: Secondary | ICD-10-CM

## 2020-05-30 DIAGNOSIS — M797 Fibromyalgia: Secondary | ICD-10-CM | POA: Diagnosis not present

## 2020-05-30 DIAGNOSIS — R5383 Other fatigue: Secondary | ICD-10-CM

## 2020-05-30 DIAGNOSIS — M7731 Calcaneal spur, right foot: Secondary | ICD-10-CM

## 2020-05-30 DIAGNOSIS — F5101 Primary insomnia: Secondary | ICD-10-CM

## 2020-05-30 DIAGNOSIS — M51369 Other intervertebral disc degeneration, lumbar region without mention of lumbar back pain or lower extremity pain: Secondary | ICD-10-CM

## 2020-05-30 DIAGNOSIS — S92501D Displaced unspecified fracture of right lesser toe(s), subsequent encounter for fracture with routine healing: Secondary | ICD-10-CM

## 2020-05-30 DIAGNOSIS — Z8719 Personal history of other diseases of the digestive system: Secondary | ICD-10-CM

## 2020-05-30 DIAGNOSIS — M7062 Trochanteric bursitis, left hip: Secondary | ICD-10-CM

## 2020-05-30 DIAGNOSIS — Z8669 Personal history of other diseases of the nervous system and sense organs: Secondary | ICD-10-CM

## 2020-05-30 MED ORDER — METHOCARBAMOL 500 MG PO TABS: 500.0000 mg | ORAL_TABLET | Freq: Two times a day (BID) | ORAL | 2 refills | Status: DC | PRN

## 2020-05-30 MED ORDER — TIZANIDINE HCL 4 MG PO CAPS: 4.0000 mg | ORAL_CAPSULE | Freq: Every evening | ORAL | 0 refills | Status: DC | PRN

## 2020-05-30 MED ORDER — ZOLPIDEM TARTRATE 10 MG PO TABS: ORAL_TABLET | ORAL | 2 refills | Status: DC

## 2020-05-30 NOTE — Addendum Note (Signed)
Addended by: Gearldine Bienenstock on: 05/30/2020 12:11 PM   Modules accepted: Orders

## 2020-05-30 NOTE — Patient Instructions (Signed)
Hip Bursitis Rehab Ask your health care provider which exercises are safe for you. Do exercises exactly as told by your health care provider and adjust them as directed. It is normal to feel mild stretching, pulling, tightness, or discomfort as you do these exercises. Stop right away if you feel sudden pain or your pain gets worse. Do not begin these exercises until told by your health care provider. Stretching exercise This exercise warms up your muscles and joints and improves the movement and flexibility of your hip. This exercise also helps to relieve pain and stiffness. Iliotibial band stretch An iliotibial band is a strong band of muscle tissue that runs from the outer side of your hip to the outer side of your thigh and knee. 1. Lie on your side with your left / right leg in the top position. 2. Bend your left / right knee and grab your ankle. Stretch out your bottom arm to help you balance. 3. Slowly bring your knee back so your thigh is behind your body. 4. Slowly lower your knee toward the floor until you feel a gentle stretch on the outside of your left / right thigh. If you do not feel a stretch and your knee will not fall farther, place the heel of your other foot on top of your knee and pull your knee down toward the floor with your foot. 5. Hold this position for __________ seconds. 6. Slowly return to the starting position. Repeat __________ times. Complete this exercise __________ times a day. Strengthening exercises These exercises build strength and endurance in your hip and pelvis. Endurance is the ability to use your muscles for a long time, even after they get tired. Bridge This exercise strengthens the muscles that move your thigh backward (hip extensors). 1. Lie on your back on a firm surface with your knees bent and your feet flat on the floor. 2. Tighten your buttocks muscles and lift your buttocks off the floor until your trunk is level with your thighs. ? Do not arch  your back. ? You should feel the muscles working in your buttocks and the back of your thighs. If you do not feel these muscles, slide your feet 1-2 inches (2.5-5 cm) farther away from your buttocks. ? If this exercise is too easy, try doing it with your arms crossed over your chest. 3. Hold this position for __________ seconds. 4. Slowly lower your hips to the starting position. 5. Let your muscles relax completely after each repetition. Repeat __________ times. Complete this exercise __________ times a day. Squats This exercise strengthens the muscles in front of your thigh and knee (quadriceps). 1. Stand in front of a table, with your feet and knees pointing straight ahead. You may rest your hands on the table for balance but not for support. 2. Slowly bend your knees and lower your hips like you are going to sit in a chair. ? Keep your weight over your heels, not over your toes. ? Keep your lower legs upright so they are parallel with the table legs. ? Do not let your hips go lower than your knees. ? Do not bend lower than told by your health care provider. ? If your hip pain increases, do not bend as low. 3. Hold the squat position for __________ seconds. 4. Slowly push with your legs to return to standing. Do not use your hands to pull yourself to standing. Repeat __________ times. Complete this exercise __________ times a day. Hip hike 1. Stand   sideways on a bottom step. Stand on your left / right leg with your other foot unsupported next to the step. You can hold on to the railing or wall for balance if needed. 2. Keep your knees straight and your torso square. Then lift your left / right hip up toward the ceiling. 3. Hold this position for __________ seconds. 4. Slowly let your left / right hip lower toward the floor, past the starting position. Your foot should get closer to the floor. Do not lean or bend your knees. Repeat __________ times. Complete this exercise __________ times a  day. Single leg stand 1. Without shoes, stand near a railing or in a doorway. You may hold on to the railing or door frame as needed for balance. 2. Squeeze your left / right buttock muscles, then lift up your other foot. ? Do not let your left / right hip push out to the side. ? It is helpful to stand in front of a mirror for this exercise so you can watch your hip. 3. Hold this position for __________ seconds. Repeat __________ times. Complete this exercise __________ times a day. This information is not intended to replace advice given to you by your health care provider. Make sure you discuss any questions you have with your health care provider. Document Revised: 03/29/2019 Document Reviewed: 03/29/2019 Elsevier Patient Education  2020 [employer]  

## 2020-05-30 NOTE — Telephone Encounter (Signed)
A message was left, re: her follow up visit. 

## 2020-09-04 ENCOUNTER — Other Ambulatory Visit: Payer: Self-pay | Admitting: Physician Assistant

## 2020-09-04 NOTE — Telephone Encounter (Signed)
Last Visit: 05/30/2020 Next Visit: 11/28/2020  Last Fill: 05/30/2020  Okay to refill Tizanidine?

## 2020-11-14 NOTE — Progress Notes (Signed)
Office Visit Note  Patient: Christine [name]             Date of Birth: Jul 03, 1966           MRN: [id number]             PCP: Lance Bosch, NP Referring: Lance Bosch, NP Visit Date: 11/28/2020 Occupation: @GUAROCC @  Subjective:  Right knee joint pain   History of Present Illness: Christine [name] is a 54 y.o. female with history of fibromyalgia, osteoarthritis, and DDD. She reports she fell in October and landed on her right knee.  She had x-rays at her orthopedist office which did not reveal any acute findings.  She was told that she had a bad contusion in the right knee.  She states she was having difficulty bearing full weight on her right leg. She initially had to use crutches and transitioned to a cane and is now walking without assistance.  She continues to have numbness on the lateral aspect of her right knee.  She has ongoing discomfort due to trochanter bursitis of both hips.  She has been trying to ride a recumbent bike on a regular basis for exercise.  She continues to have trapezius muscle tension and muscle tenderness bilaterally.  She tries to perform neck exercises on a regular basis.  She takes methocarbamol 500 mg twice daily as needed and Zanaflex 4 mg by mouth as needed at bedtime for muscle spasms.  He tries to take these medications sparingly.  She takes Ambien 10 mg half tablet by mouth at bedtime as needed for insomnia.  She has been taking melatonin more often than having to take Ambien recently.   Activities of Daily Living:  Patient reports morning stiffness for  5-10 minutes.   Patient Reports nocturnal pain.  Difficulty dressing/grooming: Denies Difficulty climbing stairs: Denies Difficulty getting out of chair: Denies Difficulty using hands for taps, buttons, cutlery, and/or writing: Denies  Review of Systems  Constitutional: Positive for fatigue.  HENT: Positive for mouth dryness. Negative for mouth sores and nose dryness.   Eyes: Positive for  dryness. Negative for pain, itching and visual disturbance.  Respiratory: Positive for shortness of breath. Negative for cough, hemoptysis and difficulty breathing.   Cardiovascular: Negative for chest pain, palpitations, hypertension and swelling in legs/feet.  Gastrointestinal: Negative for blood in stool, constipation and diarrhea.  Endocrine: Negative for increased urination.  Genitourinary: Negative for difficulty urinating and painful urination.  Musculoskeletal: Positive for arthralgias, joint pain, myalgias, morning stiffness, muscle tenderness and myalgias. Negative for joint swelling and muscle weakness.  Skin: Negative for color change, pallor, rash, hair loss, nodules/bumps, redness, skin tightness, ulcers and sensitivity to sunlight.  Allergic/Immunologic: Negative for susceptible to infections.  Neurological: Positive for numbness and headaches. Negative for dizziness, memory loss and weakness.  Hematological: Positive for bruising/bleeding tendency. Negative for swollen glands.  Psychiatric/Behavioral: Negative for depressed mood, confusion and sleep disturbance. The patient is nervous/anxious.     PMFS History:  Patient Active Problem List   Diagnosis Date Noted  . IBS (irritable bowel syndrome) 10/18/2016  . Fibromyalgia 10/18/2016  . Fatigue 10/18/2016  . Primary insomnia 10/18/2016  . 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 Trochanteric bursitis of both hips 10/18/2016  . Primary osteoarthritis of both knees 10/18/2016  . 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 Bilateral calcaneal spurs 10/18/2016  . Vitamin D deficiency 10/18/2016  . UTI (urinary tract infection) 11/08/2014  . Allergic rhinitis 02/21/2013  . Migraine 02/21/2013  Past Medical History:  Diagnosis Date  . Allergy   . Diverticulitis   . Fibromyalgia   . POTS (postural orthostatic tachycardia syndrome)    per patient    Family History  Problem Relation Age of Onset   . Asthma Mother   . Heart Problems Father   . Heart disease Brother   . Scoliosis Son    Past Surgical History:  Procedure Laterality Date  . ABDOMINAL HYSTERECTOMY    . CESAREAN SECTION    . FINGER SURGERY     left ring finger x 2  . FRACTURE SURGERY    . TUBAL LIGATION     Social History   Social History Narrative  . Not on file   Immunization History  Administered Date(s) Administered  . PFIZER SARS-COV-2 Vaccination 03/12/2020, 04/02/2020  . Tdap 06/28/2018     Objective: Vital Signs: BP (!) 153/82 (BP Location: Right Arm, Patient Position: Sitting, Cuff Size: Large)   Pulse 80   Resp 16   Ht 5\' 3"  (1.6 m)   Wt 281 lb (127.5 kg)   LMP 04/02/2006   BMI 49.78 kg/m    Physical Exam Vitals and nursing note reviewed.  Constitutional:      Appearance: She is well-developed and well-nourished.  HENT:     Head: Normocephalic and atraumatic.  Eyes:     Extraocular Movements: EOM normal.     Conjunctiva/sclera: Conjunctivae normal.  Cardiovascular:     Pulses: Intact distal pulses.  Pulmonary:     Effort: Pulmonary effort is normal.  Abdominal:     Palpations: Abdomen is soft.  Musculoskeletal:     Cervical back: Normal range of motion.  Skin:    General: Skin is warm and dry.     Capillary Refill: Capillary refill takes less than 2 seconds.  Neurological:     Mental Status: She is alert and oriented to person, place, and time.  Psychiatric:        Mood and Affect: Mood and affect normal.        Behavior: Behavior normal.      Musculoskeletal Exam:  C-spine, thoracic spine, and lumbar spine good ROM. Trapezius muscle tension and tenderness bilaterally.   Shoulder joints good ROM with no discomfort.  Tenderness over the left subacromial bursa.  Elbow joints, wrist joints, MCPs, PIPs, and DIPs good ROM with no synovitis. Complete fist formation bilaterally.  Hip joints have good range of motion with no discomfort.  Knee joints have good range of motion with no  warmth or effusion.  Ankle joints have good range of motion with no tenderness or inflammation.  Pedal edema noted bilaterally.  Tenderness over bilateral trochanteric bursa.   CDAI Exam: CDAI Score: -- Patient Global: --; Provider Global: -- Swollen: --; Tender: -- Joint Exam 11/28/2020   No joint exam has been documented for this visit   There is currently no information documented on the homunculus. Go to the Rheumatology activity and complete the homunculus joint exam.  Investigation: No additional findings.  Imaging: No results found.  Recent Labs: Lab Results  Component Value Date   WBC 6.5 02/15/2020   HGB 15.9 (H) 02/15/2020   PLT 322 02/15/2020   NA 142 02/15/2020   K 3.5 02/15/2020   CL 105 02/15/2020   CO2 26 02/15/2020   GLUCOSE 117 (H) 02/15/2020   BUN 15 02/15/2020   CREATININE 0.84 02/15/2020   CALCIUM 9.8 02/15/2020   GFRAA >60 02/15/2020    Speciality Comments: No  specialty comments available.  Procedures:  No procedures performed Allergies: Other, Penicillins, and Sulfa antibiotics   Assessment / Plan:     Visit Diagnoses: Primary osteoarthritis of both knees: She had a fall in October 2021 and landed on her right knee joint.  She was evaluated at Dr. Jeannetta EllisGioffre's office and had updated x-rays which did not reveal any acute findings.  She was told that she had a contusion and was encouraged to alternate applying ice and heat.  She initially had to use crutches and was transitioned then to a cane.  She is walking without assistance today.  She continues to have diminished sensation on the lateral aspect of the right knee.  She has good range of motion of the right knee joint on examination with no warmth or effusion.  Left knee has good range of motion with no warmth or effusion.  We discussed the importance of lower extremity muscle strengthening and fall prevention.  She has been performing the exercises on a regular basis and has started to ride her  recumbent bike at least 15 minutes several days a week.  She was advised to notify us if her right knee joint pain persists or worsens.  She will follow-up in the office in 6 months.  Trochanteric bursitis of both hips: She has tenderness to palpation over bilateral trochanteric bursa.  She was encouraged to perform stretching exercises on a daily basis.  DDD (degenerative disc disease), lumbar: She is not experiencing any increased discomfort in her lower back at this time.  She has no symptoms of radiculopathy.  Fibromyalgia -she has generalized hyperalgesia and positive tender points on exam.  She is trapezius muscle tension and muscle tenderness bilaterally.  She takes methocarbamol 500 mg twice daily as needed and tizanidine 4 mg a mouth at bedtime as needed for muscle spasms.  She takes both medications very sparingly.  She continues to have chronic fatigue secondary to insomnia.  She takes either melatonin or Ambien 10 mg half tablet by mouth at bedtime as needed for insomnia.  We discussed the importance of regular exercise and good sleep hygiene.  She has started to ride her recumbent bike several days a week for at least 15 minutes.  She plans on returning to needed energy 1-2 times per month for myofascial release and massages.  We discussed a referral to integrative therapies and she will notify us once she has more time for the sessions.  Closed fracture of phalanx of right fifth toe with routine healing, subsequent encounter - She presented to urgent care on 02/24/2020 after she injured her right fifth toe.   Bilateral calcaneal spurs: She is not experiencing any discomfort in her feet at this time.  She wears proper fitting shoes.  Primary insomnia - She takes Ambien 10 mg half tablet by mouth at bedtime for insomnia.  She has been taking Ambien sparingly.  She typically takes melatonin and tizanidine 4 mg by mouth at bedtime as needed for insomnia.  Other fatigue: Chronic and secondary  to insomnia.  Discussed the importance of regular exercise.  Other medical conditions are listed as follows:  POTS (postural orthostatic tachycardia syndrome)  History of IBS  Vitamin D deficiency  History of migraine  Orders: No orders of the defined types were placed in this encounter.  Meds ordered this encounter  Medications  . tiZANidine (ZANAFLEX) 4 MG tablet    Sig: Take 1 tablet (4 mg total) by mouth at bedtime as needed for muscle  spasms.    Dispense:  90 tablet    Refill:  0     Follow-Up Instructions: Return in about 6 months (around 05/29/2021) for Osteoarthritis, DDD, Fibromyalgia.   Gearldine Bienenstock, PA-C  Note - This record has been created using Dragon software.  Chart creation errors have been sought, but may not always  have been located. Such creation errors do not reflect on  the standard of medical care.

## 2020-11-20 ENCOUNTER — Encounter: Payer: Self-pay | Admitting: Cardiology

## 2020-11-20 ENCOUNTER — Ambulatory Visit (INDEPENDENT_AMBULATORY_CARE_PROVIDER_SITE_OTHER): Payer: Self-pay | Admitting: Cardiology

## 2020-11-20 ENCOUNTER — Other Ambulatory Visit: Payer: Self-pay

## 2020-11-20 VITALS — BP 124/76 | HR 76 | Ht 63.5 in | Wt 277.0 lb

## 2020-11-20 DIAGNOSIS — R002 Palpitations: Secondary | ICD-10-CM

## 2020-11-20 DIAGNOSIS — R42 Dizziness and giddiness: Secondary | ICD-10-CM

## 2020-11-20 DIAGNOSIS — R079 Chest pain, unspecified: Secondary | ICD-10-CM

## 2020-11-20 NOTE — Progress Notes (Signed)
Cardiology Office Note:    Date:  11/20/2020   ID:  Christine [name], DOB Nov 19, 1966, MRN [id number]  PCP:  Lance Bosch, NP  Cardiologist:  No primary care provider on file.  Electrophysiologist:  None   Referring MD: Lance Bosch, NP   Chief Complaint  Patient presents with  . Palpitations    History of Present Illness:    Christine [name] is a 54 y.o. female with a hx of fibromyalgia, IBS who presents for follow-up.   She was initially seen on 02/23/2020.  She had an ED admission on 02/15/2020 with chest pain andpalpitations.States that for the prior 2 weeks she has been having episodes of tachycardia. Heart rate has been as high as 200s. Typically occurs when standing, ashas noticed rapid increase in heart rate. She feels lightheaded during these episodes. Has also noticed tachycardia when resting. She feels like her heart is racing. She does not exercise. Denies any chest pain or dyspnea. Given her symptoms, she went to the ED on 02/15/20.Orthostatics in the ED showed increase in heart rate from 83-115 with standing, no hypotension. She was given 2 L of IV fluid and orthostatics improved. Labs unremarkable, Troponins negative x2. EKG unremarkable. D-dimer negative.  Reports that since the ED visit, she continued to have symptoms of tachycardia and palpitations. Labs drawn at PCP visit 02/22/20 showed normal creatinine (0.9), Hemoglobin (15.9), potassium (4.6), LDL 117, A1c 5.5, TSH 3.27, magnesium 2.2. Drinks 1 cup of coffee daily. Rare alcohol use. UsesTHC gummy, has stoppedto see if symptoms improved, but did not notice any difference.Father died of MI in 76s. Brother had MI in 62s. Smoked in teens, quit years ago.   TTE on 03/13/2020 showed normal LV systolic function, normal RV function, mild mitral regurgitation.  Zio patch x4 days showed no significant arrhythmias.  Since last clinic visit, she reports that she has been doing well.  States she has  only had lightheadedness once or twice per month.  Occurs with standing.  She denies any syncopal episodes.  Reports symptoms are generally worse when she is stressed.  Reports she has been staying well-hydrated.  She has been exercising by riding bicycle 3 times per week for 15 to 20 minutes.  She had a knee injury that was keeping her from exercising but is now back to using her bicycle.  Reports shortness of breath only with significant activity.  Does report that she had an episode of chest pain about 3 months ago.  States that she was very stressed and began to have dull aching pain in center of her chest that radiated to her jaw.  Lasted for about 5 to 8 minutes.  No pain since that time.    Past Medical History:  Diagnosis Date  . Allergy   . Diverticulitis   . Fibromyalgia   . POTS (postural orthostatic tachycardia syndrome)    per patient    Past Surgical History:  Procedure Laterality Date  . ABDOMINAL HYSTERECTOMY    . CESAREAN SECTION    . FINGER SURGERY     left ring finger x 2  . FRACTURE SURGERY    . TUBAL LIGATION      Current Medications: Current Meds  Medication Sig  . cholecalciferol (VITAMIN D) 1000 UNITS tablet Take 10,000 Units by mouth at bedtime.   . fish oil-omega-3 fatty acids 1000 MG capsule Take 1 g by mouth 2 (two) times daily.   . hydrOXYzine (VISTARIL) 25 MG capsule Take 25 mg by  mouth as needed for anxiety.  . hyoscyamine (LEVSIN SL) 0.125 MG SL tablet Place 0.125 mg under the tongue every 4 (four) hours as needed for cramping.   Marland Kitchen MAGNESIUM PO Take 1 tablet by mouth daily.  . methocarbamol (ROBAXIN) 500 MG tablet Take 1 tablet (500 mg total) by mouth 2 (two) times daily as needed for muscle spasms.  . Multiple Vitamins-Minerals (MULTIVITAMIN WOMEN PO) Take 1 tablet by mouth daily.  . Probiotic Product (PROBIOTIC-10 PO) Take 1 tablet by mouth daily.   Marland Kitchen tiZANidine (ZANAFLEX) 4 MG capsule TAKE 1 CAPSULE (4 MG TOTAL) BY MOUTH AT BEDTIME AS NEEDED FOR  MUSCLE SPASMS.  Marland Kitchen zolpidem (AMBIEN) 10 MG tablet Take a half tablet (5 mg total) by mouth at bedtime as needed for insomnia.     Allergies:   Other, Penicillins, and Sulfa antibiotics   Social History   Socioeconomic History  . Marital status: Married    Spouse name: Not on file  . Number of children: Not on file  . Years of education: Not on file  . Highest education level: Not on file  Occupational History  . Occupation: Agricultural engineer: [employer]  Tobacco Use  . Smoking status: Former Smoker    Packs/day: 0.25    Years: 2.00    Pack years: 0.50    Types: Cigarettes    Quit date: 02/22/1988    Years since quitting: 32.7  . Smokeless tobacco: Never Used  Vaping Use  . Vaping Use: Never used  Substance and Sexual Activity  . Alcohol use: No  . Drug use: No  . Sexual activity: Yes    Birth control/protection: Surgical  Other Topics Concern  . Not on file  Social History Narrative  . Not on file   Social Determinants of Health   Financial Resource Strain:   . Difficulty of Paying Living Expenses: Not on file  Food Insecurity:   . Worried About Programme researcher, broadcasting/film/video in the Last Year: Not on file  . Ran Out of Food in the Last Year: Not on file  Transportation Needs:   . Lack of Transportation (Medical): Not on file  . Lack of Transportation (Non-Medical): Not on file  Physical Activity:   . Days of Exercise per Week: Not on file  . Minutes of Exercise per Session: Not on file  Stress:   . Feeling of Stress : Not on file  Social Connections:   . Frequency of Communication with Friends and Family: Not on file  . Frequency of Social Gatherings with Friends and Family: Not on file  . Attends Religious Services: Not on file  . Active Member of Clubs or Organizations: Not on file  . Attends Banker Meetings: Not on file  . Marital Status: Not on file     Family History: The patient's family history includes Asthma in her mother; Heart  Problems in her father; Heart disease in her brother; Scoliosis in her son.  ROS:   Please see the history of present illness.     All other systems reviewed and are negative.  EKGs/Labs/Other Studies Reviewed:    The following studies were reviewed today:   EKG:  EKG is  ordered today.  The ekg ordered  11/20/20 shows sinus rhythm, rate 72, low voltage, no ST/T abnormalities.  Recent Labs: 02/15/2020: BUN 15; Creatinine, Ser 0.84; Hemoglobin 15.9; Platelets 322; Potassium 3.5; Sodium 142  Recent Lipid Panel No results found for: CHOL,  TRIG, HDL, CHOLHDL, VLDL, LDLCALC, LDLDIRECT  Physical Exam:    VS:  BP 124/76   Pulse 76   Ht 5' 3.5" (1.613 m)   Wt 277 lb (125.6 kg)   LMP 04/02/2006   SpO2 98%   BMI 48.30 kg/m     Wt Readings from Last 3 Encounters:  11/20/20 277 lb (125.6 kg)  05/30/20 262 lb 9.6 oz (119.1 kg)  05/22/20 243 lb (110.2 kg)     GEN:  Well nourished, well developed in no acute distress HEENT: Normal NECK: No JVD; No carotid bruits LYMPHATICS: No lymphadenopathy CARDIAC: RRR, no murmurs, rubs, gallops RESPIRATORY:  Clear to auscultation without rales, wheezing or rhonchi  ABDOMEN: Soft, non-tender, non-distended MUSCULOSKELETAL:  No edema; No deformity  SKIN: Warm and dry NEUROLOGIC:  Alert and oriented x 3 PSYCHIATRIC:  Normal affect   ASSESSMENT:    1. Chest pain of uncertain etiology    PLAN:    Chest pain: Reports episode of chest pain occurring with stress, concerning for angina.  Will evaluate for ischemia with Lexiscan Myoview  Palpitations/lightheadeness:Given significant increase in heart rate with standing, with nosignificant change in blood pressure,concerning for POTS.   No evidence of arrhythmias on Zio patch and no significant abnormalities on echocardiogram. -Discussed conservative measures for treatment of POTS.  Recommend avoiding dehydration.  If tolerated, compression stocking can assist with fluid management and prevent  pooling in the legs. Slow position changes are recommended.  If there is a feeling of severe lightheadedness, like near to passing out, recommend lying on the floor on the back, with legs elevated up on a chair or up against the wall. -the best long term management of POTS symptoms is gradual exercise conditioning.  She has been exercising using recumbent bicycle, recommended goal of 30 minutes/day   RTC in 6 months   The risks [chest pain, shortness of breath, cardiac arrhythmias, dizziness, blood pressure fluctuations, myocardial infarction, stroke/transient ischemic attack, nausea, vomiting, allergic reaction, radiation exposure, metallic taste sensation and life-threatening complications (estimated to be 1 in 10,000)], benefits (risk stratification, diagnosing coronary artery disease, treatment guidance) and alternatives of a nuclear stress test were discussed in detail with Ms. Desroches and she agrees to proceed.      Medication Adjustments/Labs and Tests Ordered: Current medicines are reviewed at length with the patient today.  Concerns regarding medicines are outlined above.  Orders Placed This Encounter  Procedures  . MYOCARDIAL PERFUSION IMAGING   No orders of the defined types were placed in this encounter.   Patient Instructions  Medication Instructions:  Your physician recommends that you continue on your current medications as directed. Please refer to the Current Medication list given to you today.  *If you need a refill on your cardiac medications before your next appointment, please call your pharmacy*  Testing/Procedures: Your physician has requested that you have a lexiscan myoview. For further information please visit [email or web address] Please follow instruction sheet, as given. This will take place at [address]  How to prepare for your Myocardial Perfusion Test:  Do not eat or drink 3 hours prior to your test, except you may have  water.  Do not consume products containing caffeine (regular or decaffeinated) 12 hours prior to your test. (ex: coffee, chocolate, sodas, tea).  Do bring a list of your current medications with you.  If not listed below, you may take your medications as normal.  Do wear comfortable clothes (no dresses or overalls) and walking shoes,  tennis shoes preferred (No heels or open toe shoes are allowed).  Do NOT wear cologne, perfume, aftershave, or lotions (deodorant is allowed).  The test will take approximately 3 to 4 hours to complete  If these instructions are not followed, your test will have to be rescheduled.  Follow-Up: At [facility], you and your health needs are our priority.  As part of our continuing mission to provide you with exceptional heart care, we have created designated Provider Care Teams.  These Care Teams include your primary Cardiologist (physician) and Advanced Practice Providers (APPs -  Physician Assistants and Nurse Practitioners) who all work together to provide you with the care you need, when you need it.  We recommend signing up for the patient portal called "MyChart".  Sign up information is provided on this After Visit Summary.  MyChart is used to connect with patients for Virtual Visits (Telemedicine).  Patients are able to view lab/test results, encounter notes, upcoming appointments, etc.  Non-urgent messages can be sent to your provider as well.   To learn more about what you can do with MyChart, go to [email or web address].    Your next appointment:   6 month(s)  The format for your next appointment:   In Person  Provider:   Epifanio Lescheshristopher Treyton Slimp, MD        Signed, [place] Ish[place] L Roland Lipke, MD  11/20/2020 11:00 PM    [facility]

## 2020-11-20 NOTE — Patient Instructions (Signed)
Medication Instructions:  Your physician recommends that you continue on your current medications as directed. Please refer to the Current Medication list given to you today.  *If you need a refill on your cardiac medications before your next appointment, please call your pharmacy*  Testing/Procedures: Your physician has requested that you have a lexiscan myoview. For further information please visit [email or web address] Please follow instruction sheet, as given. This will take place at [address]  How to prepare for your Myocardial Perfusion Test:  Do not eat or drink 3 hours prior to your test, except you may have water.  Do not consume products containing caffeine (regular or decaffeinated) 12 hours prior to your test. (ex: coffee, chocolate, sodas, tea).  Do bring a list of your current medications with you.  If not listed below, you may take your medications as normal.  Do wear comfortable clothes (no dresses or overalls) and walking shoes, tennis shoes preferred (No heels or open toe shoes are allowed).  Do NOT wear cologne, perfume, aftershave, or lotions (deodorant is allowed).  The test will take approximately 3 to 4 hours to complete  If these instructions are not followed, your test will have to be rescheduled.  Follow-Up: At [facility], you and your health needs are our priority.  As part of our continuing mission to provide you with exceptional heart care, we have created designated Provider Care Teams.  These Care Teams include your primary Cardiologist (physician) and Advanced Practice Providers (APPs -  Physician Assistants and Nurse Practitioners) who all work together to provide you with the care you need, when you need it.  We recommend signing up for the patient portal called "MyChart".  Sign up information is provided on this After Visit Summary.  MyChart is used to connect with patients for Virtual Visits (Telemedicine).  Patients are able  to view lab/test results, encounter notes, upcoming appointments, etc.  Non-urgent messages can be sent to your provider as well.   To learn more about what you can do with MyChart, go to [email or web address].    Your next appointment:   6 month(s)  The format for your next appointment:   In Person  Provider:   Epifanio Lesches, MD

## 2020-11-28 ENCOUNTER — Ambulatory Visit: Payer: No Typology Code available for payment source | Admitting: Physician Assistant

## 2020-11-28 ENCOUNTER — Encounter: Payer: Self-pay | Admitting: Physician Assistant

## 2020-11-28 ENCOUNTER — Other Ambulatory Visit: Payer: Self-pay

## 2020-11-28 VITALS — BP 153/82 | HR 80 | Resp 16 | Ht 63.0 in | Wt 281.0 lb

## 2020-11-28 DIAGNOSIS — I498 Other specified cardiac arrhythmias: Secondary | ICD-10-CM

## 2020-11-28 DIAGNOSIS — M7731 Calcaneal spur, right foot: Secondary | ICD-10-CM

## 2020-11-28 DIAGNOSIS — G90A Postural orthostatic tachycardia syndrome (POTS): Secondary | ICD-10-CM

## 2020-11-28 DIAGNOSIS — M7061 Trochanteric bursitis, right hip: Secondary | ICD-10-CM

## 2020-11-28 DIAGNOSIS — Z8719 Personal history of other diseases of the digestive system: Secondary | ICD-10-CM

## 2020-11-28 DIAGNOSIS — M797 Fibromyalgia: Secondary | ICD-10-CM | POA: Diagnosis not present

## 2020-11-28 DIAGNOSIS — Z8669 Personal history of other diseases of the nervous system and sense organs: Secondary | ICD-10-CM

## 2020-11-28 DIAGNOSIS — M170 Bilateral primary osteoarthritis of knee: Secondary | ICD-10-CM | POA: Diagnosis not present

## 2020-11-28 DIAGNOSIS — M5136 Other intervertebral disc degeneration, lumbar region: Secondary | ICD-10-CM

## 2020-11-28 DIAGNOSIS — M51369 Other intervertebral disc degeneration, lumbar region without mention of lumbar back pain or lower extremity pain: Secondary | ICD-10-CM

## 2020-11-28 DIAGNOSIS — M7732 Calcaneal spur, left foot: Secondary | ICD-10-CM

## 2020-11-28 DIAGNOSIS — F5101 Primary insomnia: Secondary | ICD-10-CM

## 2020-11-28 DIAGNOSIS — M7062 Trochanteric bursitis, left hip: Secondary | ICD-10-CM

## 2020-11-28 DIAGNOSIS — S92501D Displaced unspecified fracture of right lesser toe(s), subsequent encounter for fracture with routine healing: Secondary | ICD-10-CM

## 2020-11-28 DIAGNOSIS — R5383 Other fatigue: Secondary | ICD-10-CM

## 2020-11-28 DIAGNOSIS — E559 Vitamin D deficiency, unspecified: Secondary | ICD-10-CM

## 2020-11-28 MED ORDER — TIZANIDINE HCL 4 MG PO TABS: 4.0000 mg | ORAL_TABLET | Freq: Every evening | ORAL | 0 refills | Status: DC | PRN

## 2020-12-13 ENCOUNTER — Telehealth (HOSPITAL_COMMUNITY): Payer: Self-pay | Admitting: *Deleted

## 2020-12-13 NOTE — Telephone Encounter (Signed)
Patient given detailed instructions per Myocardial Perfusion Study Information Sheet for the test on 12/19/19 at 1:00. Patient notified to arrive 15 minutes early and that it is imperative to arrive on time for appointment to keep from having the test rescheduled.  If you need to cancel or reschedule your appointment, please call the office within 24 hours of your appointment. . Patient verbalized understanding.Christine [name]

## 2020-12-18 ENCOUNTER — Ambulatory Visit (HOSPITAL_COMMUNITY): Payer: No Typology Code available for payment source | Attending: Cardiology

## 2020-12-18 ENCOUNTER — Other Ambulatory Visit: Payer: Self-pay

## 2020-12-18 DIAGNOSIS — R079 Chest pain, unspecified: Secondary | ICD-10-CM

## 2020-12-18 MED ORDER — TECHNETIUM TC 99M TETROFOSMIN IV KIT
32.3000 | PACK | Freq: Once | INTRAVENOUS | Status: AC | PRN
  Administered 2020-12-18: 32.3 via INTRAVENOUS
  Filled 2020-12-18: qty 33

## 2020-12-18 MED ORDER — REGADENOSON 0.4 MG/5ML IV SOLN
0.4000 mg | Freq: Once | INTRAVENOUS | Status: AC
  Administered 2020-12-18: 0.4 mg via INTRAVENOUS

## 2020-12-19 ENCOUNTER — Ambulatory Visit (HOSPITAL_COMMUNITY): Payer: No Typology Code available for payment source | Attending: Cardiology

## 2020-12-19 LAB — MYOCARDIAL PERFUSION IMAGING
LV dias vol: 61 mL (ref 46–106)
LV sys vol: 12 mL
Peak HR: 104 {beats}/min
Rest HR: 81 {beats}/min
SDS: 0
SRS: 0
SSS: 0
TID: 0.89

## 2020-12-19 MED ORDER — TECHNETIUM TC 99M TETROFOSMIN IV KIT
32.9000 | PACK | Freq: Once | INTRAVENOUS | Status: AC | PRN
  Administered 2020-12-19: 32.9 via INTRAVENOUS
  Filled 2020-12-19: qty 33

## 2021-03-06 ENCOUNTER — Other Ambulatory Visit: Payer: Self-pay

## 2021-03-06 MED ORDER — METHOCARBAMOL 500 MG PO TABS: 500.0000 mg | ORAL_TABLET | Freq: Two times a day (BID) | ORAL | 2 refills | Status: DC | PRN

## 2021-03-06 MED ORDER — ZOLPIDEM TARTRATE 10 MG PO TABS: ORAL_TABLET | ORAL | 2 refills | Status: DC

## 2021-03-06 MED ORDER — TIZANIDINE HCL 4 MG PO TABS: 4.0000 mg | ORAL_TABLET | Freq: Every evening | ORAL | 0 refills | Status: DC | PRN

## 2021-03-06 NOTE — Telephone Encounter (Signed)
Patient called requesting prescription refills of Methocarbamol, Zolpidem, and Tizanidine to be sent to her NEW PHARMACY -  [facility] at [facility]

## 2021-03-06 NOTE — Telephone Encounter (Signed)
Next Visit: 05/29/2021  Last Visit: 11/28/2020  Last Fill:11/28/2020 Zanaflex, 05/30/2020 Robaxin and Ambien  Dx: Fibromyalgia  Current Dose per office note on 11/28/2020, She takes methocarbamol 500 mg twice daily as needed and tizanidine 4 mg a mouth at bedtime as needed for muscle spasms, Ambien 10 mg half tablet by mouth at bedtime as needed for insomnia  Okay to refill Methocarbamol, Zolpidem, and Tizanidine?

## 2021-03-30 IMAGING — DX DG CHEST 1V PORT
1 series · 1 of 1 positions shown · non-contrast
Comparison: April 13, 2007.

CLINICAL DATA: Palpitations.

EXAM:
PORTABLE CHEST 1 VIEW

[chest]
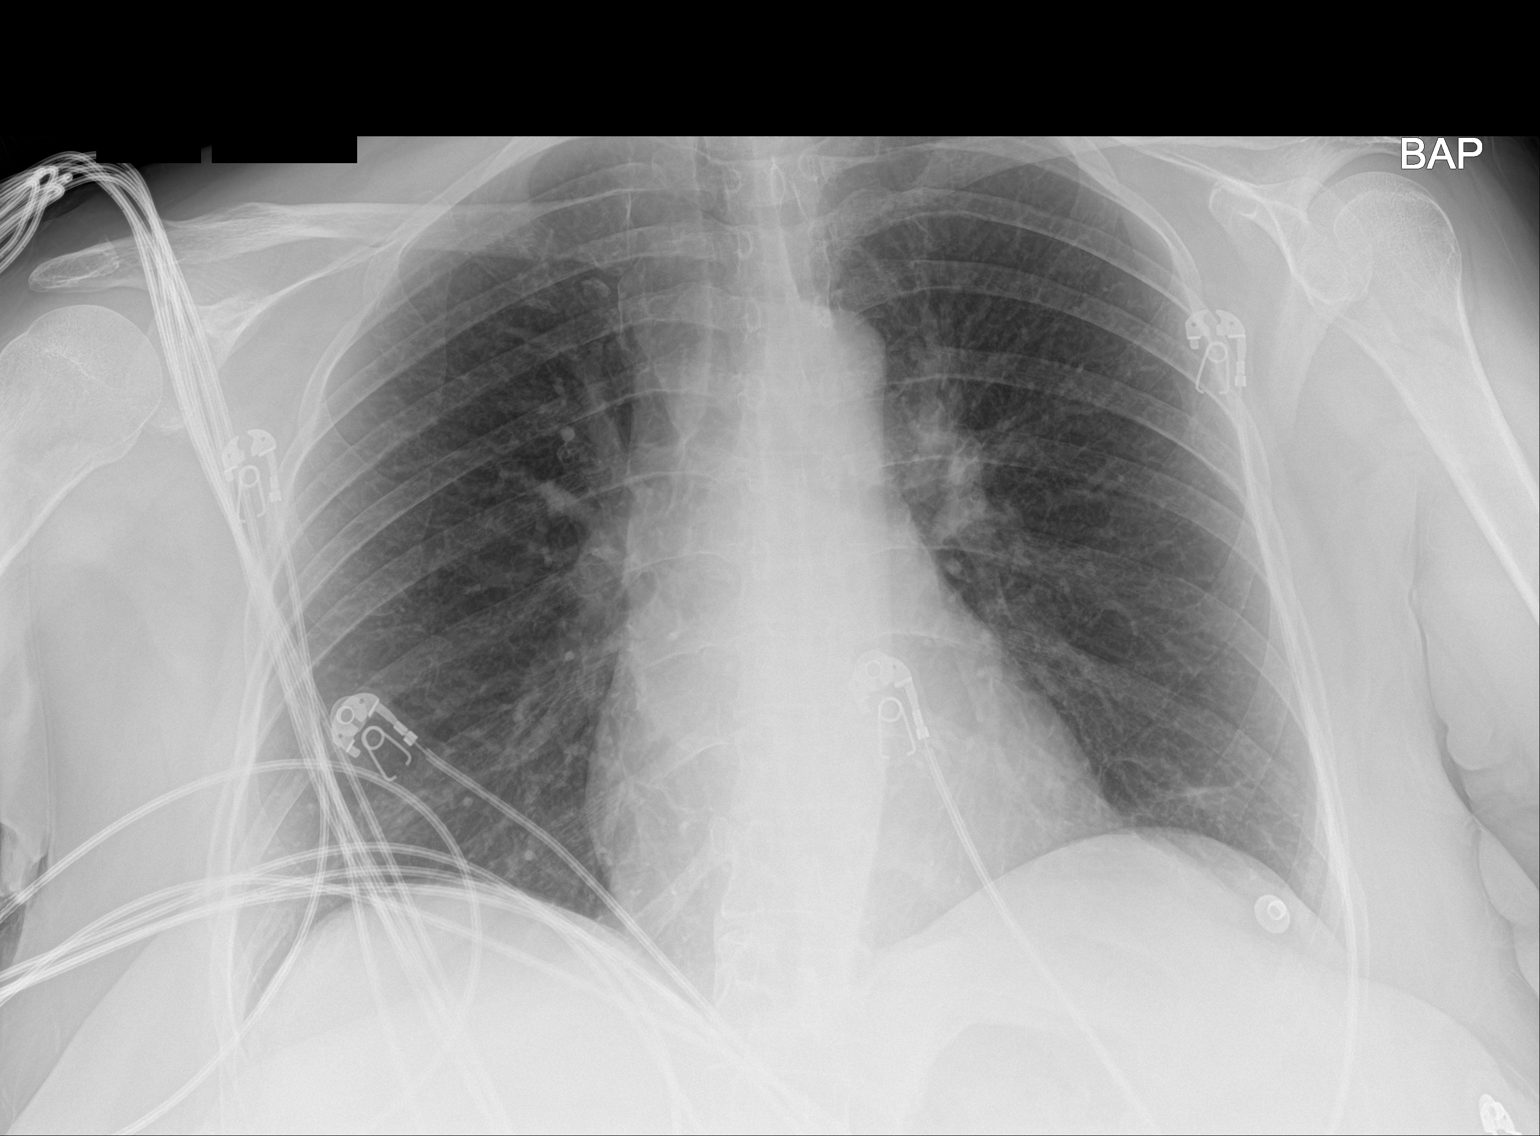

[1 of 1 positions shown; findings below may reference images not displayed]

FINDINGS: The heart size and mediastinal contours are within normal limits.
Both lungs are clear. The visualized skeletal structures are
unremarkable.
IMPRESSION: No active disease.

## 2021-05-15 NOTE — Progress Notes (Signed)
Office Visit Note  Patient: Christine [name]             Date of Birth: 07/24/1966           MRN: [id number]             PCP: Lance Bosch, NP Referring: Lance Bosch, NP Visit Date: 05/29/2021 Occupation: @GUAROCC @  Subjective:  Lower back pain.   History of Present Illness: LASHAN MACIAS is a 55 y.o. female with a history of osteoarthritis and degenerative disc disease.  She states last weekend while having sexual intercourse with her husband she started having severe pain in her lower back radiating down into her bilateral lower extremities.  She states after that she put some heated blanket around her lower extremities which helped but when she got up to go to the bathroom the symptoms recurred.  The symptoms are gradually getting better but she still has lower back pain and some pain in her bilateral lower extremities.  She continues to have some discomfort in the trochanteric area and her knee joints.  She states she has had lower back pain in the past with the sexual intercourse but never had radiculopathy.  On May 19, 2021 she developed palpitations and she went to the emergency room where she was diagnosed with atrial fibrillation.  She was placed on diltiazem.  Her fibromyalgia symptoms are stable.  Activities of Daily Living:  Patient reports morning stiffness for 5 minutes.   Patient Reports nocturnal pain.  Difficulty dressing/grooming: Denies Difficulty climbing stairs: Denies Difficulty getting out of chair: Denies Difficulty using hands for taps, buttons, cutlery, and/or writing: Denies  Review of Systems  Constitutional:  Positive for fatigue.  HENT:  Negative for mouth sores, mouth dryness and nose dryness.   Eyes:  Positive for dryness. Negative for pain and itching.  Respiratory:  Negative for shortness of breath and difficulty breathing.   Cardiovascular:  Negative for chest pain and palpitations.  Gastrointestinal:  Negative for blood in stool,  constipation and diarrhea.  Endocrine: Negative for increased urination.  Genitourinary:  Negative for difficulty urinating.  Musculoskeletal:  Positive for myalgias, morning stiffness, muscle tenderness and myalgias. Negative for joint pain, joint pain and joint swelling.  Skin:  Negative for color change, rash and redness.  Allergic/Immunologic: Negative for susceptible to infections.  Neurological:  Positive for weakness. Negative for dizziness, numbness, headaches and memory loss.  Hematological:  Positive for bruising/bleeding tendency.  Psychiatric/Behavioral:  Negative for confusion.    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   Trochanteric bursitis of both hips 10/18/2016   Primary osteoarthritis of both knees 10/18/2016   Chondromalacia, patella, unspecified laterality 10/18/2016   Bilateral calcaneal spurs 10/18/2016   Vitamin D deficiency 10/18/2016   UTI (urinary tract infection) 11/08/2014   Allergic rhinitis 02/21/2013   Migraine 02/21/2013    Past Medical History:  Diagnosis Date   Allergy    Diverticulitis    Fibromyalgia    POTS (postural orthostatic tachycardia syndrome)    per patient    Family History  Problem Relation Age of Onset   Asthma Mother    Heart Problems Father    Heart disease Brother    Scoliosis Son    Past Surgical History:  Procedure  Laterality Date   ABDOMINAL HYSTERECTOMY     CESAREAN SECTION     FINGER SURGERY     left ring finger x 2   FRACTURE SURGERY     TUBAL LIGATION     Social History   Social History Narrative   Not on file   Immunization History  Administered Date(s) Administered   PFIZER(Purple Top)SARS-COV-2 Vaccination 03/12/2020, 04/02/2020   Tdap 06/28/2018      Objective: Vital Signs: BP (!) 161/98 (BP Location: Left Wrist, Patient Position: Sitting, Cuff Size: Normal)   Pulse 71   Resp 15   Ht 5\' 3"  (1.6 m)   Wt 290 lb (131.5 kg)   LMP 04/02/2006   BMI 51.37 kg/m    Physical Exam Vitals and nursing note reviewed.  Constitutional:      Appearance: She is well-developed.  HENT:     Head: Normocephalic and atraumatic.  Eyes:     Conjunctiva/sclera: Conjunctivae normal.  Cardiovascular:     Rate and Rhythm: Normal rate and regular rhythm.     Heart sounds: Normal heart sounds.  Pulmonary:     Effort: Pulmonary effort is normal.     Breath sounds: Normal breath sounds.  Abdominal:     General: Bowel sounds are normal.     Palpations: Abdomen is soft.  Musculoskeletal:     Cervical back: Normal range of motion.  Lymphadenopathy:     Cervical: No cervical adenopathy.  Skin:    General: Skin is warm and dry.     Capillary Refill: Capillary refill takes less than 2 seconds.  Neurological:     Mental Status: She is alert and oriented to person, place, and time.  Psychiatric:        Behavior: Behavior normal.     Musculoskeletal Exam: C-spine was in good range of motion.  She had discomfort with range of motion of her lumbar spine and some point tenderness in the upper lumbar region.  Shoulder joints, elbow joints, wrist joints, MCPs PIPs and DIPs with good range of motion with no synovitis.  Hip joints, knee joints, ankles, MTPs and PIPs with good range of motion with no synovitis.  She has some tenderness over trochanteric area.  CDAI Exam: CDAI Score: -- Patient Global: --; Provider Global: -- Swollen: --; Tender: -- Joint Exam 05/29/2021   No joint exam has been documented for this visit   There is currently no information documented on the homunculus. Go to the Rheumatology activity and complete the homunculus joint exam.  Investigation: No additional findings.  Imaging: DG Chest 2 View  Result Date:  05/19/2021 CLINICAL DATA:  Chest pain, finger numbness EXAM: CHEST - 2 VIEW COMPARISON:  Chest x-ray dated 02/15/2020 FINDINGS: The heart size and mediastinal contours are within normal limits. Both lungs are clear. The visualized skeletal structures are unremarkable. IMPRESSION: No active cardiopulmonary disease. Electronically Signed   By: 04/16/2020 M.D.   On: 05/19/2021 20:07    Recent Labs: Lab Results  Component Value Date   WBC 7.0 05/19/2021   HGB 15.8 (H) 05/19/2021   PLT 368 05/19/2021   NA 140 05/19/2021   K 4.3 05/19/2021   CL 103 05/19/2021   CO2 25 05/19/2021   GLUCOSE 98 05/19/2021   BUN 7 05/19/2021   CREATININE 0.82 05/19/2021   BILITOT 1.1 05/19/2021   ALKPHOS 59 05/19/2021   AST 28 05/19/2021   ALT 29 05/19/2021   PROT 7.3 05/19/2021   ALBUMIN 4.0 05/19/2021  CALCIUM 10.0 05/19/2021   GFRAA >60 02/15/2020    Speciality Comments: No specialty comments available.  Procedures:  No procedures performed Allergies: Other, Penicillins, and Sulfa antibiotics   Assessment / Plan:     Visit Diagnoses: Low back pain radiating to lower extremity -patient has been experiencing increased pain and discomfort in her lower back since last weekend.  She states the pain was severe and was going to her lower extremities.  The pain has eased off since to some extent but she still have a lot of discomfort.  Plan: XR Lumbar Spine 2-3 Views.  X-rays obtained today showed mild anterior spurring and significant facet joint arthropathy.  X-ray findings were discussed with the patient.  Have given her a handout on back exercises.  I have also refer her to physical therapy.  DDD (degenerative disc disease), lumbar-she has known history of mild disc disease and facet joint arthropathy.  Trochanteric bursitis of both hips-she continues to have some discomfort in the trochanteric region.  IT band stretches were discussed.  Primary osteoarthritis of both knees-she denies any knee joint  pain currently.  Weight loss diet and exercise was discussed.  Bilateral calcaneal spurs-she is off-and-on discomfort while walking.  Fibromyalgia -her fibromyalgia symptoms are manageable on methocarbamol 500 mg twice daily as needed and tizanidine 4 mg a mouth at bedtime as needed for muscle spasms.  She usually takes muscle relaxers during the daytime on as needed basis but recently she has been taking on a regular basis due to lower back pain.  Other fatigue-she continues to have some fatigue.  Primary insomnia - Ambien 10 mg half tablet by mouth at bedtime for insomnia.  I will change her next prescription of Ambien to 5 mg p.o. nightly.  She was in agreement.  Side effects of Ambien were reviewed.  Other medical problems are listed as follows:  POTS (postural orthostatic tachycardia syndrome)  Vitamin D deficiency  History of IBS  History of migraine  Orders: Orders Placed This Encounter  Procedures   XR Lumbar Spine 2-3 Views    No orders of the defined types were placed in this encounter.   Face-to-face time spent with patient was . Greater than 50% of time was spent in counseling and coordination of care.  Follow-Up Instructions: Return in about 6 months (around 11/28/2021) for Osteoarthritis.   Pollyann Savoy, MD  Note - This record has been created using Animal nutritionist.  Chart creation errors have been sought, but may not always  have been located. Such creation errors do not reflect on  the standard of medical care.

## 2021-05-19 ENCOUNTER — Encounter (HOSPITAL_COMMUNITY): Payer: Self-pay | Admitting: Emergency Medicine

## 2021-05-19 ENCOUNTER — Emergency Department (HOSPITAL_COMMUNITY)
Admission: EM | Admit: 2021-05-19 | Discharge: 2021-05-20 | Disposition: A | Discharge status: Home / Self Care | Payer: No Typology Code available for payment source | Attending: Emergency Medicine | Admitting: Emergency Medicine

## 2021-05-19 ENCOUNTER — Emergency Department (HOSPITAL_COMMUNITY): Payer: No Typology Code available for payment source

## 2021-05-19 ENCOUNTER — Other Ambulatory Visit: Payer: Self-pay

## 2021-05-19 DIAGNOSIS — I4891 Unspecified atrial fibrillation: Secondary | ICD-10-CM | POA: Diagnosis not present

## 2021-05-19 DIAGNOSIS — Z87891 Personal history of nicotine dependence: Secondary | ICD-10-CM | POA: Diagnosis not present

## 2021-05-19 DIAGNOSIS — R079 Chest pain, unspecified: Secondary | ICD-10-CM

## 2021-05-19 DIAGNOSIS — Z79899 Other long term (current) drug therapy: Secondary | ICD-10-CM | POA: Diagnosis not present

## 2021-05-19 DIAGNOSIS — R002 Palpitations: Secondary | ICD-10-CM | POA: Diagnosis present

## 2021-05-19 LAB — CBC WITH DIFFERENTIAL/PLATELET
Abs Immature Granulocytes: 0.01 10*3/uL (ref 0.00–0.07)
Basophils Absolute: 0.1 10*3/uL (ref 0.0–0.1)
Basophils Relative: 1 %
Eosinophils Absolute: 0.1 10*3/uL (ref 0.0–0.5)
Eosinophils Relative: 1 %
HCT: 48 % — ABNORMAL HIGH (ref 36.0–46.0)
Hemoglobin: 15.8 g/dL — ABNORMAL HIGH (ref 12.0–15.0)
Immature Granulocytes: 0 %
Lymphocytes Relative: 38 %
Lymphs Abs: 2.7 10*3/uL (ref 0.7–4.0)
MCH: 29.5 pg (ref 26.0–34.0)
MCHC: 32.9 g/dL (ref 30.0–36.0)
MCV: 89.7 fL (ref 80.0–100.0)
Monocytes Absolute: 0.4 10*3/uL (ref 0.1–1.0)
Monocytes Relative: 6 %
Neutro Abs: 3.7 10*3/uL (ref 1.7–7.7)
Neutrophils Relative %: 54 %
Platelets: 368 10*3/uL (ref 150–400)
RBC: 5.35 MIL/uL — ABNORMAL HIGH (ref 3.87–5.11)
RDW: 13.2 % (ref 11.5–15.5)
WBC: 7 10*3/uL (ref 4.0–10.5)
nRBC: 0 % (ref 0.0–0.2)

## 2021-05-19 LAB — COMPREHENSIVE METABOLIC PANEL
ALT: 29 U/L (ref 0–44)
AST: 28 U/L (ref 15–41)
Albumin: 4 g/dL (ref 3.5–5.0)
Alkaline Phosphatase: 59 U/L (ref 38–126)
Anion gap: 12 (ref 5–15)
BUN: 7 mg/dL (ref 6–20)
CO2: 25 mmol/L (ref 22–32)
Calcium: 10 mg/dL (ref 8.9–10.3)
Chloride: 103 mmol/L (ref 98–111)
Creatinine, Ser: 0.82 mg/dL (ref 0.44–1.00)
GFR, Estimated: >60 mL/min (ref >60)
Glucose, Bld: 98 mg/dL (ref 70–99)
Potassium: 4.3 mmol/L (ref 3.5–5.1)
Sodium: 140 mmol/L (ref 135–145)
Total Bilirubin: 1.1 mg/dL (ref 0.3–1.2)
Total Protein: 7.3 g/dL (ref 6.5–8.1)

## 2021-05-19 LAB — TROPONIN I (HIGH SENSITIVITY)
Troponin I (High Sensitivity): 5 ng/L (ref <18)
Troponin I (High Sensitivity): 6 ng/L (ref <18)

## 2021-05-19 MED ORDER — DILTIAZEM HCL 30 MG PO TABS: 30.0000 mg | ORAL_TABLET | Freq: Four times a day (QID) | ORAL | 0 refills | Status: DC | PRN

## 2021-05-19 NOTE — ED Provider Notes (Addendum)
Emergency Medicine Provider Triage Evaluation Note  Christine [name] , a 55 y.o. female  was evaluated in triage.  Pt complains of intermittent centralized chest pain and tingling in left middle finger x 2 days. Has history of afib and pots. She feels dizzy when changing positions.  Heart rate ranging from 55-170 per apple watch. She states she was sick last weekend with GI illness. Not anticoagualted  Review of Systems  Positive: Chest pain, shortness of breath, headache Negative: Fever, back pain  Physical Exam  BP (!) 190/136 (BP Location: Left Arm)   Pulse 89   Temp 98.7 F (37.1 C) (Oral)   Resp 20   LMP 04/02/2006   SpO2 100%  Gen:   Awake, no distress   Resp:  Normal effort  MSK:   Moves extremities without difficulty  Other:  Strong and equal grip strength in all extremities. Clear speech, no facial droop  Medical Decision Making  Medically screening exam initiated at 7:17 PM.  Appropriate orders placed.  Christine [name] was informed that the remainder of the evaluation will be completed by another provider, this initial triage assessment does not replace that evaluation, and the importance of remaining in the ED until their evaluation is complete.  Hypertensive in triage. EKG shows heart rate 145. HR per apple watch during time of my exam is in the 80s. RN informed patient needs to be roomed as soon as possible. Basic labs, troponin, and chest xray ordered.   Shanon Ace, PA-C 05/19/21 1923    Shanon Ace, PA-C 05/19/21 1924    Wynetta Fines, MD 05/20/21 2050

## 2021-05-19 NOTE — ED Notes (Signed)
Pt ambulated to the room. Pt states that her apple watch was telling her that her heart rate was elevated up to 170 and said she was in Afib. No history of afib. Pt had c/o of central chest pain and sometimes radiates to left jaw and left middle finger.

## 2021-05-19 NOTE — ED Provider Notes (Signed)
[facility] EMERGENCY DEPARTMENT Provider Note   CSN: [id number] Arrival date & time: 05/19/21  1800     History Chief Complaint  Patient presents with  . Palpitations  . Chest Pain    Christine [name] is a 55 y.o. female.  The history is provided by the patient and medical records.  Palpitations Associated symptoms: chest pain   Chest Pain Associated symptoms: palpitations    Christine [name] is a 55 y.o. female who presents to the Emergency Department complaining of palpitations and chest pain.  Started yestrday.  HR to 170. Symptoms are constant in nature. Had a similar episode last year that resolved prior to ED presentation. She is being treated for pots currently. No associated fever but she has been feeling cold lately. Last week she had a G.I. bug with nausea, vomiting, diarrhea. This is now resolved. She has mild congestion when the palpitations are bad. No dysuria. Symptoms are moderate to severe.       Past Medical History:  Diagnosis Date  . Allergy   . Diverticulitis   . Fibromyalgia   . POTS (postural orthostatic tachycardia syndrome)    per patient    Patient Active Problem List   Diagnosis Date Noted  . IBS (irritable bowel syndrome) 10/18/2016  . Fibromyalgia 10/18/2016  . Fatigue 10/18/2016  . Primary insomnia 10/18/2016  . 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 Trochanteric bursitis of both hips 10/18/2016  . Primary osteoarthritis of both knees 10/18/2016  . 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 Bilateral calcaneal spurs 10/18/2016  . Vitamin D deficiency 10/18/2016  . UTI (urinary tract infection) 11/08/2014  . Allergic rhinitis 02/21/2013  . Migraine 02/21/2013    Past Surgical History:  Procedure Laterality Date  . ABDOMINAL HYSTERECTOMY    . CESAREAN SECTION    . FINGER SURGERY     left ring finger x 2  . FRACTURE SURGERY    . TUBAL LIGATION       OB History   No obstetric  history on file.     Family History  Problem Relation Age of Onset  . Asthma Mother   . Heart Problems Father   . Heart disease Brother   . Scoliosis Son     Social History   Tobacco Use  . Smoking status: Former Smoker    Packs/day: 0.25    Years: 2.00    Pack years: 0.50    Types: Cigarettes    Quit date: 02/22/1988    Years since quitting: 33.2  . Smokeless tobacco: Never Used  Vaping Use  . Vaping Use: Never used  Substance Use Topics  . Alcohol use: No  . Drug use: No    Home Medications Prior to Admission medications   Medication Sig Start Date End Date Taking? Authorizing Provider  cholecalciferol (VITAMIN D) 1000 UNITS tablet Take 10,000 Units by mouth at bedtime.    Yes [provider]  diltiazem (CARDIZEM) 30 MG tablet Take 1 tablet (30 mg total) by mouth every 6 (six) hours as needed (palpitations). 05/19/21  Yes Tilden Fossa, MD  fish oil-omega-3 fatty acids 1000 MG capsule Take 1 g by mouth 3 (three) times a week.   Yes [provider]  hydrOXYzine (VISTARIL) 25 MG capsule Take 25 mg by mouth every 6 (six) hours as needed for anxiety. 04/20/20  Yes [provider]  hyoscyamine (LEVSIN SL) 0.125 MG SL tablet Place 0.125 mg under the tongue every 4 (four)  hours as needed for cramping.  09/06/19  Yes [provider]  MAGNESIUM PO Take 1 tablet by mouth at bedtime.   Yes [provider]  methocarbamol (ROBAXIN) 500 MG tablet Take 1 tablet (500 mg total) by mouth 2 (two) times daily as needed for muscle spasms. 03/06/21  Yes Gearldine Bienenstock, PA-C  tiZANidine (ZANAFLEX) 4 MG tablet Take 1 tablet (4 mg total) by mouth at bedtime as needed for muscle spasms. Patient taking differently: Take 4 mg by mouth at bedtime. 03/06/21  Yes Gearldine Bienenstock, PA-C  zolpidem (AMBIEN) 10 MG tablet Take a half tablet (5 mg total) by mouth at bedtime as needed for insomnia. 03/06/21  Yes Gearldine Bienenstock, PA-C    Allergies    Other, Penicillins, and  Sulfa antibiotics  Review of Systems   Review of Systems  Cardiovascular: Positive for chest pain and palpitations.  All other systems reviewed and are negative.   Physical Exam Updated Vital Signs BP 128/77   Pulse 78   Temp 98.3 F (36.8 C) (Oral)   Resp 13   Ht 5\' 3"  (1.6 m)   Wt 122.5 kg   LMP 04/02/2006   SpO2 98%   BMI 47.83 kg/m   Physical Exam Vitals and nursing note reviewed.  Constitutional:      Appearance: She is well-developed.  HENT:     Head: Normocephalic and atraumatic.  Cardiovascular:     Rate and Rhythm: Normal rate and regular rhythm.     Heart sounds: No murmur heard.   Pulmonary:     Effort: Pulmonary effort is normal. No respiratory distress.     Breath sounds: Normal breath sounds.  Abdominal:     Palpations: Abdomen is soft.     Tenderness: There is no abdominal tenderness. There is no guarding or rebound.  Musculoskeletal:        General: No swelling or tenderness.  Skin:    General: Skin is warm and dry.  Neurological:     Mental Status: She is alert and oriented to person, place, and time.  Psychiatric:        Behavior: Behavior normal.     ED Results / Procedures / Treatments   Labs (all labs ordered are listed, but only abnormal results are displayed) Labs Reviewed  CBC WITH DIFFERENTIAL/PLATELET - Abnormal; Notable for the following components:      Result Value   RBC 5.35 (*)    Hemoglobin 15.8 (*)    HCT 48.0 (*)    All other components within normal limits  COMPREHENSIVE METABOLIC PANEL  TROPONIN I (HIGH SENSITIVITY)  TROPONIN I (HIGH SENSITIVITY)    EKG EKG Interpretation  Date/Time:  Saturday May 19 2021 19:10:38 EDT Ventricular Rate:  145 PR Interval:    QRS Duration: 72 QT Interval:  288 QTC Calculation: 447 R Axis:   87 Text Interpretation: Atrial fibrillation with rapid ventricular response Low voltage QRS Nonspecific ST abnormality Abnormal ECG Confirmed by 08-01-1972 [phone]) on 05/19/2021 9:56:38  PM   Radiology DG Chest 2 View  Result Date: 05/19/2021 CLINICAL DATA:  Chest pain, finger numbness EXAM: CHEST - 2 VIEW COMPARISON:  Chest x-ray dated 02/15/2020 FINDINGS: The heart size and mediastinal contours are within normal limits. Both lungs are clear. The visualized skeletal structures are unremarkable. IMPRESSION: No active cardiopulmonary disease. Electronically Signed   By: 04/16/2020 M.D.   On: 05/19/2021 20:07    Procedures Procedures   Medications Ordered in ED Medications -  No data to display  ED Course  I have reviewed the triage vital signs and the nursing notes.  Pertinent labs & imaging results that were available during my care of the patient were reviewed by me and considered in my medical decision making (see chart for details).    MDM Rules/Calculators/A&P                         patient with history of pots here for evaluation of two days of palpitations. She did recently have a G.I. bug. Presenting EKG with a fib with RVR. On patient's placement in a room she has converted spontaneously to sinus rhythm. She does have some ongoing chest discomfort. Labs are reassuring. No significant electrolyte abnormalities. Troponins are negative times two. Discussed with on-call cardiologist. Will prescribe PRN Cardizem with close cardiology follow-up and return precautions.   This patients CHA2DS2-VASc Score and unadjusted Ischemic Stroke Rate (% per year) is equal to 0.6 % stroke rate/year from a score of 1  Above score calculated as 1 point each if present [CHF, HTN, DM, Vascular=MI/PAD/Aortic Plaque, Age if 65-74, or Female] Above score calculated as 2 points each if present [Age > 75, or Stroke/TIA/TE]      Final Clinical Impression(s) / ED Diagnoses Final diagnoses:  Atrial fibrillation with RVR [facility])    Rx / DC Orders ED Discharge Orders         Ordered    Amb Referral to AFIB Clinic        05/19/21 2244    diltiazem (CARDIZEM) 30 MG tablet  Every 6  hours PRN        05/19/21 2340           Tilden Fossa, MD 05/19/21 2353

## 2021-05-19 NOTE — ED Triage Notes (Signed)
Pt reports high heart rate for the past two days, unable to bring it down despite "home tricks".  Had "full work up" in March.  Was sick last week., substernal chest pains that comes and goes and tingling in middle finger.

## 2021-05-20 NOTE — ED Notes (Signed)
Patient verbalizes understanding of discharge instructions. Opportunity for questioning and answers were provided. Armband removed by staff, pt discharged from ED ambulatory.   

## 2021-05-21 ENCOUNTER — Ambulatory Visit: Payer: Self-pay | Admitting: Cardiology

## 2021-05-24 ENCOUNTER — Ambulatory Visit (HOSPITAL_COMMUNITY)
Admission: RE | Admit: 2021-05-24 | Discharge: 2021-05-24 | Disposition: A | Discharge status: Home / Self Care | Payer: No Typology Code available for payment source | Source: Ambulatory Visit | Attending: Physician Assistant | Admitting: Physician Assistant

## 2021-05-24 ENCOUNTER — Other Ambulatory Visit: Payer: Self-pay

## 2021-05-24 ENCOUNTER — Encounter (HOSPITAL_COMMUNITY): Payer: Self-pay | Admitting: Physician Assistant

## 2021-05-24 VITALS — BP 132/86 | HR 74 | Ht 63.0 in | Wt 287.4 lb

## 2021-05-24 DIAGNOSIS — Z880 Allergy status to penicillin: Secondary | ICD-10-CM | POA: Diagnosis not present

## 2021-05-24 DIAGNOSIS — Z888 Allergy status to other drugs, medicaments and biological substances status: Secondary | ICD-10-CM | POA: Insufficient documentation

## 2021-05-24 DIAGNOSIS — Z6841 Body Mass Index (BMI) 40.0 and over, adult: Secondary | ICD-10-CM | POA: Insufficient documentation

## 2021-05-24 DIAGNOSIS — E669 Obesity, unspecified: Secondary | ICD-10-CM | POA: Diagnosis not present

## 2021-05-24 DIAGNOSIS — Z882 Allergy status to sulfonamides status: Secondary | ICD-10-CM | POA: Insufficient documentation

## 2021-05-24 DIAGNOSIS — Z8249 Family history of ischemic heart disease and other diseases of the circulatory system: Secondary | ICD-10-CM | POA: Insufficient documentation

## 2021-05-24 DIAGNOSIS — M797 Fibromyalgia: Secondary | ICD-10-CM | POA: Insufficient documentation

## 2021-05-24 DIAGNOSIS — I480 Paroxysmal atrial fibrillation: Secondary | ICD-10-CM

## 2021-05-24 DIAGNOSIS — Z79899 Other long term (current) drug therapy: Secondary | ICD-10-CM | POA: Insufficient documentation

## 2021-05-24 DIAGNOSIS — Z87891 Personal history of nicotine dependence: Secondary | ICD-10-CM | POA: Insufficient documentation

## 2021-05-24 NOTE — Progress Notes (Signed)
Primary Care Physician: Lance Bosch, NP Primary Cardiologist: Dr Bjorn Pippin Primary Electrophysiologist: none Referring Physician: [facility]   Christine [name] is a 55 y.o. female with a history of fibromyalgia, IBS, and atrial fibrillation who presents for consultation in the [facility].  The patient was initially diagnosed with atrial fibrillation 05/19/21 after presenting to the ED with palpitations. She has had palpitations and chest pain off and on but workup thus far had not revealed any arrhythmias. ECG on presentation to the ED showed afib with RVR. She spontaneously converted to SR. Patient has a CHADS2VASC score of 1. She was given PRN diltiazem. Patient reports that she has not had any further episodes of heart racing. She denies significant alcohol use. She had a negative sleep study several years ago which was negative for OSA.  Today, she denies symptoms of chest pain, shortness of breath, orthopnea, PND, lower extremity edema, dizziness, presyncope, syncope, snoring, daytime somnolence, bleeding, or neurologic sequela. The patient is tolerating medications without difficulties and is otherwise without complaint today.    Atrial Fibrillation Risk Factors:  she does not have symptoms or diagnosis of sleep apnea. she does not have a history of rheumatic fever. she does not have a history of alcohol use. The patient does not have a history of early familial atrial fibrillation or other arrhythmias.  she has a BMI of Body mass index is 50.91 kg/m.Marland Kitchen Filed Weights   05/24/21 1501  Weight: 130.4 kg    Family History  Problem Relation Age of Onset   Asthma Mother    Heart Problems Father    Heart disease Brother    Scoliosis Son      Atrial Fibrillation Management history:  Previous antiarrhythmic drugs: none Previous cardioversions: none Previous ablations: none CHADS2VASC score: 1 Anticoagulation history: none   Past Medical  History:  Diagnosis Date   Allergy    Diverticulitis    Fibromyalgia    POTS (postural orthostatic tachycardia syndrome)    per patient   Past Surgical History:  Procedure Laterality Date   ABDOMINAL HYSTERECTOMY     CESAREAN SECTION     FINGER SURGERY     left ring finger x 2   FRACTURE SURGERY     TUBAL LIGATION      Current Outpatient Medications  Medication Sig Dispense Refill   cholecalciferol (VITAMIN D) 1000 UNITS tablet Take 10,000 Units by mouth at bedtime.      diltiazem (CARDIZEM) 30 MG tablet Take 1 tablet (30 mg total) by mouth every 6 (six) hours as needed (palpitations). 30 tablet 0   fish oil-omega-3 fatty acids 1000 MG capsule Take 1 g by mouth 3 (three) times a week.     hydrOXYzine (VISTARIL) 25 MG capsule Take 25 mg by mouth every 6 (six) hours as needed for anxiety.     hyoscyamine (LEVSIN SL) 0.125 MG SL tablet Place 0.125 mg under the tongue every 4 (four) hours as needed for cramping.      MAGNESIUM PO Take 1 tablet by mouth at bedtime.     methocarbamol (ROBAXIN) 500 MG tablet Take 1 tablet (500 mg total) by mouth 2 (two) times daily as needed for muscle spasms. 60 tablet 2   tiZANidine (ZANAFLEX) 4 MG tablet Take 1 tablet (4 mg total) by mouth at bedtime as needed for muscle spasms. 90 tablet 0   zolpidem (AMBIEN) 10 MG tablet Take a half tablet (5 mg total) by mouth at bedtime  as needed for insomnia. 30 tablet 2   No current facility-administered medications for this encounter.    Allergies  Allergen Reactions   Other     "an IBS med"   Penicillins Hives   Sulfa Antibiotics Hives    Social History   Socioeconomic History   Marital status: Married    Spouse name: Not on file   Number of children: Not on file   Years of education: Not on file   Highest education level: Not on file  Occupational History   Occupation: Agricultural engineer: [employer]  Tobacco Use   Smoking status: Former    Packs/day: 0.25    Years: 2.00    Pack  years: 0.50    Types: Cigarettes    Quit date: 02/22/1988    Years since quitting: 33.2   Smokeless tobacco: Never  Vaping Use   Vaping Use: Never used  Substance and Sexual Activity   Alcohol use: Yes    Alcohol/week: 1.0 standard drink    Types: 1 Glasses of wine per week    Comment: monthly   Drug use: No   Sexual activity: Yes    Birth control/protection: Surgical  Other Topics Concern   Not on file  Social History Narrative   Not on file   Social Determinants of Health   Financial Resource Strain: Not on file  Food Insecurity: Not on file  Transportation Needs: Not on file  Physical Activity: Not on file  Stress: Not on file  Social Connections: Not on file  Intimate Partner Violence: Not on file     ROS- All systems are reviewed and negative except as per the HPI above.  Physical Exam: Vitals:   05/24/21 1501  BP: 132/86  Pulse: 74  Weight: 130.4 kg  Height: 5\' 3"  (1.6 m)    GEN- The patient is a well appearing obese female, alert and oriented x 3 today.   Head- normocephalic, atraumatic Eyes-  Sclera clear, conjunctiva pink Ears- hearing intact Oropharynx- clear Neck- supple  Lungs- Clear to ausculation bilaterally, normal work of breathing Heart- Regular rate and rhythm, no murmurs, rubs or gallops  GI- soft, NT, ND, + BS Extremities- no clubbing, cyanosis, or edema MS- no significant deformity or atrophy Skin- no rash or lesion Psych- euthymic mood, full affect Neuro- strength and sensation are intact  Wt Readings from Last 3 Encounters:  05/24/21 130.4 kg  05/19/21 122.5 kg  11/28/20 127.5 kg    EKG today demonstrates  SR, PAC Vent. rate 74 BPM PR interval 176 ms QRS duration 64 ms QT/QTcB 388/430 ms  Echo 03/13/20 demonstrated   1. Normal LV function; mild MR.   2. Left ventricular ejection fraction, by estimation, is 60 to 65%. The  left ventricle has normal function. The left ventricle has no regional  wall motion abnormalities.  Left ventricular diastolic parameters were  normal.   3. Right ventricular systolic function is normal. The right ventricular  size is normal. There is normal pulmonary artery systolic pressure.   4. The mitral valve is normal in structure. Mild mitral valve  regurgitation. No evidence of mitral stenosis.   5. The aortic valve is tricuspid. Aortic valve regurgitation is not  visualized. Mild aortic valve sclerosis is present, with no evidence of  aortic valve stenosis.   6. The inferior vena cava is normal in size with greater than 50%  respiratory variability, suggesting right atrial pressure of 3 mmHg.   Epic  records are reviewed at length today  CHA2DS2-VASc Score = 1  The patient's score is based upon: CHF History: No HTN History: No Diabetes History: No Stroke History: No Vascular Disease History: No Age Score: 0 Gender Score: 1     ASSESSMENT AND PLAN: 1. Paroxysmal Atrial Fibrillation (ICD10:  I48.0) The patient's CHA2DS2-VASc score is 1, indicating a 0.6% annual risk of stroke.   General education about afib provided and questions answered. We also discussed her stroke risk and the risks and benefits of anticoagulation. We discussed therapeutic options today including daily rate control vs AAD. Not likely a good ablation candidate with her weight. She would prefer to try PRN diltiazem for now. Continue diltiazem 30 mg PRN q 4 hours for heart racing  2. Obesity Body mass index is 50.91 kg/m. Lifestyle modification was discussed at length including regular exercise and weight reduction.   Follow up with Dr Bjorn Pippin as scheduled. AF clinic in 6 months.    [name] PA-C [facility] [address] [place], [place] [address] [phone] 05/24/2021 3:33 PM

## 2021-05-29 ENCOUNTER — Other Ambulatory Visit: Payer: Self-pay

## 2021-05-29 ENCOUNTER — Ambulatory Visit: Payer: Self-pay

## 2021-05-29 ENCOUNTER — Encounter: Payer: Self-pay | Admitting: Rheumatology

## 2021-05-29 ENCOUNTER — Ambulatory Visit: Payer: No Typology Code available for payment source | Admitting: Rheumatology

## 2021-05-29 VITALS — BP 161/98 | HR 71 | Resp 15 | Ht 63.0 in | Wt 290.0 lb

## 2021-05-29 DIAGNOSIS — F5101 Primary insomnia: Secondary | ICD-10-CM

## 2021-05-29 DIAGNOSIS — M545 Low back pain, unspecified: Secondary | ICD-10-CM | POA: Diagnosis not present

## 2021-05-29 DIAGNOSIS — M79606 Pain in leg, unspecified: Secondary | ICD-10-CM

## 2021-05-29 DIAGNOSIS — R5383 Other fatigue: Secondary | ICD-10-CM

## 2021-05-29 DIAGNOSIS — M7732 Calcaneal spur, left foot: Secondary | ICD-10-CM

## 2021-05-29 DIAGNOSIS — S92501D Displaced unspecified fracture of right lesser toe(s), subsequent encounter for fracture with routine healing: Secondary | ICD-10-CM

## 2021-05-29 DIAGNOSIS — M797 Fibromyalgia: Secondary | ICD-10-CM

## 2021-05-29 DIAGNOSIS — M170 Bilateral primary osteoarthritis of knee: Secondary | ICD-10-CM

## 2021-05-29 DIAGNOSIS — M7062 Trochanteric bursitis, left hip: Secondary | ICD-10-CM

## 2021-05-29 DIAGNOSIS — E559 Vitamin D deficiency, unspecified: Secondary | ICD-10-CM

## 2021-05-29 DIAGNOSIS — I498 Other specified cardiac arrhythmias: Secondary | ICD-10-CM

## 2021-05-29 DIAGNOSIS — Z8719 Personal history of other diseases of the digestive system: Secondary | ICD-10-CM

## 2021-05-29 DIAGNOSIS — M5136 Other intervertebral disc degeneration, lumbar region: Secondary | ICD-10-CM

## 2021-05-29 DIAGNOSIS — G90A Postural orthostatic tachycardia syndrome (POTS): Secondary | ICD-10-CM

## 2021-05-29 DIAGNOSIS — Z8669 Personal history of other diseases of the nervous system and sense organs: Secondary | ICD-10-CM

## 2021-05-29 DIAGNOSIS — M7061 Trochanteric bursitis, right hip: Secondary | ICD-10-CM | POA: Diagnosis not present

## 2021-05-29 DIAGNOSIS — M7731 Calcaneal spur, right foot: Secondary | ICD-10-CM

## 2021-05-29 NOTE — Patient Instructions (Signed)
Back Exercises The following exercises strengthen the muscles that help to support the trunk and back. They also help to keep the lower back flexible. Doing these exercises can help to prevent back pain or lessen existing pain. If you have back pain or discomfort, try doing these exercises 2-3 times each day or as told by your health care provider. As your pain improves, do them once each day, but increase the number of times that you repeat the steps for each exercise (do more repetitions). To prevent the recurrence of back pain, continue to do these exercises once each day or as told by your health care provider. Do exercises exactly as told by your health care provider and adjust them as directed. It is normal to feel mild stretching, pulling, tightness, or discomfort as you do these exercises, but you should stop right away if youfeel sudden pain or your pain gets worse. Exercises Single knee to chest Repeat these steps 3-5 times for each leg: Lie on your back on a firm bed or the floor with your legs extended. Bring one knee to your chest. Your other leg should stay extended and in contact with the floor. Hold your knee in place by grabbing your knee or thigh with both hands and hold. Pull on your knee until you feel a gentle stretch in your lower back or buttocks. Hold the stretch for 10-30 seconds. Slowly release and straighten your leg. Pelvic tilt Repeat these steps 5-10 times: Lie on your back on a firm bed or the floor with your legs extended. Bend your knees so they are pointing toward the ceiling and your feet are flat on the floor. Tighten your lower abdominal muscles to press your lower back against the floor. This motion will tilt your pelvis so your tailbone points up toward the ceiling instead of pointing to your feet or the floor. With gentle tension and even breathing, hold this position for 5-10 seconds. Cat-cow Repeat these steps until your lower back becomes more  flexible: Get into a hands-and-knees position on a firm surface. Keep your hands under your shoulders, and keep your knees under your hips. You may place padding under your knees for comfort. Let your head hang down toward your chest. Contract your abdominal muscles and point your tailbone toward the floor so your lower back becomes rounded like the back of a cat. Hold this position for 5 seconds. Slowly lift your head, let your abdominal muscles relax and point your tailbone up toward the ceiling so your back forms a sagging arch like the back of a cow. Hold this position for 5 seconds.  Press-ups Repeat these steps 5-10 times: Lie on your abdomen (face-down) on the floor. Place your palms near your head, about shoulder-width apart. Keeping your back as relaxed as possible and keeping your hips on the floor, slowly straighten your arms to raise the top half of your body and lift your shoulders. Do not use your back muscles to raise your upper torso. You may adjust the placement of your hands to make yourself more comfortable. Hold this position for 5 seconds while you keep your back relaxed. Slowly return to lying flat on the floor.  Bridges Repeat these steps 10 times: Lie on your back on a firm surface. Bend your knees so they are pointing toward the ceiling and your feet are flat on the floor. Your arms should be flat at your sides, next to your body. Tighten your buttocks muscles and lift your   buttocks off the floor until your waist is at almost the same height as your knees. You should feel the muscles working in your buttocks and the back of your thighs. If you do not feel these muscles, slide your feet 1-2 inches farther away from your buttocks. Hold this position for 3-5 seconds. Slowly lower your hips to the starting position, and allow your buttocks muscles to relax completely. If this exercise is too easy, try doing it with your arms crossed over yourchest. Abdominal  crunches Repeat these steps 5-10 times: Lie on your back on a firm bed or the floor with your legs extended. Bend your knees so they are pointing toward the ceiling and your feet are flat on the floor. Cross your arms over your chest. Tip your chin slightly toward your chest without bending your neck. Tighten your abdominal muscles and slowly raise your trunk (torso) high enough to lift your shoulder blades a tiny bit off the floor. Avoid raising your torso higher than that because it can put too much stress on your low back and does not help to strengthen your abdominal muscles. Slowly return to your starting position. Back lifts Repeat these steps 5-10 times: Lie on your abdomen (face-down) with your arms at your sides, and rest your forehead on the floor. Tighten the muscles in your legs and your buttocks. Slowly lift your chest off the floor while you keep your hips pressed to the floor. Keep the back of your head in line with the curve in your back. Your eyes should be looking at the floor. Hold this position for 3-5 seconds. Slowly return to your starting position. Contact a health care provider if: Your back pain or discomfort gets much worse when you do an exercise. Your worsening back pain or discomfort does not lessen within 2 hours after you exercise. If you have any of these problems, stop doing these exercises right away. Do not do them again unless your health care provider says that you can. Get help right away if: You develop sudden, severe back pain. If this happens, stop doing the exercises right away. Do not do them again unless your health care provider says that you can. This information is not intended to replace advice given to you by your health care provider. Make sure you discuss any questions you have with your healthcare provider. Document Revised: 04/08/2019 Document Reviewed: 09/03/2018 Elsevier Patient Education  2022 [employer]  

## 2021-05-29 NOTE — Progress Notes (Unsigned)
No print ambien prescription to reflect dose change and PT referral you advised at patient's appointment. Please review and sign. Thanks!

## 2021-06-12 ENCOUNTER — Other Ambulatory Visit: Payer: Self-pay | Admitting: Physician Assistant

## 2021-06-12 NOTE — Telephone Encounter (Signed)
Next Visit: 11/27/2021  Last Visit: 05/29/2021  Last Fill: 03/06/2021  Dx: Fibromyalgia  Current Dose per office note on 05/29/2021: tizanidine 4 mg a mouth at bedtime as needed for muscle spasms/.  Okay to refill Tizanidine?

## 2021-07-15 NOTE — Progress Notes (Signed)
Cardiology Office Note:    Date:  07/17/2021   ID:  Christine [name], DOB April 09, 1966, MRN [id number]  PCP:  Lance Bosch, NP  Cardiologist:  None  Electrophysiologist:  None   Referring MD: Lance Bosch, NP   Chief Complaint  Patient presents with   Atrial Fibrillation     History of Present Illness:    Christine [name] is a 55 y.o. female with a hx of fibromyalgia, IBS, paroxysmal atrial fibrillation who presents for follow-up.    She was initially seen on 02/23/2020.  She had an ED admission on 02/15/2020 with chest pain and palpitations.  States that for the prior 2 weeks she has been having episodes of tachycardia.  Heart rate has been as high as 200s.  Typically occurs when standing, as has noticed rapid increase in heart rate.  She feels lightheaded during these episodes.  Has also noticed tachycardia when resting.  She feels like her heart is racing.  She does not exercise.  Denies any chest pain or dyspnea.  Given her symptoms, she went to the ED on 02/15/20.  Orthostatics in the ED showed increase in heart rate from 83-115 with standing, no hypotension.  She was given 2 L of IV fluid and orthostatics improved.  Labs unremarkable, Troponins negative x2.  EKG unremarkable.  D-dimer negative.  Reports that since the ED visit, she continued to have symptoms of tachycardia and palpitations.  Labs drawn at PCP visit 02/22/20 showed normal creatinine (0.9), Hemoglobin (15.9), potassium (4.6), LDL 117, A1c 5.5, TSH 3.27, magnesium 2.2.  Drinks 1 cup of coffee daily.  Rare alcohol use.  Uses THC gummy, has stopped to see if symptoms improved, but did not notice any difference.  Father died of MI in 52s.  Brother had MI in 76s.  Smoked in teens, quit years ago.     TTE on 03/13/2020 showed normal LV systolic function, normal RV function, mild mitral regurgitation.  Zio patch x4 days showed no significant arrhythmias.  Lexiscan Myoview on 12/19/2020 showed normal perfusion.  She subsequently had  ED visit on 05/19/2021 with palpitations, found to be in A. fib with RVR.  She spontaneously converted to sinus rhythm.  She was seen in A. fib clinic on 05/24/2021.  Given CHA2DS2-VASc or 1, was not started on anticoagulation.  She was prescribed as needed diltiazem.   Since last clinic visit, she reports she had 1 episode of palpitations, reports she took diltiazem and symptoms resolved.  She reports she has had chest pain during the A. fib episodes, but otherwise denies any chest pain.  Denies any lightheadedness, syncope, or lower extremity edema.  She walks 15 minutes 2-3 times per day.  Reports BP at home has been 120s to 130s over 60s to 70s.    BP Readings from Last 3 Encounters:  07/17/21 (!) 138/100  05/29/21 (!) 161/98  05/24/21 132/86    Wt Readings from Last 3 Encounters:  07/17/21 290 lb (131.5 kg)  05/29/21 290 lb (131.5 kg)  05/24/21 287 lb 6.4 oz (130.4 kg)     Past Medical History:  Diagnosis Date   Allergy    Diverticulitis    Fibromyalgia    POTS (postural orthostatic tachycardia syndrome)    per patient    Past Surgical History:  Procedure Laterality Date   ABDOMINAL HYSTERECTOMY     CESAREAN SECTION     FINGER SURGERY     left ring finger x 2   FRACTURE SURGERY  TUBAL LIGATION      Current Medications: Current Meds  Medication Sig   apixaban (ELIQUIS) 5 MG TABS tablet Take 1 tablet (5 mg total) by mouth 2 (two) times daily.   cholecalciferol (VITAMIN D) 1000 UNITS tablet Take 10,000 Units by mouth at bedtime.    diltiazem (CARDIZEM CD) 120 MG 24 hr capsule Take 1 capsule (120 mg total) by mouth daily.   fish oil-omega-3 fatty acids 1000 MG capsule Take 1 g by mouth 3 (three) times a week.   hydrOXYzine (VISTARIL) 25 MG capsule Take 25 mg by mouth every 6 (six) hours as needed for anxiety.   hyoscyamine (LEVSIN SL) 0.125 MG SL tablet Place 0.125 mg under the tongue every 4 (four) hours as needed for cramping.    MAGNESIUM PO Take 1 tablet by mouth at  bedtime.   methocarbamol (ROBAXIN) 500 MG tablet Take 1 tablet (500 mg total) by mouth 2 (two) times daily as needed for muscle spasms.   tiZANidine (ZANAFLEX) 4 MG tablet TAKE ONE TABLET BY MOUTH AT BEDTIME AS NEEDED FOR MUSCLE SPASM   [DISCONTINUED] diltiazem (CARDIZEM) 30 MG tablet Take 1 tablet (30 mg total) by mouth every 6 (six) hours as needed (palpitations).     Allergies:   Other, Penicillins, and Sulfa antibiotics   Social History   Socioeconomic History   Marital status: Married    Spouse name: Not on file   Number of children: Not on file   Years of education: Not on file   Highest education level: Not on file  Occupational History   Occupation: Agricultural engineer: [employer]  Tobacco Use   Smoking status: Former    Packs/day: 0.25    Years: 2.00    Pack years: 0.50    Types: Cigarettes    Quit date: 02/22/1988    Years since quitting: 33.4   Smokeless tobacco: Never  Vaping Use   Vaping Use: Never used  Substance and Sexual Activity   Alcohol use: Yes    Alcohol/week: 1.0 standard drink    Types: 1 Glasses of wine per week    Comment: monthly   Drug use: No   Sexual activity: Yes    Birth control/protection: Surgical  Other Topics Concern   Not on file  Social History Narrative   Not on file   Social Determinants of Health   Financial Resource Strain: Not on file  Food Insecurity: Not on file  Transportation Needs: Not on file  Physical Activity: Not on file  Stress: Not on file  Social Connections: Not on file     Family History: The patient's family history includes Asthma in her mother; Heart Problems in her father; Heart disease in her brother; Scoliosis in her son.  ROS:   Please see the history of present illness.     All other systems reviewed and are negative.  EKGs/Labs/Other Studies Reviewed:    The following studies were reviewed today:   EKG:  EKG is  ordered today.  The ekg ordered  11/20/20 shows sinus rhythm, rate  72, low voltage, no ST/T abnormalities.  Recent Labs: 05/19/2021: ALT 29; BUN 7; Creatinine, Ser 0.82; Hemoglobin 15.8; Platelets 368; Potassium 4.3; Sodium 140  Recent Lipid Panel No results found for: CHOL, TRIG, HDL, CHOLHDL, VLDL, LDLCALC, LDLDIRECT  Physical Exam:    VS:  BP (!) 138/100 (BP Location: Right Arm)   Pulse 81   Ht 5\' 3"  (1.6 m)   Wt 290  lb (131.5 kg)   LMP 04/02/2006   SpO2 99%   BMI 51.37 kg/m     Wt Readings from Last 3 Encounters:  07/17/21 290 lb (131.5 kg)  05/29/21 290 lb (131.5 kg)  05/24/21 287 lb 6.4 oz (130.4 kg)     GEN:  Well nourished, well developed in no acute distress HEENT: Normal NECK: No JVD; No carotid bruits CARDIAC: RRR, no murmurs, rubs, gallops RESPIRATORY:  Clear to auscultation without rales, wheezing or rhonchi  ABDOMEN: Soft, non-tender, non-distended MUSCULOSKELETAL:  No edema; No deformity  SKIN: Warm and dry NEUROLOGIC:  Alert and oriented x 3 PSYCHIATRIC:  Normal affect   ASSESSMENT:    1. Paroxysmal atrial fibrillation (HCC)   2. Snoring   3. Elevated BP without diagnosis of hypertension   4. Morbid obesity (HCC)   5. Lipid screening   6. Diabetes mellitus screening     PLAN:    Paroxysmal atrial fibrillation: ED visit for palpitations 05/2021, found to be in A. fib with RVR.  Converted to sinus rhythm spontaneously.  CHA2DS2-VASc 1-2 (female, +/-HTN) -Rate in 140s during recent ED visit, and BP elevated in clinic today, switch from as needed diltiazem to diltiazem 120 mg daily.  Will check Zio patch x2 weeks to evaluate A. fib burden -BP elevated in clinic today, was also elevated at recent rheumatology appointment, suspect undiagnosed hypertension.  Her CHA2DS2-VASc score would then be 2, and anticoagulation recommended.  Discussed risks and benefits of anticoagulation, she is agreeable to starting.  Will start Eliquis 5 mg twice daily -Echocardiogram -Sleep study  Elevated BP: BP 138/100 in clinic today, was  161/98 at recent appointment at rheumatology.  Suspect undiagnosed hypertension.  Recommend starting diltiazem 120 mg daily as above.  Asked patient to check BP daily for next 2 weeks and call with results.  Chest pain: Reports episode of chest pain occurring with stress.  Lexiscan Myoview on 12/19/2020 showed normal perfusion.  Morbid obesity: Body mass index is 51.37 kg/m.  Diet/exercise recommended.  Discussed referral to healthy weight and wellness, she would like to hold off at this time and work on weight loss on her own.  Snoring: Check sleep study  Lipid screening: check lipid panel  DM screening: check A1c  RTC in 6 months    Medication Adjustments/Labs and Tests Ordered: Current medicines are reviewed at length with the patient today.  Concerns regarding medicines are outlined above.  Orders Placed This Encounter  Procedures   TSH   Lipid panel   Basic metabolic panel   CBC   Hemoglobin A1c   LONG TERM MONITOR (3-14 DAYS)   ECHOCARDIOGRAM COMPLETE   Split night study    Meds ordered this encounter  Medications   diltiazem (CARDIZEM CD) 120 MG 24 hr capsule    Sig: Take 1 capsule (120 mg total) by mouth daily.    Dispense:  30 capsule    Refill:  5   apixaban (ELIQUIS) 5 MG TABS tablet    Sig: Take 1 tablet (5 mg total) by mouth 2 (two) times daily.    Dispense:  60 tablet    Refill:  5     Patient Instructions  Medication Instructions:  STOP: DILTIAZEM SHORT ACTING START: DILTIAZEM LONG ACTING  DAILY  START: ELIQUIS  TWICE DAILY  *If you need a refill on your cardiac medications before your next appointment, please call your pharmacy*  Lab Work: TSH, CBC, A1C, BMET, LIPID- THIS WILL NEED TO BE DONE AFTER  FASTING If you have labs (blood work) drawn today and your tests are completely normal, you will receive your results only by: MyChart Message (if you have MyChart) OR A paper copy in the mail If you have any lab test that is abnormal or we need  to change your treatment, we will call you to review the results.  Testing/Procedures: Your physician has requested that you have an echocardiogram. Echocardiography is a painless test that uses sound waves to create images of your heart. It provides your doctor with information about the size and shape of your heart and how well your heart's chambers and valves are working. You may receive an ultrasound enhancing agent through an IV if needed to better visualize your heart during the echo.This procedure takes approximately one hour. There are no restrictions for this procedure. This will take place at the [address], [address].    ZIO XT- Long Term Monitor Instructions   Your physician has requested you wear your ZIO patch monitor 14 days.   This is a single patch monitor.  [employer] supplies one patch monitor per enrollment.  Additional stickers are not available.   Please do not apply patch if you will be having a Nuclear Stress Test, Echocardiogram, Cardiac CT, MRI, or Chest Xray during the time frame you would be wearing the monitor. The patch cannot be worn during these tests.  You cannot remove and re-apply the ZIO XT patch monitor.   Your ZIO patch monitor will be sent USPS Priority mail from [facility] directly to your home address. The monitor may also be mailed to a PO BOX if home delivery is not available.   It may take 3-5 days to receive your monitor after you have been enrolled.   Once you have received you monitor, please review enclosed instructions.  Your monitor has already been registered assigning a specific monitor serial # to you.   Applying the monitor   Shave hair from upper left chest.   Hold abrader disc by orange tab.  Rub abrader in 40 strokes over left upper chest as indicated in your monitor instructions.   Clean area with 4 enclosed alcohol pads .  Use all pads to assure are is cleaned thoroughly.  Let dry.   Apply patch as indicated in monitor  instructions.  Patch will be place under collarbone on left side of chest with arrow pointing upward.   Rub patch adhesive wings for 2 minutes.Remove white label marked "1".  Remove white label marked "2".  Rub patch adhesive wings for 2 additional minutes.   While looking in a mirror, press and release button in center of patch.  A small green light will flash 3-4 times .  This will be your only indicator the monitor has been turned on.     Do not shower for the first 24 hours.  You may shower after the first 24 hours.   Press button if you feel a symptom. You will hear a small click.  Record Date, Time and Symptom in the Patient Log Book.   When you are ready to remove patch, follow instructions on last 2 pages of Patient Log Book.  Stick patch monitor onto last page of Patient Log Book.   Place Patient Log Book in [place] box.  Use locking tab on box and tape box closed securely.  The [employer] and [employer] has [employer] on it.  Please place in mailbox as soon as possible.  Your physician should have your test results approximately 7 days after the monitor has [facility].   Call [facility] Customer Care at [phone] if you have questions regarding your ZIO XT patch monitor.  Call them immediately if you see an orange light blinking on your monitor.   If your monitor falls off in less than 4 days contact our Monitor department at [phone].  If your monitor becomes loose or falls off after 4 days call [employer] at [phone] for suggestions on securing your monitor.   Your physician has recommended that you have a sleep study. This test records several body functions during sleep, including: brain activity, eye movement, oxygen and carbon dioxide blood levels, heart rate and rhythm, breathing rate and rhythm, the flow of air through your mouth and nose, snoring, body muscle movements, and chest and belly movement. SOMEONE WILL REACH OUT TO YOU TO SCHEDULE  THIS   Follow-Up: At [facility], you and your health needs are our priority.  As part of our continuing mission to provide you with exceptional heart care, we have created designated Provider Care Teams.  These Care Teams include your primary Cardiologist (physician) and Advanced Practice Providers (APPs -  Physician Assistants and Nurse Practitioners) who all work together to provide you with the care you need, when you need it.  Your next appointment:   6 month(s)  The format for your next appointment:   In Person  Provider:   Epifanio Lescheshristopher Benno Brensinger, MD  Other Instructions Please check your blood pressure at home daily, write it down.  Call the office or send message via Mychart with the readings in 2 weeks for Dr. Bjorn PippinSchumann to review.     Signed, [place] Ish[place]awahristopher L Kassidie Hendriks, MD  07/17/2021 4:40 PM    [facility]

## 2021-07-17 ENCOUNTER — Encounter: Payer: Self-pay | Admitting: Cardiology

## 2021-07-17 ENCOUNTER — Ambulatory Visit (INDEPENDENT_AMBULATORY_CARE_PROVIDER_SITE_OTHER): Payer: No Typology Code available for payment source

## 2021-07-17 ENCOUNTER — Other Ambulatory Visit: Payer: Self-pay

## 2021-07-17 ENCOUNTER — Ambulatory Visit: Payer: No Typology Code available for payment source | Admitting: Cardiology

## 2021-07-17 VITALS — BP 138/100 | HR 81 | Ht 63.0 in | Wt 290.0 lb

## 2021-07-17 DIAGNOSIS — I480 Paroxysmal atrial fibrillation: Secondary | ICD-10-CM

## 2021-07-17 DIAGNOSIS — Z131 Encounter for screening for diabetes mellitus: Secondary | ICD-10-CM

## 2021-07-17 DIAGNOSIS — R030 Elevated blood-pressure reading, without diagnosis of hypertension: Secondary | ICD-10-CM | POA: Diagnosis not present

## 2021-07-17 DIAGNOSIS — R0683 Snoring: Secondary | ICD-10-CM

## 2021-07-17 DIAGNOSIS — Z13220 Encounter for screening for lipoid disorders: Secondary | ICD-10-CM

## 2021-07-17 MED ORDER — APIXABAN 5 MG PO TABS: 5.0000 mg | ORAL_TABLET | Freq: Two times a day (BID) | ORAL | 5 refills | Status: DC

## 2021-07-17 MED ORDER — DILTIAZEM HCL ER COATED BEADS 120 MG PO CP24: 120.0000 mg | ORAL_CAPSULE | Freq: Every day | ORAL | 5 refills | Status: DC

## 2021-07-17 NOTE — Patient Instructions (Signed)
Medication Instructions:  STOP: DILTIAZEM SHORT ACTING START: DILTIAZEM LONG ACTING 120mg  DAILY  START: ELIQUIS 5mg  TWICE DAILY  *If you need a refill on your cardiac medications before your next appointment, please call your pharmacy*  Lab Work: TSH, CBC, A1C, BMET, LIPID- THIS WILL NEED TO BE DONE AFTER FASTING If you have labs (blood work) drawn today and your tests are completely normal, you will receive your results only by: MyChart Message (if you have MyChart) OR A paper copy in the mail If you have any lab test that is abnormal or we need to change your treatment, we will call you to review the results.  Testing/Procedures: Your physician has requested that you have an echocardiogram. Echocardiography is a painless test that uses sound waves to create images of your heart. It provides your doctor with information about the size and shape of your heart and how well your heart's chambers and valves are working. You may receive an ultrasound enhancing agent through an IV if needed to better visualize your heart during the echo.This procedure takes approximately one hour. There are no restrictions for this procedure. This will take place at the [address], [address].    ZIO XT- Long Term Monitor Instructions   Your physician has requested you wear your ZIO patch monitor 14 days.   This is a single patch monitor.  [employer] supplies one patch monitor per enrollment.  Additional stickers are not available.   Please do not apply patch if you will be having a Nuclear Stress Test, Echocardiogram, Cardiac CT, MRI, or Chest Xray during the time frame you would be wearing the monitor. The patch cannot be worn during these tests.  You cannot remove and re-apply the ZIO XT patch monitor.   Your ZIO patch monitor will be sent USPS Priority mail from [facility] directly to your home address. The monitor may also be mailed to a PO BOX if home delivery is not available.   It may take  3-5 days to receive your monitor after you have been enrolled.   Once you have received you monitor, please review enclosed instructions.  Your monitor has already been registered assigning a specific monitor serial # to you.   Applying the monitor   Shave hair from upper left chest.   Hold abrader disc by orange tab.  Rub abrader in 40 strokes over left upper chest as indicated in your monitor instructions.   Clean area with 4 enclosed alcohol pads .  Use all pads to assure are is cleaned thoroughly.  Let dry.   Apply patch as indicated in monitor instructions.  Patch will be place under collarbone on left side of chest with arrow pointing upward.   Rub patch adhesive wings for 2 minutes.Remove white label marked "1".  Remove white label marked "2".  Rub patch adhesive wings for 2 additional minutes.   While looking in a mirror, press and release button in center of patch.  A small green light will flash 3-4 times .  This will be your only indicator the monitor has been turned on.     Do not shower for the first 24 hours.  You may shower after the first 24 hours.   Press button if you feel a symptom. You will hear a small click.  Record Date, Time and Symptom in the Patient Log Book.   When you are ready to remove patch, follow instructions on last 2 pages of Patient Log Book.  Stick patch monitor onto last page of Patient Log Book.   Place Patient Log Book in [place] box.  Use locking tab on box and tape box closed securely.  The [employer] has [employer] on it.  Please place in mailbox as soon as possible.  Your physician should have your test results approximately 7 days after the monitor has been mailed back to [facility].   Call [facility] Customer Care at [phone] if you have questions regarding your ZIO XT patch monitor.  Call them immediately if you see an orange light blinking on your monitor.   If your monitor falls off in less than 4 days contact our  Monitor department at [phone].  If your monitor becomes loose or falls off after 4 days call [employer] at [phone] for suggestions on securing your monitor.   Your physician has recommended that you have a sleep study. This test records several body functions during sleep, including: brain activity, eye movement, oxygen and carbon dioxide blood levels, heart rate and rhythm, breathing rate and rhythm, the flow of air through your mouth and nose, snoring, body muscle movements, and chest and belly movement. SOMEONE WILL REACH OUT TO YOU TO SCHEDULE THIS   Follow-Up: At [facility], you and your health needs are our priority.  As part of our continuing mission to provide you with exceptional heart care, we have created designated Provider Care Teams.  These Care Teams include your primary Cardiologist (physician) and Advanced Practice Providers (APPs -  Physician Assistants and Nurse Practitioners) who all work together to provide you with the care you need, when you need it.  Your next appointment:   6 month(s)  The format for your next appointment:   In Person  Provider:   Epifanio Lesches, MD  Other Instructions Please check your blood pressure at home daily, write it down.  Call the office or send message via Mychart with the readings in 2 weeks for Dr. Bjorn Pippin to review.

## 2021-07-17 NOTE — Progress Notes (Unsigned)
Enrolled patient for a 14 day Zio XT  monitor to be mailed to patients home  °

## 2021-07-24 ENCOUNTER — Telehealth: Payer: Self-pay | Admitting: *Deleted

## 2021-07-24 NOTE — Telephone Encounter (Signed)
Prior Authorization for split night sleep study sent to [facility] via web portal. Tracking Number [id number].

## 2021-07-24 NOTE — Telephone Encounter (Signed)
-----   Message from Baird Cancer, RN sent at 07/17/2021  9:01 AM EDT ----- Regarding: Split night ordered Patient seen in clinic today by Dr. Bjorn Pippin- Split night sleep study ordered, [place] has been done and is in chart.   Thank you  Jenna B. RN

## 2021-07-27 NOTE — Telephone Encounter (Signed)
Received a request from [facility] to fax clinicals. Pending Auth # was different. [id number]. Clinicals faxed to [phone].

## 2021-08-01 NOTE — Telephone Encounter (Signed)
Received PA from [facility]  for split night sleep study. Patient notified of appointment details. Auth # [id number]. Valid dates 08/15/21 to 10/15/21.

## 2021-08-06 ENCOUNTER — Other Ambulatory Visit: Payer: Self-pay

## 2021-08-06 ENCOUNTER — Ambulatory Visit (HOSPITAL_COMMUNITY): Payer: No Typology Code available for payment source | Attending: Cardiology

## 2021-08-06 DIAGNOSIS — I480 Paroxysmal atrial fibrillation: Secondary | ICD-10-CM | POA: Insufficient documentation

## 2021-08-06 LAB — ECHOCARDIOGRAM COMPLETE
Area-P 1/2: 6.12 cm2
S' Lateral: 2 cm

## 2021-08-06 MED ORDER — PERFLUTREN LIPID MICROSPHERE: 1.0000 mL | INTRAVENOUS | Status: AC | PRN

## 2021-08-06 NOTE — Progress Notes (Unsigned)
Although patient consented to the administration of Definity, we were unable to obtain IV access.

## 2021-08-31 NOTE — Telephone Encounter (Signed)
Heart monitor looked good, no significant abnormalities

## 2021-09-10 ENCOUNTER — Other Ambulatory Visit: Payer: Self-pay | Admitting: Rheumatology

## 2021-09-10 ENCOUNTER — Other Ambulatory Visit: Payer: Self-pay | Admitting: Physician Assistant

## 2021-09-10 NOTE — Telephone Encounter (Signed)
Next Visit: 11/27/2021   Last Visit: 05/29/2021   Last Fill: 03/06/2021  Dx: Primary insomnia    Current Dose per office note on 05/29/2021: Ambien to 5 mg p.o. nightly.  Okay to refill Ambien?

## 2021-09-10 NOTE — Telephone Encounter (Signed)
Next Visit: 11/27/2021   Last Visit: 05/29/2021   Last Fill: 06/12/2021   Dx: Fibromyalgia   Current Dose per office note on 05/29/2021: tizanidine 4 mg a mouth at bedtime as needed for muscle spasms.   Okay to refill Tizanidine?

## 2021-09-27 ENCOUNTER — Ambulatory Visit (HOSPITAL_BASED_OUTPATIENT_CLINIC_OR_DEPARTMENT_OTHER): Payer: No Typology Code available for payment source | Attending: Cardiology | Admitting: Cardiovascular Disease

## 2021-09-27 ENCOUNTER — Other Ambulatory Visit: Payer: Self-pay

## 2021-09-27 DIAGNOSIS — G4733 Obstructive sleep apnea (adult) (pediatric): Secondary | ICD-10-CM | POA: Diagnosis not present

## 2021-09-27 DIAGNOSIS — Z79899 Other long term (current) drug therapy: Secondary | ICD-10-CM | POA: Diagnosis not present

## 2021-09-27 DIAGNOSIS — I480 Paroxysmal atrial fibrillation: Secondary | ICD-10-CM

## 2021-09-27 DIAGNOSIS — R0683 Snoring: Secondary | ICD-10-CM | POA: Insufficient documentation

## 2021-09-27 DIAGNOSIS — Z7901 Long term (current) use of anticoagulants: Secondary | ICD-10-CM | POA: Diagnosis not present

## 2021-09-28 ENCOUNTER — Other Ambulatory Visit (HOSPITAL_BASED_OUTPATIENT_CLINIC_OR_DEPARTMENT_OTHER): Payer: Self-pay

## 2021-09-28 DIAGNOSIS — R0683 Snoring: Secondary | ICD-10-CM

## 2021-09-28 DIAGNOSIS — I480 Paroxysmal atrial fibrillation: Secondary | ICD-10-CM

## 2021-10-21 ENCOUNTER — Encounter (HOSPITAL_BASED_OUTPATIENT_CLINIC_OR_DEPARTMENT_OTHER): Payer: Self-pay | Admitting: Cardiovascular Disease

## 2021-10-21 NOTE — Procedures (Signed)
Patient Name: Christine [name], Christine [name] Date: 09/27/2021 Gender: Female D.O.B: 06-07-1966 Age (years): 54 Referring Provider: Epifanio Lesches Height (inches): 63 Interpreting Physician: Nicki Guadalajara MD, ABSM Weight (lbs): 290 RPSGT: Shelah Lewandowsky BMI: 51 MRN: [id number] Neck Size: 15.00  CLINICAL INFORMATION Sleep Study Type: NPSG  Indication for sleep study: Fatigue, Hypertension, Obesity  Epworth Sleepiness Score: 0  SLEEP STUDY TECHNIQUE As per the AASM Manual for the Scoring of Sleep and Associated Events v2.3 (April 2016) with a hypopnea requiring 4% desaturations.  The channels recorded and monitored were frontal, central and occipital EEG, electrooculogram (EOG), submentalis EMG (chin), nasal and oral airflow, thoracic and abdominal wall motion, anterior tibialis EMG, snore microphone, electrocardiogram, and pulse oximetry.  MEDICATIONS zolpidem (AMBIEN) 5 MG tablet apixaban (ELIQUIS) 5 MG TABS tablet cholecalciferol (VITAMIN D) 1000 UNITS tablet diltiazem (CARDIZEM CD) 120 MG 24 hr capsule fish oil-omega-3 fatty acids 1000 MG capsule hydrOXYzine (VISTARIL) 25 MG capsule hyoscyamine (LEVSIN SL) 0.125 MG SL tablet MAGNESIUM PO methocarbamol (ROBAXIN) 500 MG tablet tiZANidine (ZANAFLEX) 4 MG tablet Medications self-administered by patient taken the night of the study : TIZANIDINE, PANTOPRAZOLE SODIUM, Eliquis, Magnesium Malate, TYLENOL ARTHRITIS  SLEEP ARCHITECTURE The study was initiated at 10:25:22 PM and ended at 4:50:58 AM.  Sleep onset time was 14.9 minutes and the sleep efficiency was 64.6%%. The total sleep time was 249 minutes.  Stage REM latency was 260.5 minutes.  The patient spent 15.3%% of the night in stage N1 sleep, 64.3%% in stage N2 sleep, 0.2%% in stage N3 and 20.3% in REM.  Alpha intrusion was absent.  Supine sleep was 10.04%.  RESPIRATORY PARAMETERS The overall apnea/hypopnea index (AHI) was 5.1 per hour. The respiratory  disturbance index (RDI) was 21.0/h There were 3 total apneas, including 1 obstructive, 2 central and 0 mixed apneas. There were 18 hypopneas and 66 RERAs.  The AHI during Stage REM sleep was 10.7 per hour.  AHI while supine was 4.8 per hour.  The mean oxygen saturation was 94.0%. The minimum SpO2 during sleep was 86.0%.  Mild snoring was noted during this study.  CARDIAC DATA The 2 lead EKG demonstrated sinus rhythm. The mean heart rate was 65.5 beats per minute. Other EKG findings include: None.  LEG MOVEMENT DATA The total PLMS were 0 with a resulting PLMS index of 0.0. Associated arousal with leg movement index was 0.0 .  IMPRESSIONS - Mild obstructive sleep apnea occurred during this study (AHI  5.1/h; RDI 21.0/h); events were slightly worse with REM sleep (AHI 10.7/h). - No significant central sleep apnea occurred during this study (CAI  0.5/h). - Mild oxygen desaturation to a nadir of 86.0%. - Reduced sleep efficiency at 64.6%. Wake after sleep onset (WASO) was 121.7 minutes. -  Mild snoring. - No cardiac abnormalities were noted during this study. - Clinically significant periodic limb movements did not occur during sleep. No significant associated arousals.  DIAGNOSIS - Obstructive Sleep Apnea (G47.33) - Nocturnal Hypoxemia (G47.36)  RECOMMENDATIONS -  In this patient with cardiovascular comorbidities including atrial fibrillation, consider therapeutic CPAP for treatment of her mild sleep disordered breathing. Recommend an initial trial of Auto-PAP at 6 - 15 cm of water. - Effort should be made to optimize nasal and oropharyngeal patency. - Alternatives to CPAP therapy such as a customized oral appliance can be considered.  - Avoid alcohol, sedatives and other CNS depressants that may worsen sleep apnea and disrupt normal sleep architecture. - Sleep hygiene should be reviewed to assess factors that may  improve sleep quality. - Weight management (BMI 51) and regular exercise  should be initiated or continued if appropriate.  [Electronically signed] 10/21/2021 06:25 PM  Shelva Majestic MD, [facility], ABSM Diplomate, American Board of Sleep Medicine   NPI: [id number] [id number] PH: [phone]   FX: [phone] [place]

## 2021-10-25 ENCOUNTER — Telehealth: Payer: Self-pay | Admitting: *Deleted

## 2021-10-25 NOTE — Telephone Encounter (Signed)
Patient notified of sleep study results and recommendations. She decided to move forward with APAP. She states that her husband uses CPAP therefore she is familiar with it. She was made aware of the machine back order situation. Order will be sent to [facility].

## 2021-10-25 NOTE — Telephone Encounter (Signed)
-----   Message from Nicki Guadalajara A sent at 10/21/2021  6:32 PM EST ----- Christine [name], please notify pt of results. Recommend Auto-PAP unless is against CPAP initiation, then consider oral appliance

## 2021-11-13 NOTE — Progress Notes (Deleted)
Office Visit Note  Patient: Christine [name]             Date of Birth: 04-04-1966           MRN: [id number]             PCP: Lance Bosch, NP Referring: Lance Bosch, NP Visit Date: 11/27/2021 Occupation: @GUAROCC @  Subjective:    History of Present Illness: Christine [name] is a 55 y.o. female with history of DDD and osteoarthritis. She is taking robaxin 500 mg 1 tablet by mouth twice daily as needed and zanaflex 4 mg at bedtime as needed for muscle spasms.  She takes ambien 5 mg at bedtime for insomnia.   Activities of Daily Living:  Patient reports morning stiffness for *** {minute/hour:19697}.   Patient {ACTIONS;DENIES/REPORTS:21021675::"Denies"} nocturnal pain.  Difficulty dressing/grooming: {ACTIONS;DENIES/REPORTS:21021675::"Denies"} Difficulty climbing stairs: {ACTIONS;DENIES/REPORTS:21021675::"Denies"} Difficulty getting out of chair: {ACTIONS;DENIES/REPORTS:21021675::"Denies"} Difficulty using hands for taps, buttons, cutlery, and/or writing: {ACTIONS;DENIES/REPORTS:21021675::"Denies"}  No Rheumatology ROS completed.   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   Trochanteric bursitis of both hips 10/18/2016   Primary osteoarthritis of both knees 10/18/2016   Chondromalacia, patella, unspecified laterality 10/18/2016   Bilateral calcaneal spurs 10/18/2016   Vitamin D deficiency 10/18/2016   UTI (urinary tract infection) 11/08/2014   Allergic rhinitis 02/21/2013   Migraine 02/21/2013    Past Medical History:  Diagnosis Date   Allergy    Diverticulitis    Fibromyalgia    POTS (postural orthostatic tachycardia syndrome)    per patient    Family History  Problem Relation Age of Onset    Asthma Mother    Heart Problems Father    Heart disease Brother    Scoliosis Son    Past Surgical History:  Procedure Laterality Date   ABDOMINAL HYSTERECTOMY     CESAREAN SECTION     FINGER SURGERY     left ring finger x 2   FRACTURE SURGERY     TUBAL LIGATION     Social History   Social History Narrative   Not on file   Immunization History  Administered Date(s) Administered   PFIZER(Purple Top)SARS-COV-2 Vaccination 03/12/2020, 04/02/2020   Tdap 06/28/2018     Objective: Vital Signs: LMP 04/02/2006    Physical Exam Vitals and nursing note reviewed.  Constitutional:      Appearance: She is well-developed.  HENT:     Head: Normocephalic and atraumatic.  Eyes:     Conjunctiva/sclera: Conjunctivae normal.  Pulmonary:     Effort: Pulmonary effort is normal.  Abdominal:     Palpations: Abdomen is soft.  Musculoskeletal:     Cervical back: Normal range of motion.  Skin:    General: Skin is warm and dry.     Capillary Refill: Capillary refill takes less than 2 seconds.  Neurological:     Mental Status: She is alert and oriented to person, place, and time.  Psychiatric:        Behavior: Behavior normal.     Musculoskeletal Exam: ***  CDAI Exam: CDAI Score: -- Patient Global: --; Provider Global: -- Swollen: --; Tender: -- Joint Exam 11/27/2021   No joint exam has been documented for this visit   There is currently no information documented on  the homunculus. Go to the Rheumatology activity and complete the homunculus joint exam.  Investigation: No additional findings.  Imaging: No results found.  Recent Labs: Lab Results  Component Value Date   WBC 7.0 05/19/2021   HGB 15.8 (H) 05/19/2021   PLT 368 05/19/2021   NA 140 05/19/2021   K 4.3 05/19/2021   CL 103 05/19/2021   CO2 25 05/19/2021   GLUCOSE 98 05/19/2021   BUN 7 05/19/2021   CREATININE 0.82 05/19/2021   BILITOT 1.1 05/19/2021   ALKPHOS 59 05/19/2021   AST 28 05/19/2021   ALT 29  05/19/2021   PROT 7.3 05/19/2021   ALBUMIN 4.0 05/19/2021   CALCIUM 10.0 05/19/2021   GFRAA >60 02/15/2020    Speciality Comments: No specialty comments available.  Procedures:  No procedures performed Allergies: Other, Penicillins, and Sulfa antibiotics   Assessment / Plan:     Visit Diagnoses: No diagnosis found.  Orders: No orders of the defined types were placed in this encounter.  No orders of the defined types were placed in this encounter.   Face-to-face time spent with patient was *** minutes. Greater than 50% of time was spent in counseling and coordination of care.  Follow-Up Instructions: No follow-ups on file.   Earnestine Mealing, CMA  Note - This record has been created using Editor, commissioning.  Chart creation errors have been sought, but may not always  have been located. Such creation errors do not reflect on  the standard of medical care.

## 2021-11-27 ENCOUNTER — Ambulatory Visit: Payer: No Typology Code available for payment source | Admitting: Physician Assistant

## 2021-11-27 DIAGNOSIS — M5136 Other intervertebral disc degeneration, lumbar region: Secondary | ICD-10-CM

## 2021-11-27 DIAGNOSIS — Z8669 Personal history of other diseases of the nervous system and sense organs: Secondary | ICD-10-CM

## 2021-11-27 DIAGNOSIS — M7061 Trochanteric bursitis, right hip: Secondary | ICD-10-CM

## 2021-11-27 DIAGNOSIS — G90A Postural orthostatic tachycardia syndrome (POTS): Secondary | ICD-10-CM

## 2021-11-27 DIAGNOSIS — R5383 Other fatigue: Secondary | ICD-10-CM

## 2021-11-27 DIAGNOSIS — Z8719 Personal history of other diseases of the digestive system: Secondary | ICD-10-CM

## 2021-11-27 DIAGNOSIS — E559 Vitamin D deficiency, unspecified: Secondary | ICD-10-CM

## 2021-11-27 DIAGNOSIS — F5101 Primary insomnia: Secondary | ICD-10-CM

## 2021-11-27 DIAGNOSIS — M7731 Calcaneal spur, right foot: Secondary | ICD-10-CM

## 2021-11-27 DIAGNOSIS — M170 Bilateral primary osteoarthritis of knee: Secondary | ICD-10-CM

## 2021-11-27 DIAGNOSIS — M797 Fibromyalgia: Secondary | ICD-10-CM

## 2022-01-20 NOTE — Progress Notes (Deleted)
Cardiology Office Note:    Date:  01/20/2022   ID:  Christine [name], DOB July 24, 1966, MRN [id number]  PCP:  Lance Bosch, NP  Cardiologist:  None  Electrophysiologist:  None   Referring MD: Lance Bosch, NP   No chief complaint on file.    History of Present Illness:    Christine [name] is a 56 y.o. female with a hx of fibromyalgia, IBS, paroxysmal atrial fibrillation who presents for follow-up.    She was initially seen on 02/23/2020.  She had an ED admission on 02/15/2020 with chest pain and palpitations.  States that for the prior 2 weeks she has been having episodes of tachycardia.  Heart rate has been as high as 200s.  Typically occurs when standing, as has noticed rapid increase in heart rate.  She feels lightheaded during these episodes.  Has also noticed tachycardia when resting.  She feels like her heart is racing.  She does not exercise.  Denies any chest pain or dyspnea.  Given her symptoms, she went to the ED on 02/15/20.  Orthostatics in the ED showed increase in heart rate from 83-115 with standing, no hypotension.  She was given 2 L of IV fluid and orthostatics improved.  Labs unremarkable, Troponins negative x2.  EKG unremarkable.  D-dimer negative.  Reports that since the ED visit, she continued to have symptoms of tachycardia and palpitations.  Labs drawn at PCP visit 02/22/20 showed normal creatinine (0.9), Hemoglobin (15.9), potassium (4.6), LDL 117, A1c 5.5, TSH 3.27, magnesium 2.2.  Drinks 1 cup of coffee daily.  Rare alcohol use.  Uses THC gummy, has stopped to see if symptoms improved, but did not notice any difference.  Father died of MI in 9s.  Brother had MI in 44s.  Smoked in teens, quit years ago.     TTE on 03/13/2020 showed normal LV systolic function, normal RV function, mild mitral regurgitation.  Zio patch x4 days showed no significant arrhythmias.  Lexiscan Myoview on 12/19/2020 showed normal perfusion.  She subsequently had ED visit on 05/19/2021 with  palpitations, found to be in A. fib with RVR.  She spontaneously converted to sinus rhythm.  Zio patch x14 days on 08/13/2021 showed 13 runs of SVT with longest lasting 17 beats, no significant arrhythmias.  Echocardiogram 08/06/2021 showed hyperdynamic LV systolic function, normal RV function, no significant valvular disease.   Since last clinic visit,   she reports she had 1 episode of palpitations, reports she took diltiazem and symptoms resolved.  She reports she has had chest pain during the A. fib episodes, but otherwise denies any chest pain.  Denies any lightheadedness, syncope, or lower extremity edema.  She walks 15 minutes 2-3 times per day.  Reports BP at home has been 120s to 130s over 60s to 70s.    BP Readings from Last 3 Encounters:  07/17/21 (!) 138/100  05/29/21 (!) 161/98  05/24/21 132/86    Wt Readings from Last 3 Encounters:  09/27/21 290 lb (131.5 kg)  07/17/21 290 lb (131.5 kg)  05/29/21 290 lb (131.5 kg)     Past Medical History:  Diagnosis Date   Allergy    Diverticulitis    Fibromyalgia    POTS (postural orthostatic tachycardia syndrome)    per patient    Past Surgical History:  Procedure Laterality Date   ABDOMINAL HYSTERECTOMY     CESAREAN SECTION     FINGER SURGERY     left ring finger x 2   FRACTURE SURGERY  TUBAL LIGATION      Current Medications: No outpatient medications have been marked as taking for the 01/21/22 encounter (Appointment) with Little Ishikawa, MD.     Allergies:   Other, Penicillins, and Sulfa antibiotics   Social History   Socioeconomic History   Marital status: Married    Spouse name: Not on file   Number of children: Not on file   Years of education: Not on file   Highest education level: Not on file  Occupational History   Occupation: Agricultural engineer: [employer]  Tobacco Use   Smoking status: Former    Packs/day: 0.25    Years: 2.00    Pack years: 0.50    Types: Cigarettes    Quit  date: 02/22/1988    Years since quitting: 33.9   Smokeless tobacco: Never  Vaping Use   Vaping Use: Never used  Substance and Sexual Activity   Alcohol use: Yes    Alcohol/week: 1.0 standard drink    Types: 1 Glasses of wine per week    Comment: monthly   Drug use: No   Sexual activity: Yes    Birth control/protection: Surgical  Other Topics Concern   Not on file  Social History Narrative   Not on file   Social Determinants of Health   Financial Resource Strain: Not on file  Food Insecurity: Not on file  Transportation Needs: Not on file  Physical Activity: Not on file  Stress: Not on file  Social Connections: Not on file     Family History: The patient's family history includes Asthma in her mother; Heart Problems in her father; Heart disease in her brother; Scoliosis in her son.  ROS:   Please see the history of present illness.     All other systems reviewed and are negative.  EKGs/Labs/Other Studies Reviewed:    The following studies were reviewed today:   EKG:  EKG is  ordered today.  The ekg ordered  11/20/20 shows sinus rhythm, rate 72, low voltage, no ST/T abnormalities.  Recent Labs: 05/19/2021: ALT 29; BUN 7; Creatinine, Ser 0.82; Hemoglobin 15.8; Platelets 368; Potassium 4.3; Sodium 140  Recent Lipid Panel No results found for: CHOL, TRIG, HDL, CHOLHDL, VLDL, LDLCALC, LDLDIRECT  Physical Exam:    VS:  LMP 04/02/2006     Wt Readings from Last 3 Encounters:  09/27/21 290 lb (131.5 kg)  07/17/21 290 lb (131.5 kg)  05/29/21 290 lb (131.5 kg)     GEN:  Well nourished, well developed in no acute distress HEENT: Normal NECK: No JVD; No carotid bruits CARDIAC: RRR, no murmurs, rubs, gallops RESPIRATORY:  Clear to auscultation without rales, wheezing or rhonchi  ABDOMEN: Soft, non-tender, non-distended MUSCULOSKELETAL:  No edema; No deformity  SKIN: Warm and dry NEUROLOGIC:  Alert and oriented x 3 PSYCHIATRIC:  Normal affect   ASSESSMENT:    No  diagnosis found.   PLAN:    Paroxysmal atrial fibrillation: ED visit for palpitations 05/2021, found to be in A. fib with RVR.  Converted to sinus rhythm spontaneously.  CHA2DS2-VASc 2 (female, HTN).  Zio patch x14 days on 08/13/2021 showed 13 runs of SVT with longest lasting 17 beats, no significant arrhythmias.  Echocardiogram 08/06/2021 showed hyperdynamic LV systolic function, normal RV function, no significant valvular disease. -Continue diltiazem 120 mg daily -Continue Eliquis 5 mg twice daily -Positive sleep study 09/2021, starting CPAP  Hypertension: Continue diltiazem 120 mg daily  Chest pain: Reports episode of chest pain  occurring with stress.  Lexiscan Myoview on 12/19/2020 showed normal perfusion.  Morbid obesity: There is no height or weight on file to calculate BMI.  Diet/exercise recommended.  Discussed referral to healthy weight and wellness, she would like to hold off at this time and work on weight loss on her own.  OSA: Positive sleep study 09/2021, starting CPAP  Lipid screening: check lipid panel  DM screening: check A1c  RTC in***   Medication Adjustments/Labs and Tests Ordered: Current medicines are reviewed at length with the patient today.  Concerns regarding medicines are outlined above.  No orders of the defined types were placed in this encounter.   No orders of the defined types were placed in this encounter.    There are no Patient Instructions on file for this visit.   Signed, Little Ishikawa, MD  01/20/2022 2:55 PM    [facility]

## 2022-01-21 ENCOUNTER — Ambulatory Visit: Payer: No Typology Code available for payment source | Admitting: Cardiology

## 2022-01-23 ENCOUNTER — Other Ambulatory Visit: Payer: Self-pay | Admitting: Cardiology

## 2022-02-20 ENCOUNTER — Other Ambulatory Visit: Payer: Self-pay | Admitting: Cardiology

## 2022-02-20 NOTE — Telephone Encounter (Signed)
Prescription refill request for Eliquis received. ?Indication:Afib ?Last office visit:8/22 ?Scr:0.8 ?Age: 56 ?Weight:131.5 kg ? ?Prescription refilled ? ?

## 2022-04-25 ENCOUNTER — Other Ambulatory Visit: Payer: Self-pay | Admitting: Physician Assistant

## 2022-04-25 ENCOUNTER — Other Ambulatory Visit: Payer: Self-pay | Admitting: Rheumatology

## 2022-06-04 ENCOUNTER — Encounter: Payer: Self-pay | Admitting: Neurology

## 2022-06-04 ENCOUNTER — Ambulatory Visit: Payer: No Typology Code available for payment source | Admitting: Neurology

## 2022-06-04 VITALS — BP 169/97 | HR 72 | Ht 63.0 in | Wt 296.8 lb

## 2022-06-04 DIAGNOSIS — R519 Headache, unspecified: Secondary | ICD-10-CM

## 2022-06-04 DIAGNOSIS — H5713 Ocular pain, bilateral: Secondary | ICD-10-CM

## 2022-06-04 DIAGNOSIS — H547 Unspecified visual loss: Secondary | ICD-10-CM

## 2022-06-04 DIAGNOSIS — G441 Vascular headache, not elsewhere classified: Secondary | ICD-10-CM

## 2022-06-04 DIAGNOSIS — H4710 Unspecified papilledema: Secondary | ICD-10-CM | POA: Diagnosis not present

## 2022-06-04 DIAGNOSIS — G4489 Other headache syndrome: Secondary | ICD-10-CM | POA: Diagnosis not present

## 2022-06-04 DIAGNOSIS — G932 Benign intracranial hypertension: Secondary | ICD-10-CM

## 2022-06-04 DIAGNOSIS — R51 Headache with orthostatic component, not elsewhere classified: Secondary | ICD-10-CM

## 2022-06-04 NOTE — Progress Notes (Signed)
[facility]    Provider:  Dr Jaynee Eagles Requesting Provider: Ferd Hibbs, NP Primary Care Provider:  Ferd Hibbs, NP  CC:  headaches  HPI:  Christine [name] is a 56 y.o. female here as requested by Ferd Hibbs, NP for new onset headache.  Past medical history pseudotumor cerebri, fibromyalgia, A-fib, POTS, OSA, obesity, IBS, migraines.  I reviewed handwritten notes from Ferd Hibbs, last seen May 24, 2022, headaches for 2 weeks, cannot work, her eyes are twitching, blood pressure at home has been 108 over the 50s to 60s, takes magnesium gluconate daily, fibromyalgia worse with blood pressure medications, on Eliquis for A-fib, has been stressed at work.  She has had headaches in the past, this is not similar, she has migraines, these headaches are different. They started 3 weeks ago. They feel consistent headache, in the forehead from temple to temple. Her blood pressure has been lower. She was started on prednisone and had side effects so she had to stop. Nothing happened when the headache started, she was diagnosed with sleep apnea but has not treated it with either oral device or cpap. She was ordered a cpap. Consistent headache. Throughout the day. Laying down helps. Positional, worse standing, she have noticed some minimal vision changes, not exertional, vision has been blurrier than normal, she is due to get an eye exam in one month. No pulsating/pounding/throbbing or other significant migrainous featShe denies any stress, any focal neurologic symptoms. No jaw pain or other signs/symptoms of GCA and esr/crp normal. No other focal neurologic deficits, associated symptoms, inciting events or modifiable factors.   Reviewed notes, labs and imaging from outside physicians, which showed:  Reviewed blood work May 25, 2022, CBC normal, CMP unremarkable with creatinine 1.05 and BUN 17, TSH normal, sed rate 37 normal, CRP 6 normal  MRI brain 04/02/2021: IMPRESSION:  No  intrinsic pathology of the orbits or globes.  Question slight  prominence of cerebrospinal fluid in the optic nerve sheaths.  This  can be seen with increased intracranial pressure/pseudotumor.  The  finding is not definitely pathologic.   Review of Systems: Patient complains of symptoms per HPI as well as the following symptoms fibromyalgia. Pertinent negatives and positives per HPI. All others negative.   Social History   Socioeconomic History   Marital status: Married    Spouse name: Not on file   Number of children: Not on file   Years of education: Not on file   Highest education level: Not on file  Occupational History   Occupation: Aeronautical engineer: [employer]  Tobacco Use   Smoking status: Former    Packs/day: 0.25    Years: 2.00    Total pack years: 0.50    Types: Cigarettes    Quit date: 02/22/1988    Years since quitting: 34.3   Smokeless tobacco: Never  Vaping Use   Vaping Use: Never used  Substance and Sexual Activity   Alcohol use: Yes    Alcohol/week: 1.0 standard drink of alcohol    Types: 1 Glasses of wine per week    Comment: monthly   Drug use: No   Sexual activity: Yes    Birth control/protection: Surgical  Other Topics Concern   Not on file  Social History Narrative   Not on file   Social Determinants of Health   Financial Resource Strain: Not on file  Food Insecurity: Not on file  Transportation Needs: Not on file  Physical Activity: Not on file  Stress: Not on file  Social Connections: Not on file  Intimate Partner Violence: Not on file    Family History  Problem Relation Age of Onset   Asthma Mother    Heart Problems Father    Heart disease Brother    Scoliosis Son    Migraines Neg Hx    Headache Neg Hx     Past Medical History:  Diagnosis Date   Allergy    Diverticulitis    Fibromyalgia    POTS (postural orthostatic tachycardia syndrome)    per patient    Patient Active Problem List   Diagnosis Date Noted    Snoring 09/27/2021   Paroxysmal atrial fibrillation (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   Trochanteric bursitis of both hips 10/18/2016   Primary osteoarthritis of both knees 10/18/2016   Chondromalacia, patella, unspecified laterality 10/18/2016   Bilateral calcaneal spurs 10/18/2016   Vitamin D deficiency 10/18/2016   UTI (urinary tract infection) 11/08/2014   Allergic rhinitis 02/21/2013   Migraine 02/21/2013    Past Surgical History:  Procedure Laterality Date   ABDOMINAL HYSTERECTOMY     CESAREAN SECTION     FINGER SURGERY     left ring finger x 2   FRACTURE SURGERY     TUBAL LIGATION      Current Outpatient Medications  Medication Sig Dispense Refill   cholecalciferol (VITAMIN D) 1000 UNITS tablet Take 10,000 Units by mouth at bedtime.      diltiazem (CARDIZEM CD) 120 MG 24 hr capsule TAKE ONE CAPSULE BY MOUTH ONE TIME DAILY 30 capsule 5   ELIQUIS 5 MG TABS tablet TAKE ONE TABLET BY MOUTH TWICE A DAY 60 tablet 5   hydrOXYzine (VISTARIL) 25 MG capsule Take 25 mg by mouth as needed for anxiety.     hyoscyamine (LEVSIN SL) 0.125 MG SL tablet Place 0.125 mg under the tongue as needed for cramping.     MAGNESIUM PO Take 1 tablet by mouth at bedtime.     methocarbamol (ROBAXIN) 500 MG tablet Take 1 tablet (500 mg total) by mouth 2 (two) times daily as needed for muscle spasms. 60 tablet 2   tiZANidine (ZANAFLEX) 4 MG tablet TAKE ONE TABLET BY MOUTH AT BEDTIME AS NEEDED FOR MUSCLE SPASM 90 tablet 0   zolpidem (AMBIEN) 5 MG tablet Take 1 tablet (5 mg total) by mouth at bedtime as needed for sleep. (Patient taking differently: Take 5 mg by mouth as needed for sleep.) 30 tablet 0   No current facility-administered medications for this visit.    Allergies as of 06/04/2022 - Review Complete 06/04/2022  Allergen Reaction Noted    Other  06/28/2018   Penicillins Hives 07/28/2012   Sulfa antibiotics Hives 07/28/2012    Vitals: BP (!) 169/97   Pulse 72   Ht 5' 3"  (1.6 m)   Wt 296 lb 12.8 oz (134.6 kg)   LMP 04/02/2006   BMI 52.58 kg/m  Last Weight:  Wt Readings from Last 1 Encounters:  06/04/22 296 lb 12.8 oz (134.6 kg)   Last Height:   Ht Readings from Last 1 Encounters:  06/04/22 5' 3"  (1.6 m)     Physical exam: Exam: Gen: NAD, conversant, well nourised, obese, well groomed                     CV: RRR, no  MRG. No Carotid Bruits. No peripheral edema, warm, nontender Eyes: Conjunctivae clear without exudates or hemorrhage  Neuro: Detailed Neurologic Exam  Speech:    Speech is normal; fluent and spontaneous with normal comprehension.  Cognition:    The patient is oriented to person, place, and time;     recent and remote memory intact;     language fluent;     normal attention, concentration,     fund of knowledge Cranial Nerves:    The pupils are equal, round, and reactive to light. Blurring of the margins. Visual fields are full to finger confrontation. Extraocular movements are intact. Trigeminal sensation is intact and the muscles of mastication are normal. The face is symmetric. The palate elevates in the midline. Hearing intact. Voice is normal. Shoulder shrug is normal. The tongue has normal motion without fasciculations.   Coordination:    Normal finger to nose and heel to shin. Normal rapid alternating movements.   Gait:    Heel-toe and tandem gait are normal.   Motor Observation:    No asymmetry, no atrophy, and no involuntary movements noted. Tone:    Normal muscle tone.    Posture:    Posture is normal. normal erect    Strength:    Strength is V/V in the upper and lower limbs.      Sensation: intact to LT     Reflex Exam:  DTR's:    Deep tendon reflexes in the upper and lower extremities are symmetrical, difficult exam due to extremely large body habitus.   Toes:     The toes are downgoing bilaterally.   Clonus:    Clonus is absent.    Assessment/Plan:  Patient with tension-type headache/ pressure headache with a PMHx of IDIOPATHIC INTRACRANIAL HYPERTENSION, in 2012 MRI showed prominence of cerebrospinal fluid in the optic nerve sheaths but OP was 14. Today margins of fundi are blurred. We will re-evaluate.   -Untreated sleep apnea, would reach out and see about cpap, untreated sleep apnea can cause headaches and discussed other sequelae. -No migrainous features. No jaw pain or other signs/symptoms of GCA and esr/crp normal. -Needs eye exam, please call for appointment -She has had IDIOPATHIC INTRACRANIAL HYPERTENSION in the past will recheck: MRI of the brain and orbits and MRV of the head, Lumbar puncture for opening pressure -- After testing we can restart diamox or topiramate based on testing    Orders Placed This Encounter  Procedures   MR BRAIN W WO CONTRAST   MR MRV HEAD WO CM   DG FLUORO GUIDED LOC OF NEEDLE/CATH TIP FOR SPINAL INJECT LT   MR ORBITS W WO CONTRAST   No orders of the defined types were placed in this encounter.   Cc: Ferd Hibbs, NP,  Ferd Hibbs, NP  [facility], [facility] [address] [place], [place] [address]  Phone [phone] Fax [phone]

## 2022-06-04 NOTE — Patient Instructions (Signed)
Untreated sleep apnea, would reach out and see the cpap is, untreated sleep apnea can cause headaches and discussed other sequelae. No migrainous features. 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 The fluid that surrounds the brain and spinal cord (cerebrospinal fluid, or CSF) increases and causes the pressure. Idiopathic means that the cause of this condition is not known. IIH affects the brain and spinal cord (neurological disorder). If this condition is not treated, it can cause vision loss or blindness. What are the causes? The cause of this condition is not known. What increases the risk? The following factors may make you more likely to develop this condition: Being very overweight (obese). Being a female between the ages of 59 and 57 years old, who has not gone through menopause. Taking certain medicines, such as birth control or steroids. What are the signs or symptoms? Symptoms of this condition include: Headaches. This is the most common symptom. Brief episodes of total blindness. Double vision, blurred vision, or poor side (peripheral) vision. Pain in the shoulders or neck. Nausea and vomiting. A sound like rushing water or a pulsing sound within the ears (pulsatile tinnitus), or ringing in the ears. How is this diagnosed? This condition may be diagnosed based on: Your symptoms and medical history. Imaging tests of the brain, such as: CT scan. MRI. Magnetic resonance venogram (MRV) to check the veins. Diagnostic  lumbar puncture. This is a procedure to remove and examine a sample of cerebrospinal fluid. This procedure can determine whether too much fluid may be causing IIH. A thorough eye exam to check for swelling or nerve damage in the eyes. How is this treated? Treatment for this condition depends on the symptoms. The goal of treatment is to decrease the pressure around your brain. Common treatments include: Weight loss through healthy eating, salt restriction, and exercise, if you are overweight. Medicines to decrease the production of spinal fluid and lower the pressure within your skull. Medicines to prevent or treat headaches. Other treatments may include: Surgery to place drains (shunts) in your brain for removing excess fluid. Lumbar puncture to remove excess cerebrospinal fluid. Follow these instructions at home: If you are overweight or obese, work with your health care provider to lose weight. Take over-the-counter and prescription medicines only as told by your health care provider. Ask your health care provider if the medicine prescribed to you requires you to avoid driving or using machinery. Do not use any products that contain nicotine or tobacco, such as cigarettes, e-cigarettes, and chewing tobacco. If you need help quitting, ask your health care provider. Keep all follow-up visits as told by your health care provider. This is important. Contact a health care provider if: You have changes in your vision, such as: Double vision. Blurred vision. Poor peripheral vision. Get help right away if: You have any of the following symptoms and they get worse or do not get better: Headaches. Nausea. Vomiting. Sudden trouble seeing. Summary Idiopathic intracranial hypertension (IIH) is a condition that increases pressure around the brain. The cause is not known (is idiopathic). The most common symptom of IIH  is headaches. Vision changes, pain in the shoulders or neck, nausea, and vomiting  may also occur. Treatment for this condition depends on your symptoms. The goal of treatment is to decrease the pressure around your brain. If you are overweight or obese, work with your health care provider to lose weight. Take over-the-counter and prescription medicines only as told by your health care provider. This information is not intended to replace advice given to you by your health care provider. Make sure you discuss any questions you have with your health care provider. Document Revised: 11/13/2019 Document Reviewed: 11/13/2019 Elsevier Patient Education  [place].  Acetazolamide Tablets What is this medication? ACETAZOLAMIDE (a set a ZOLE a mide) reduces swelling related to heart disease. It may also be used to reduce swelling caused by medications. It helps your kidneys remove more fluid and salt from your blood through the urine. It may also be used to treat conditions with increased pressure of the eye, such as glaucoma. It can be used with other medications to prevent and control seizures in people with epilepsy. It can also be used to prevent or treat symptoms of altitude sickness. It works by increasing the amount of oxygen in your body. It belongs to a group of medications called diuretics. This medicine may be used for other purposes; ask your health care provider or pharmacist if you have questions. COMMON BRAND NAME(S): Diamox What should I tell my care team before I take this medication? They need to know if you have any of these conditions: Glaucoma Kidney disease Liver disease Low adrenal gland function Lung or breathing disease An unusual or allergic reaction to acetazolamide, other medications, foods, dyes, or preservatives Pregnant or trying to get pregnant Breast-feeding How should I use this medication? Take this medication by mouth. Take it as directed on the prescription label at the same time every day. You can take it with or without food. If it  upsets your stomach, take it with food. Keep taking it unless your care team tells you to stop. Talk to your care team about the use of this medication in children. Special care may be needed. Overdosage: If you think you have taken too much of this medicine contact a poison control center or emergency room at once. NOTE: This medicine is only for you. Do not share this medicine with others. What if I miss a dose? If you miss a dose, take it as soon as you can. If it is almost time for your next dose, take only that dose. Do not take double or extra doses. What may interact with this medication? Do not take this medication with any of the following: Methazolamide This medication may also interact with the following: Aspirin and aspirin-like medications Cyclosporine Lithium Medication for diabetes Methenamine Other diuretics Phenytoin Primidone Quinidine Sodium bicarbonate Stimulant medications, such as dextroamphetamine This list may not describe all possible interactions. Give your health care provider a list of all the medicines, herbs, non-prescription drugs, or dietary supplements you use. Also tell them if you smoke, drink alcohol, or use illegal drugs. Some items may interact with your medicine. What should I watch for while using this medication? Visit your care team for regular checks on your progress. Tell your care team if your symptoms do not start to get better or if they get worse. This medication may cause serious skin reactions. They can happen weeks to months after starting the medication. Contact your care team right away if you notice  fevers or flu-like symptoms with a rash. The rash may be red or purple and then turn into blisters or peeling of the skin. Or, you might notice a red rash with swelling of the face, lips, or lymph nodes in your neck or under your arms. This medication may affect your coordination, reaction time, or judgment. Do not drive or operate machinery  until you know how this medication affects you. Sit up or stand slowly to reduce the risk of dizzy or fainting spells. What side effects may I notice from receiving this medication? Side effects that you should report to your care team as soon as possible: Allergic reactions--skin rash, itching, hives, swelling of the face, lips, tongue, or throat Aplastic anemia--unusual weakness or fatigue, dizziness, headache, trouble breathing, increased bleeding or bruising High acid level--trouble breathing, unusual weakness or fatigue, confusion, headache, fast or irregular heartbeat, nausea, vomiting Infection--fever, chills, cough, or sore throat Kidney stones--blood in the urine, pain or trouble passing urine, pain in the lower back or sides Liver injury--right upper belly pain, loss of appetite, nausea, light-colored stool, dark yellow or brown urine, yellowing skin or eyes, unusual weakness or fatigue Low potassium level--muscle pain or cramps, unusual weakness or fatigue, fast or irregular heartbeat, constipation Redness, blistering, peeling, or loosening of the skin, including inside the mouth Side effects that usually do not require medical attention (report to your care team if they continue or are bothersome): Blurry vision Change in taste Loss of appetite Pain, tingling, or numbness in the hands or feet This list may not describe all possible side effects. Call your doctor for medical advice about side effects. You may report side effects to FDA at 1-800-FDA-1088. Where should I keep my medication? Keep out of the reach of children and pets. Store at room temperature between 20 and 25 degrees C (68 and 77 degrees F). Throw away any unused medication after the expiration date. NOTE: This sheet is a summary. It may not cover all possible information. If you have questions about this medicine, talk to your doctor, pharmacist, or health care provider.  2023 Elsevier/Gold Standard (2022-01-15  00:00:00)  Topiramate Tablets What is this medication? TOPIRAMATE (toe PYRE a mate) prevents and controls seizures in people with epilepsy. It may also be used to prevent migraine headaches. It works by calming overactive nerves in your body. This medicine may be used for other purposes; ask your health care provider or pharmacist if you have questions. COMMON BRAND NAME(S): Topamax, Topiragen What should I tell my care team before I take this medication? They need to know if you have any of these conditions: Bleeding disorder Kidney disease Lung disease Suicidal thoughts, plans or attempt An unusual or allergic reaction to topiramate, other medications, foods, dyes, or preservatives Pregnant or trying to get pregnant Breast-feeding How should I use this medication? Take this medication by mouth with water. Take it as directed on the prescription label at the same time every day. Do not cut, crush or chew this medicine. Swallow the tablets whole. You can take it with or without food. If it upsets your stomach, take it with food. Keep taking it unless your care team tells you to stop. A special MedGuide will be given to you by the pharmacist with each prescription and refill. Be sure to read this information carefully each time. Talk to your care team about the use of this medication in children. While it may be prescribed for children as young as 2 years for selected  conditions, precautions do apply. Overdosage: If you think you have taken too much of this medicine contact a poison control center or emergency room at once. NOTE: This medicine is only for you. Do not share this medicine with others. What if I miss a dose? If you miss a dose, take it as soon as you can unless it is within 6 hours of the next dose. If it is within 6 hours of the next dose, skip the missed dose. Take the next dose at the normal time. Do not take double or extra doses. What may interact with this  medication? Acetazolamide Alcohol Antihistamines for allergy, cough, and cold Aspirin and aspirin-like medications Atropine Birth control pills Certain medications for anxiety or sleep Certain medications for bladder problems like oxybutynin, tolterodine Certain medications for depression like amitriptyline, fluoxetine, sertraline Certain medications for Parkinson's disease like benztropine, trihexyphenidyl Certain medications for seizures like carbamazepine, lamotrigine, phenobarbital, phenytoin, primidone, valproic acid, zonisamide Certain medications for stomach problems like dicyclomine, hyoscyamine Certain medications for travel sickness like scopolamine Certain medications that treat or prevent blood clots like warfarin, enoxaparin, dalteparin, apixaban, dabigatran, and rivaroxaban Digoxin Diltiazem General anesthetics like halothane, isoflurane, methoxyflurane, propofol Glyburide Hydrochlorothiazide Ipratropium Lithium Medications that relax muscles Metformin Narcotic medications for pain NSAIDs, medications for pain and inflammation, like ibuprofen or naproxen Phenothiazines like chlorpromazine, mesoridazine, prochlorperazine, thioridazine Pioglitazone This list may not describe all possible interactions. Give your health care provider a list of all the medicines, herbs, non-prescription drugs, or dietary supplements you use. Also tell them if you smoke, drink alcohol, or use illegal drugs. Some items may interact with your medicine. What should I watch for while using this medication? Visit your care team for regular checks on your progress. Tell your care team if your symptoms do not start to get better or if they get worse. Do not suddenly stop taking this medication. You may develop a severe reaction. Your care team will tell you how much medication to take. If your care team wants you to stop the medication, the dose may be slowly lowered over time to avoid any side  effects. Wear a medical ID bracelet or chain. Carry a card that describes your condition. List the medications and doses you take on the card. You may get drowsy or dizzy. Do not drive, use machinery, or do anything that needs mental alertness until you know how this medication affects you. Do not stand up or sit up quickly, especially if you are an older patient. This reduces the risk of dizzy or fainting spells. Alcohol may interfere with the effects of this medication. Avoid alcoholic drinks. This medication may cause serious skin reactions. They can happen weeks to months after starting the medication. Contact your care team right away if you notice fevers or flu-like symptoms with a rash. The rash may be red or purple and then turn into blisters or peeling of the skin. Or, you might notice a red rash with swelling of the face, lips or lymph nodes in your neck or under your arms. Watch for new or worsening thoughts of suicide or depression. This includes sudden changes in mood, behaviors, or thoughts. These changes can happen at any time but are more common in the beginning of treatment or after a change in dose. Call your care team right away if you experience these thoughts or worsening depression. This medication may slow your child's growth if it is taken for a long time at high doses. Your care team will  monitor your child's growth. Using this medication for a long time may weaken your bones. The risk of bone fractures may be increased. Talk to your care team about your bone health. Do not become pregnant while taking this medication. Hormone forms of birth control may not work as well with this medication. Talk to your care team about other forms of birth control. There is potential for serious harm to an unborn child. Tell your care team right away if you think you might be pregnant. What side effects may I notice from receiving this medication? Side effects that you should report to your care  team as soon as possible: Allergic reactions--skin rash, itching, hives, swelling of the face, lips, tongue, or throat High acid level--trouble breathing, unusual weakness or fatigue, confusion, headache, fast or irregular heartbeat, nausea, vomiting High ammonia level--unusual weakness or fatigue, confusion, loss of appetite, nausea, vomiting, seizures Fever that does not go away, decrease in sweat Kidney stones--blood in the urine, pain or trouble passing urine, pain in the lower back or sides Redness, blistering, peeling or loosening of the skin, including inside the mouth Sudden eye pain or change in vision such as blurry vision, seeing halos around lights, vision loss Thoughts of suicide or self-harm, worsening mood, feelings of depression Side effects that usually do not require medical attention (report to your care team if they continue or are bothersome): Burning or tingling sensation in hands or feet Difficulty with paying attention, memory, or speech Dizziness Drowsiness Fatigue Loss of appetite with weight loss Slow or sluggish movements of the body This list may not describe all possible side effects. Call your doctor for medical advice about side effects. You may report side effects to FDA at 1-800-FDA-1088. Where should I keep my medication? Keep out of the reach of children and pets. Store between 15 and 30 degrees C (59 and 86 degrees F). Protect from moisture. Keep the container tightly closed. Get rid of any unused medication after the expiration date. To get rid of medications that are no longer needed or have expired: Take the medication to a medication take-back program. Check with your pharmacy or law enforcement to find a location. If you cannot return the medication, check the label or package insert to see if the medication should be thrown out in the garbage or flushed down the toilet. If you are not sure, ask your care team. If it is safe to put it in the trash,  empty the medication out of the container. Mix the medication with cat litter, dirt, coffee grounds, or other unwanted substance. Seal the mixture in a bag or container. Put it in the trash. NOTE: This sheet is a summary. It may not cover all possible information. If you have questions about this medicine, talk to your doctor, pharmacist, or health care provider.  2023 Elsevier/Gold Standard (2021-02-26 00:00:00)

## 2022-06-24 ENCOUNTER — Ambulatory Visit
Admission: RE | Admit: 2022-06-24 | Discharge: 2022-06-24 | Disposition: A | Discharge status: Home / Self Care | Payer: No Typology Code available for payment source | Source: Ambulatory Visit | Attending: Neurology | Admitting: Neurology

## 2022-06-24 DIAGNOSIS — R519 Headache, unspecified: Secondary | ICD-10-CM

## 2022-06-24 DIAGNOSIS — G932 Benign intracranial hypertension: Secondary | ICD-10-CM

## 2022-06-24 DIAGNOSIS — G4489 Other headache syndrome: Secondary | ICD-10-CM

## 2022-06-24 DIAGNOSIS — H547 Unspecified visual loss: Secondary | ICD-10-CM

## 2022-06-24 DIAGNOSIS — H4710 Unspecified papilledema: Secondary | ICD-10-CM

## 2022-06-24 DIAGNOSIS — G441 Vascular headache, not elsewhere classified: Secondary | ICD-10-CM

## 2022-06-24 DIAGNOSIS — H5713 Ocular pain, bilateral: Secondary | ICD-10-CM

## 2022-06-24 DIAGNOSIS — R51 Headache with orthostatic component, not elsewhere classified: Secondary | ICD-10-CM

## 2022-06-24 MED ORDER — GADOBENATE DIMEGLUMINE 529 MG/ML IV SOLN
20.0000 mL | Freq: Once | INTRAVENOUS | Status: AC | PRN
  Administered 2022-06-24: 20 mL via INTRAVENOUS

## 2022-06-27 ENCOUNTER — Telehealth: Payer: Self-pay | Admitting: *Deleted

## 2022-06-27 NOTE — Telephone Encounter (Signed)
-----   Message from Asa Lente, MD sent at 06/25/2022  9:11 PM EDT ----- This is 1 of Toni's patients.  The MRI showed that the brain was normal.  The pituitary gland was pushed down a little bit.  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  Please send her for a lumbar puncture with measurement of the opening pressure

## 2022-06-27 NOTE — Telephone Encounter (Signed)
Spoke with patient and let her know that her MRI of her brain was normal.  The pituitary gland was pushed down a little bit.  Also noted that she has papilledema.  This could mean that the spinal fluid pressure is elevated.  I let her know she would have a lumbar puncture done to measure the opening pressure.  She stated she had already scheduled the lumbar puncture for tomorrow morning.  Will await results.  She will follow post-procedure instructions from [facility] imaging and will call us if there are any signs of spinal headache after she has completed the bedrest period.  Patient's questions were answered and she was very appreciative for the call.

## 2022-06-28 ENCOUNTER — Inpatient Hospital Stay
Admission: RE | Admit: 2022-06-28 | Discharge: 2022-06-28 | Disposition: A | Discharge status: Home / Self Care | Payer: No Typology Code available for payment source | Source: Ambulatory Visit | Attending: Neurology | Admitting: Neurology

## 2022-06-28 NOTE — Discharge Instructions (Addendum)

## 2022-07-12 ENCOUNTER — Ambulatory Visit
Admission: RE | Admit: 2022-07-12 | Discharge: 2022-07-12 | Disposition: A | Discharge status: Home / Self Care | Payer: No Typology Code available for payment source | Source: Ambulatory Visit | Attending: Neurology | Admitting: Neurology

## 2022-07-12 VITALS — BP 159/81 | HR 82

## 2022-07-12 DIAGNOSIS — G441 Vascular headache, not elsewhere classified: Secondary | ICD-10-CM

## 2022-07-12 DIAGNOSIS — G932 Benign intracranial hypertension: Secondary | ICD-10-CM

## 2022-07-12 DIAGNOSIS — H5713 Ocular pain, bilateral: Secondary | ICD-10-CM

## 2022-07-12 DIAGNOSIS — R519 Headache, unspecified: Secondary | ICD-10-CM

## 2022-07-12 DIAGNOSIS — H4710 Unspecified papilledema: Secondary | ICD-10-CM

## 2022-07-12 DIAGNOSIS — H547 Unspecified visual loss: Secondary | ICD-10-CM

## 2022-07-12 DIAGNOSIS — G4489 Other headache syndrome: Secondary | ICD-10-CM

## 2022-07-12 DIAGNOSIS — R51 Headache with orthostatic component, not elsewhere classified: Secondary | ICD-10-CM

## 2022-07-12 LAB — CSF CELL COUNT WITH DIFFERENTIAL
RBC Count, CSF: 0 cells/uL
TOTAL NUCLEATED CELL: 2 cells/uL (ref 0–5)

## 2022-07-12 LAB — GLUCOSE, CSF: Glucose, CSF: 60 mg/dL (ref 40–80)

## 2022-07-12 LAB — PROTEIN, CSF: Total Protein, CSF: 29 mg/dL (ref 15–45)

## 2022-07-12 NOTE — Discharge Instructions (Signed)

## 2022-07-17 ENCOUNTER — Telehealth: Payer: Self-pay | Admitting: *Deleted

## 2022-07-17 NOTE — Telephone Encounter (Addendum)
Spoke with patient and discussed results of LP as noted below. Patient has concerns about Diamox and she did not like the Topamax. She would like to hold off on starting medication and discuss this at her 8/15 office visit. She states she is interested in treating with weight loss. She is also hesitant about possible re-testing later with another LP as she is dealing with back pain after the LP and is having a hard time getting comfortable. She has taken her muscle relaxer. She states she has ankylosing spondylitis.

## 2022-07-17 NOTE — Telephone Encounter (Signed)
-----   Message from Anson Fret, MD sent at 07/16/2022 12:11 PM EDT ----- Pod 4: Opening pressure was elevated at 29, she has a hx of IDIOPATHIC INTRACRANIAL HYPERTENSION . I would like to start Diamox. Please discuss with patient and if ok I can order it thanks

## 2022-07-29 NOTE — Progress Notes (Unsigned)
No chief complaint on file.   HISTORY OF PRESENT ILLNESS:  07/29/22 ALL:  Christine [name] is a 56 y.o. female here today for follow up for IIH. She had an MRI 06/2022 showing concerns for papilledema. She was sent for LP showing opening pressure of 29. Diamox advised but she is hesitant. She has previous taken topiramate and did not like side effects.    HISTORY (copied from Dr Cathren Laine previous note)  HPI:  Christine [name] is a 56 y.o. female here as requested by Ferd Hibbs, NP for new onset headache.  Past medical history pseudotumor cerebri, fibromyalgia, A-fib, POTS, OSA, obesity, IBS, migraines.  I reviewed handwritten notes from Ferd Hibbs, last seen May 24, 2022, headaches for 2 weeks, cannot work, her eyes are twitching, blood pressure at home has been 108 over the 50s to 60s, takes magnesium gluconate daily, fibromyalgia worse with blood pressure medications, on Eliquis for A-fib, has been stressed at work.   She has had headaches in the past, this is not similar, she has migraines, these headaches are different. They started 3 weeks ago. They feel consistent headache, in the forehead from temple to temple. Her blood pressure has been lower. She was started on prednisone and had side effects so she had to stop. Nothing happened when the headache started, she was diagnosed with sleep apnea but has not treated it with either oral device or cpap. She was ordered a cpap. Consistent headache. Throughout the day. Laying down helps. Positional, worse standing, she have noticed some minimal vision changes, not exertional, vision has been blurrier than normal, she is due to get an eye exam in one month. No pulsating/pounding/throbbing or other significant migrainous featShe denies any stress, any focal neurologic symptoms. No jaw pain or other signs/symptoms of GCA and esr/crp normal. No other focal neurologic deficits, associated symptoms, inciting events or modifiable factors.    Reviewed notes, labs and imaging from outside physicians, which showed:   Reviewed blood work May 25, 2022, CBC normal, CMP unremarkable with creatinine 1.05 and BUN 17, TSH normal, sed rate 37 normal, CRP 6 normal   MRI brain 04/02/2021: IMPRESSION:  No intrinsic pathology of the orbits or globes.  Question slight  prominence of cerebrospinal fluid in the optic nerve sheaths.  This  can be seen with increased intracranial pressure/pseudotumor.  The  finding is not definitely pathologic.    REVIEW OF SYSTEMS: Out of a complete 14 system review of symptoms, the patient complains only of the following symptoms, and all other reviewed systems are negative.   ALLERGIES: Allergies  Allergen Reactions   Other     "an IBS med"   Penicillins Hives   Sulfa Antibiotics Hives     HOME MEDICATIONS: Outpatient Medications Prior to Visit  Medication Sig Dispense Refill   cholecalciferol (VITAMIN D) 1000 UNITS tablet Take 10,000 Units by mouth at bedtime.      diltiazem (CARDIZEM CD) 120 MG 24 hr capsule TAKE ONE CAPSULE BY MOUTH ONE TIME DAILY 30 capsule 5   ELIQUIS 5 MG TABS tablet TAKE ONE TABLET BY MOUTH TWICE A DAY 60 tablet 5   hydrOXYzine (VISTARIL) 25 MG capsule Take 25 mg by mouth as needed for anxiety.     hyoscyamine (LEVSIN SL) 0.125 MG SL tablet Place 0.125 mg under the tongue as needed for cramping.     MAGNESIUM PO Take 1 tablet by mouth at bedtime.     methocarbamol (ROBAXIN) 500 MG tablet Take  1 tablet (500 mg total) by mouth 2 (two) times daily as needed for muscle spasms. 60 tablet 2   tiZANidine (ZANAFLEX) 4 MG tablet TAKE ONE TABLET BY MOUTH AT BEDTIME AS NEEDED FOR MUSCLE SPASM 90 tablet 0   zolpidem (AMBIEN) 5 MG tablet Take 1 tablet (5 mg total) by mouth at bedtime as needed for sleep. (Patient taking differently: Take 5 mg by mouth as needed for sleep.) 30 tablet 0   No facility-administered medications prior to visit.     PAST MEDICAL HISTORY: Past Medical  History:  Diagnosis Date   Allergy    Diverticulitis    Fibromyalgia    POTS (postural orthostatic tachycardia syndrome)    per patient     PAST SURGICAL HISTORY: Past Surgical History:  Procedure Laterality Date   ABDOMINAL HYSTERECTOMY     CESAREAN SECTION     FINGER SURGERY     left ring finger x 2   FRACTURE SURGERY     TUBAL LIGATION       FAMILY HISTORY: Family History  Problem Relation Age of Onset   Asthma Mother    Heart Problems Father    Heart disease Brother    Scoliosis Son    Migraines Neg Hx    Headache Neg Hx      SOCIAL HISTORY: Social History   Socioeconomic History   Marital status: Married    Spouse name: Not on file   Number of children: Not on file   Years of education: Not on file   Highest education level: Not on file  Occupational History   Occupation: Aeronautical engineer: [employer]  Tobacco Use   Smoking status: Former    Packs/day: 0.25    Years: 2.00    Total pack years: 0.50    Types: Cigarettes    Quit date: 02/22/1988    Years since quitting: 34.4   Smokeless tobacco: Never  Vaping Use   Vaping Use: Never used  Substance and Sexual Activity   Alcohol use: Yes    Alcohol/week: 1.0 standard drink of alcohol    Types: 1 Glasses of wine per week    Comment: monthly   Drug use: No   Sexual activity: Yes    Birth control/protection: Surgical  Other Topics Concern   Not on file  Social History Narrative   Not on file   Social Determinants of Health   Financial Resource Strain: Not on file  Food Insecurity: Not on file  Transportation Needs: Not on file  Physical Activity: Not on file  Stress: Not on file  Social Connections: Not on file  Intimate Partner Violence: Not on file     PHYSICAL EXAM  There were no vitals filed for this visit. There is no height or weight on file to calculate BMI.  Generalized: Well developed, in no acute distress  Cardiology: normal rate and rhythm, no murmur  auscultated  Respiratory: clear to auscultation bilaterally    Neurological examination  Mentation: Alert oriented to time, place, history taking. Follows all commands speech and language fluent Cranial nerve II-XII: Pupils were equal round reactive to light. Extraocular movements were full, visual field were full on confrontational test. Facial sensation and strength were normal. Uvula tongue midline. Head turning and shoulder shrug  were normal and symmetric. Motor: The motor testing reveals 5 over 5 strength of all 4 extremities. Good symmetric motor tone is noted throughout.  Sensory: Sensory testing is intact to soft touch  on all 4 extremities. No evidence of extinction is noted.  Coordination: Cerebellar testing reveals good finger-nose-finger and heel-to-shin bilaterally.  Gait and station: Gait is normal. Tandem gait is normal. Romberg is negative. No drift is seen.  Reflexes: Deep tendon reflexes are symmetric and normal bilaterally.    DIAGNOSTIC DATA (LABS, IMAGING, TESTING) - I reviewed patient records, labs, notes, testing and imaging myself where available.  Lab Results  Component Value Date   WBC 7.0 05/19/2021   HGB 15.8 (H) 05/19/2021   HCT 48.0 (H) 05/19/2021   MCV 89.7 05/19/2021   PLT 368 05/19/2021      Component Value Date/Time   NA 140 05/19/2021 1947   K 4.3 05/19/2021 1947   CL 103 05/19/2021 1947   CO2 25 05/19/2021 1947   GLUCOSE 98 05/19/2021 1947   BUN 7 05/19/2021 1947   CREATININE 0.82 05/19/2021 1947   CALCIUM 10.0 05/19/2021 1947   PROT 7.3 05/19/2021 1947   ALBUMIN 4.0 05/19/2021 1947   AST 28 05/19/2021 1947   ALT 29 05/19/2021 1947   ALKPHOS 59 05/19/2021 1947   BILITOT 1.1 05/19/2021 1947   GFRNONAA >60 05/19/2021 1947   GFRAA >60 02/15/2020 1631   No results found for: "CHOL", "HDL", "LDLCALC", "LDLDIRECT", "TRIG", "CHOLHDL" No results found for: "HGBA1C" No results found for: "VITAMINB12" No results found for: "TSH"      No data  to display               No data to display           ASSESSMENT AND PLAN  56 y.o. year old female  has a past medical history of Allergy, Diverticulitis, Fibromyalgia, and POTS (postural orthostatic tachycardia syndrome). here with    No diagnosis found.  Mylissa J Marty ***.  Healthy lifestyle habits encouraged. *** will follow up with PCP as directed. *** will return to see me in ***, sooner if needed. *** verbalizes understanding and agreement with this plan.   No orders of the defined types were placed in this encounter.    No orders of the defined types were placed in this encounter.    Debbora Presto, MSN, FNP-C 07/29/2022, 4:00 PM  [facility] [address] [place], [place] [address] [phone]

## 2022-07-29 NOTE — Patient Instructions (Incomplete)
Below is our plan:  We will consider extended release topiramate. I have provided information in your summary. Please get your eye exam asap and ask them to send [place] the information.   Please make sure you are staying well hydrated. I recommend 50-60 ounces daily. Well balanced diet and regular exercise encouraged. Consistent sleep schedule with 6-8 hours recommended.   Please continue follow up with care team as directed.   Follow up with me in 4-6 months   You may receive a survey regarding today's visit. I encourage you to leave honest feed back as I do use this information to improve patient care. Thank you for seeing me today!

## 2022-07-30 ENCOUNTER — Ambulatory Visit: Payer: No Typology Code available for payment source | Admitting: Family Medicine

## 2022-07-30 ENCOUNTER — Other Ambulatory Visit: Payer: Self-pay | Admitting: Cardiology

## 2022-07-30 ENCOUNTER — Encounter: Payer: Self-pay | Admitting: Family Medicine

## 2022-07-30 VITALS — BP 150/90 | HR 77 | Ht 63.0 in | Wt 290.0 lb

## 2022-07-30 DIAGNOSIS — G932 Benign intracranial hypertension: Secondary | ICD-10-CM

## 2022-09-04 ENCOUNTER — Ambulatory Visit: Payer: No Typology Code available for payment source | Attending: Physician Assistant | Admitting: Physician Assistant

## 2022-09-04 ENCOUNTER — Encounter: Payer: Self-pay | Admitting: Physician Assistant

## 2022-09-04 VITALS — BP 170/86 | HR 80 | Resp 14 | Ht 63.0 in | Wt 294.0 lb

## 2022-09-04 DIAGNOSIS — G90A Postural orthostatic tachycardia syndrome (POTS): Secondary | ICD-10-CM

## 2022-09-04 DIAGNOSIS — M797 Fibromyalgia: Secondary | ICD-10-CM | POA: Diagnosis not present

## 2022-09-04 DIAGNOSIS — R5383 Other fatigue: Secondary | ICD-10-CM

## 2022-09-04 DIAGNOSIS — M7731 Calcaneal spur, right foot: Secondary | ICD-10-CM

## 2022-09-04 DIAGNOSIS — M7062 Trochanteric bursitis, left hip: Secondary | ICD-10-CM

## 2022-09-04 DIAGNOSIS — Z8719 Personal history of other diseases of the digestive system: Secondary | ICD-10-CM

## 2022-09-04 DIAGNOSIS — M7061 Trochanteric bursitis, right hip: Secondary | ICD-10-CM

## 2022-09-04 DIAGNOSIS — Z8669 Personal history of other diseases of the nervous system and sense organs: Secondary | ICD-10-CM

## 2022-09-04 DIAGNOSIS — E559 Vitamin D deficiency, unspecified: Secondary | ICD-10-CM

## 2022-09-04 DIAGNOSIS — M7732 Calcaneal spur, left foot: Secondary | ICD-10-CM

## 2022-09-04 DIAGNOSIS — F5101 Primary insomnia: Secondary | ICD-10-CM | POA: Diagnosis not present

## 2022-09-04 DIAGNOSIS — M5136 Other intervertebral disc degeneration, lumbar region: Secondary | ICD-10-CM

## 2022-09-04 DIAGNOSIS — M170 Bilateral primary osteoarthritis of knee: Secondary | ICD-10-CM

## 2022-09-04 MED ORDER — ZOLPIDEM TARTRATE 5 MG PO TABS
5.0000 mg | ORAL_TABLET | Freq: Every evening | ORAL | 0 refills | Status: DC | PRN
Start: 2022-09-04 — End: 2023-01-20

## 2022-09-04 MED ORDER — TIZANIDINE HCL 4 MG PO TABS: ORAL_TABLET | ORAL | 0 refills | Status: DC

## 2022-09-04 MED ORDER — METHOCARBAMOL 500 MG PO TABS: 500.0000 mg | ORAL_TABLET | Freq: Two times a day (BID) | ORAL | 2 refills | Status: DC | PRN

## 2022-09-04 NOTE — Progress Notes (Signed)
Office Visit Note  Patient: Christine [name]             Date of Birth: [birth year]           MRN: [id number]             PCP: Lance Bosch, NP Referring: Lance Bosch, NP Visit Date: 09/04/2022 Occupation: @GUAROCC @  Subjective:  Generalized pain   History of Present Illness: Christine [name] is a 56 y.o. female with history of fibromyalgia, osteoarthritis, and DDD.  She continues to experience generalized myalgias and muscle tenderness due to fibromyalgia.  She does not feel as though her pain level has been as adequately managed as it previously was.  She continues to have persistent discomfort in her lower back. She never received a call for physical therapy after her last office visit.  She continues to take Robaxin 500 mg BID PRN and zanaflex 4 mg at bedtime.  She takes ambien 5 mg at bedtime for insomnia. She continues to have interrupted sleep at night due to nocturnal pain. She has ongoing fatigue secondary to insomnia.  On the nights that she takes tizanidine she avoids taking Ambien as advised.    Activities of Daily Living:  Patient reports morning stiffness for 30 minutes.   Patient Reports nocturnal pain.  Difficulty dressing/grooming: Denies Difficulty climbing stairs: Denies Difficulty getting out of chair: Denies Difficulty using hands for taps, buttons, cutlery, and/or writing: Denies  Review of Systems  Constitutional:  Positive for fatigue.  HENT:  Positive for mouth dryness. Negative for mouth sores.   Eyes:  Negative for dryness.  Respiratory:  Negative for shortness of breath.   Cardiovascular:  Negative for chest pain and palpitations.  Gastrointestinal:  Negative for blood in stool, constipation and diarrhea.  Endocrine: Negative for increased urination.  Genitourinary:  Negative for involuntary urination.  Musculoskeletal:  Positive for joint pain, joint pain, myalgias, muscle weakness, morning stiffness, muscle tenderness and myalgias. Negative  for gait problem and joint swelling.  Skin:  Positive for hair loss and sensitivity to sunlight. Negative for color change and rash.  Allergic/Immunologic: Negative for susceptible to infections.  Neurological:  Positive for headaches. Negative for dizziness.  Hematological:  Negative for swollen glands.  Psychiatric/Behavioral:  Positive for sleep disturbance. Negative for depressed mood. The patient is nervous/anxious.     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   Trochanteric bursitis of both hips 10/18/2016   Primary osteoarthritis of both knees 10/18/2016   Chondromalacia, patella, unspecified laterality 10/18/2016   Bilateral calcaneal spurs 10/18/2016   Vitamin D deficiency 10/18/2016   UTI (urinary tract infection) 11/08/2014   Allergic rhinitis 02/21/2013   Migraine 02/21/2013    Past Medical History:  Diagnosis Date   A-fib (HCC)    Allergy    Diverticulitis    Fibromyalgia    POTS (postural orthostatic tachycardia syndrome)    per patient    Family History  Problem Relation Age of Onset   Asthma Mother    Heart Problems Father    Heart disease Brother    Scoliosis Son    Migraines Neg Hx    Headache Neg Hx    Past Surgical History:  Procedure Laterality Date   ABDOMINAL HYSTERECTOMY  CESAREAN SECTION     FINGER SURGERY     left ring finger x 2   FRACTURE SURGERY     TUBAL LIGATION     Social History   Social History Narrative   Not on file   Immunization History  Administered Date(s) Administered   PFIZER(Purple Top)SARS-COV-2 Vaccination 03/12/2020, 04/02/2020   Tdap 06/28/2018     Objective: Vital Signs: BP (!) 170/86 (BP Location: Left Arm, Patient Position: Sitting, Cuff Size:  Normal)   Pulse 80   Resp 14   Ht 5\' 3"  (1.6 m)   Wt 294 lb (133.4 kg)   LMP 04/02/2006   BMI 52.08 kg/m    Physical Exam Vitals and nursing note reviewed.  Constitutional:      Appearance: She is well-developed.  HENT:     Head: Normocephalic and atraumatic.  Eyes:     Conjunctiva/sclera: Conjunctivae normal.  Cardiovascular:     Rate and Rhythm: Normal rate and regular rhythm.     Heart sounds: Normal heart sounds.  Pulmonary:     Effort: Pulmonary effort is normal.     Breath sounds: Normal breath sounds.  Abdominal:     General: Bowel sounds are normal.     Palpations: Abdomen is soft.  Musculoskeletal:     Cervical back: Normal range of motion.  Skin:    General: Skin is warm and dry.     Capillary Refill: Capillary refill takes less than 2 seconds.  Neurological:     Mental Status: She is alert and oriented to person, place, and time.  Psychiatric:        Behavior: Behavior normal.      Musculoskeletal Exam: Generalized hyperalgesia and positive tender points on examination.  C-spine has good range of motion.  Trapezius muscle tension tenderness bilaterally.  Tenderness over the deltoid insertion site bilaterally.  Midline spinal tenderness in the lumbar region.  Shoulder joints, elbow joints, wrist joints, MCPs, PIPs, DIPs have good range of motion with no synovitis.  Complete fist formation bilaterally.  Hip joints have good range of motion with no groin pain.  Tenderness over bilateral trochanteric bursa.  Knee joints have good range of motion with discomfort bilaterally.  No warmth or effusion of knee joints noted. No ankle joint swelling.   CDAI Exam: CDAI Score: -- Patient Global: --; Provider Global: -- Swollen: --; Tender: -- Joint Exam 09/04/2022   No joint exam has been documented for this visit   There is currently no information documented on the homunculus. Go to the Rheumatology activity and complete the homunculus joint exam.  Investigation: No  additional findings.  Imaging: No results found.  Recent Labs: Lab Results  Component Value Date   WBC 7.0 05/19/2021   HGB 15.8 (H) 05/19/2021   PLT 368 05/19/2021   NA 140 05/19/2021   K 4.3 05/19/2021   CL 103 05/19/2021   CO2 25 05/19/2021   GLUCOSE 98 05/19/2021   BUN 7 05/19/2021   CREATININE 0.82 05/19/2021   BILITOT 1.1 05/19/2021   ALKPHOS 59 05/19/2021   AST 28 05/19/2021   ALT 29 05/19/2021   PROT 7.3 05/19/2021   ALBUMIN 4.0 05/19/2021   CALCIUM 10.0 05/19/2021   GFRAA >60 02/15/2020    Speciality Comments: No specialty comments available.  Procedures:  No procedures performed Allergies: Other, Penicillins, and Sulfa antibiotics   Assessment / Plan:     Visit Diagnoses: Fibromyalgia -She presents today with generalized hyperalgesia and positive tender points on examination.  She was last seen in the office on 05/29/2021.  She continues to experience intermittent flares.  She does not feel that her pain level has been adequately controlled at times.  She has been having increased discomfort in her lower back as well as generalized myalgias and muscle tenderness due to fibromyalgia. Patient continues to take methocarbamol 500 mg 1 tablet twice daily as needed for muscle spasms and tizanidine 4 mg at bedtime as needed.  She requested a refill of methocarbamol, Ambien, and tizanidine to be sent to the pharmacy.   Different treatment options were discussed today.  She is apprehensive to try medication such as Lyrica or gabapentin.  She previously tried Cymbalta but discontinued.  She has been trying to walk on a regular basis for exercise.  She plans on trying to continue to work on weight loss as well.  A referral to physical therapy was placed today to help better manage her symptoms.  She will follow-up in the office in 6 months or sooner if needed.  Plan: Ambulatory referral to Physical Therapy  Other fatigue: Chronic, secondary to insomnia.  Discussed the importance  of regular exercise.  Primary insomnia: She takes ambien 5 mg 1 tablet at bedtime as needed for insomnia.  Discussed the importance of good sleep hygiene.   Trochanteric bursitis of both hips - She continues to experience intermittent discomfort due to trochanter bursitis of both hips.  Discussed the importance of performing stretching exercises daily.  A referral to PT was placed today. Plan: Ambulatory referral to Physical Therapy  DDD (degenerative disc disease), lumbar -X-rays of the lumbar spine were consistent with lumbar facet arthropathy on 05/29/2021.  She continues to have discomfort in her lower back.  No symptoms of radiculopathy at this time.  She has some midline spinal tenderness in the lumbar region.  The plan was for her to be referred to physical therapy after her last office visit but the referral was never placed.  A new referral for physical therapy was placed today.  She was advised to notify us if she develops any new or worsening symptoms.  She plans on continuing to take methocarbamol and tizanidine as needed for muscle spasms.  Refills were sent to the pharmacy today.  Plan: Ambulatory referral to Physical Therapy  Primary osteoarthritis of both knees: Chronic pain.  Good range of motion of both knee joints with some discomfort and stiffness bilaterally.  No warmth or effusion of knee joints noted.  Bilateral calcaneal spurs: Discussed the importance of wearing proper fitting shoes.   Other medical conditions are listed as follows:   POTS (postural orthostatic tachycardia syndrome)  Vitamin D deficiency: She is taking vitamin D 10,000 units daily.   History of IBS  History of migraine  Orders: Orders Placed This Encounter  Procedures   Ambulatory referral to Physical Therapy   Meds ordered this encounter  Medications   tiZANidine (ZANAFLEX) 4 MG tablet    Sig: TAKE ONE TABLET BY MOUTH AT BEDTIME AS NEEDED FOR MUSCLE SPASM    Dispense:  90 tablet    Refill:  0    methocarbamol (ROBAXIN) 500 MG tablet    Sig: Take 1 tablet (500 mg total) by mouth 2 (two) times daily as needed for muscle spasms.    Dispense:  60 tablet    Refill:  2   zolpidem (AMBIEN) 5 MG tablet    Sig: Take 1 tablet (5 mg total) by mouth at bedtime as needed for sleep.  Dispense:  30 tablet    Refill:  0     Follow-Up Instructions: Return in about 6 months (around 03/05/2023) for Fibromyalgia, Osteoarthritis, DDD.   Gearldine Bienenstock, PA-C  Note - This record has been created using Dragon software.  Chart creation errors have been sought, but may not always  have been located. Such creation errors do not reflect on  the standard of medical care.

## 2022-09-05 ENCOUNTER — Ambulatory Visit: Payer: No Typology Code available for payment source | Admitting: Nurse Practitioner

## 2022-09-05 ENCOUNTER — Encounter: Payer: Self-pay | Admitting: Physician Assistant

## 2022-09-05 VITALS — BP 140/80 | HR 65 | Ht 63.0 in | Wt 293.0 lb

## 2022-09-05 DIAGNOSIS — Z13220 Encounter for screening for lipoid disorders: Secondary | ICD-10-CM

## 2022-09-05 DIAGNOSIS — Z6841 Body Mass Index (BMI) 40.0 and over, adult: Secondary | ICD-10-CM

## 2022-09-05 DIAGNOSIS — E785 Hyperlipidemia, unspecified: Secondary | ICD-10-CM | POA: Diagnosis not present

## 2022-09-05 DIAGNOSIS — Z7901 Long term (current) use of anticoagulants: Secondary | ICD-10-CM | POA: Diagnosis not present

## 2022-09-05 DIAGNOSIS — I480 Paroxysmal atrial fibrillation: Secondary | ICD-10-CM | POA: Diagnosis not present

## 2022-09-05 DIAGNOSIS — R0683 Snoring: Secondary | ICD-10-CM

## 2022-09-05 DIAGNOSIS — I10 Essential (primary) hypertension: Secondary | ICD-10-CM

## 2022-09-05 NOTE — Progress Notes (Signed)
Cardiology Office Note:    Date:  09/05/2022   ID:  MALILLANY KAZLAUSKAS, DOB 12-Mar-1966, MRN [id number]  PCP:  Lance Bosch, NP   [facility] Providers Cardiologist:  Little Ishikawa, MD     Referring MD: Lance Bosch, NP   CC: Here for PAF follow-up and recent ED visit  History of Present Illness:    Christine [name] is a 56 y.o. female with a hx of the following:   Paroxysmal atrial fibrillation, long term anticoagulation Migraine Fibromyalgia IBS HTN  Initially seen on February 23, 2020 and had a previous admission for chest pain and palpitations. 2 weeks prior to that, she was having episodes of tachycardia, heart rate has been as high as 200s.  Said this occurred with standing, felt lightheaded during the episodes. Was also having tachycardia at rest, said her heart felt like it was racing.  Denied any exercise, chest pain, or dyspnea.  In the ED, orthostatics showed increase in heart rate from 83-115 with standing, no hypotension.  Was given 2 L of IV fluid and orthostatics improved.  Troponins negative.  Labs unremarkable.  EKG unremarkable.  D-dimer negative.  Follow-up with PCP showed labs overall unremarkable.  Former smoker, positive family history of cardiovascular disease.  Previously used THC Gummies, had stopped to see the symptoms improved, but did not notice a difference.  TTE in 2021 showed LV systolic function was normal, mild MR, normal RV function, previous Zio patch did not reveal significant arrhythmias.  Normal Myoview in 2022. Presented to the ED in June 2022 with a chief complaint of palpitations, found to be in A-fib with RVR.  Spontaneously converted back to NSR.  Followed up in A-fib clinic after discharge.  CHA2DS2-VASc score was 1, not started on anticoagulation, prescribed diltiazem as needed.  Last seen by Dr. Bjorn Pippin in the office on July 17, 2021.  At that time she reported 1 episode of palpitations, she took her as needed diltiazem  and her symptoms resolved.  Stated she had chest pain with A-fib episodes, but denied any chest pain.  Overall doing well from a cardiac perspective.  BP well controlled.  For her report of snoring, Dr. Bjorn Pippin arranged a sleep study.  Eliquis 5 mg twice daily was started upon CHA2DS2-VASc score of 2 with Dr. Bjorn Pippin.  2D echocardiogram revealed LVEF greater than 75%, hyperdynamic function of left ventricle, and RWMA.  Mild LVH.  Mild MR.  Mild aortic valve sclerosis, no stenosis of aortic valve, all other findings were normal.  Heart monitor worn last year did not reveal any significant abnormalities.  Most recently seen in ED at [facility] on 08/30/22 with CC of palpitations, heart racing.  This event occurred late at night after she had spent the day at [facility], said she had been overheated majority of the day and was not well hydrated, had a mixed drink late that night (admits to drinking alcohol once in a blue moon), went to bed and noticed on her Apple Watch that HR got into 170's (her Apple Watch told her she might be in A-fib), no CP or SHOB. EKG on ER arrival revealed NSR, 94 bpm, normal axis without any ST-T wave abnormalities.  York Spaniel it took about 30 minutes or even longer for heart rate to get down into the 130s, says she was in the ED for several hours and was not able to pinpoint exactly how long the episode lasted. Says she rarely experiences episodes of A-fib, approximately  once per year.  Has not seen back in the A-fib clinic for over a year.  Says she has not heard anything regarding the sleep study that was arranged for her from previous visit.  Working on her physical activity, has a history of arthritis and fibromyalgia.  Wants to know if diltiazem is making her fibromyalgia worse. Denies any CP, SHOB, recurrent palpitations, dizziness, syncope, presyncope, orthopnea, PND, claudication. Chronic BLE.  Reports BP is well controlled at home.  Past Medical History:  Diagnosis Date    A-fib (HCC)    Allergy    Diverticulitis    Fibromyalgia    POTS (postural orthostatic tachycardia syndrome)    per patient    Past Surgical History:  Procedure Laterality Date   ABDOMINAL HYSTERECTOMY     CESAREAN SECTION     FINGER SURGERY     left ring finger x 2   FRACTURE SURGERY     TUBAL LIGATION      Current Medications: Current Meds  Medication Sig   diltiazem (CARDIZEM CD) 120 MG 24 hr capsule TAKE ONE CAPSULE BY MOUTH ONE TIME DAILY   ELIQUIS 5 MG TABS tablet TAKE ONE TABLET BY MOUTH TWICE A DAY   hydrOXYzine (VISTARIL) 25 MG capsule Take 25 mg by mouth as needed for anxiety.   hyoscyamine (LEVSIN SL) 0.125 MG SL tablet Place 0.125 mg under the tongue as needed for cramping.   Levomefolic Acid (5-MTHF) 1 MG CAPS Take by mouth daily.   MAGNESIUM PO Take 1 tablet by mouth at bedtime.   methocarbamol (ROBAXIN) 500 MG tablet Take 1 tablet (500 mg total) by mouth 2 (two) times daily as needed for muscle spasms.   pantoprazole (PROTONIX) 40 MG tablet Take 40 mg by mouth every other day.   tiZANidine (ZANAFLEX) 4 MG tablet TAKE ONE TABLET BY MOUTH AT BEDTIME AS NEEDED FOR MUSCLE SPASM   Vitamin D-Vitamin K (VITAMIN K2-VITAMIN D3 PO) Take by mouth daily.   zolpidem (AMBIEN) 5 MG tablet Take 1 tablet (5 mg total) by mouth at bedtime as needed for sleep.   [DISCONTINUED] cholecalciferol (VITAMIN D) 1000 UNITS tablet Take 10,000 Units by mouth at bedtime.      Allergies:   Other, Penicillins, and Sulfa antibiotics   Social History   Socioeconomic History   Marital status: Married    Spouse name: Not on file   Number of children: Not on file   Years of education: Not on file   Highest education level: Not on file  Occupational History   Occupation: Agricultural engineerAccountant    Employer: [employer]  Tobacco Use   Smoking status: Former    Packs/day: 0.25    Years: 2.00    Total pack years: 0.50    Types: Cigarettes    Quit date: 02/22/1988    Years since quitting: 34.5    Smokeless tobacco: Never  Vaping Use   Vaping Use: Never used  Substance and Sexual Activity   Alcohol use: Yes    Alcohol/week: 1.0 standard drink of alcohol    Types: 1 Glasses of wine per week    Comment: monthly   Drug use: No   Sexual activity: Yes    Birth control/protection: Surgical  Other Topics Concern   Not on file  Social History Narrative   Not on file   Social Determinants of Health   Financial Resource Strain: Not on file  Food Insecurity: Not on file  Transportation Needs: Not on file  Physical  Activity: Not on file  Stress: Not on file  Social Connections: Not on file     Family History: The patient's family history includes Asthma in her mother; Heart Problems in her father; Heart disease in her brother; Scoliosis in her son. There is no history of Migraines or Headache.  ROS:   Review of Systems  Constitutional: Negative.   HENT: Negative.    Eyes: Negative.   Cardiovascular:  Positive for palpitations and leg swelling. Negative for chest pain, orthopnea, claudication and PND.       Chronic leg edema.  History of palpitations with A-fib.  See HPI.  Gastrointestinal: Negative.   Genitourinary: Negative.   Musculoskeletal:  Positive for joint pain and neck pain. Negative for back pain, falls and myalgias.       See HPI.  Skin: Negative.   Neurological:  Negative for dizziness, tremors, sensory change, speech change, focal weakness, seizures, loss of consciousness, weakness and headaches.       History of fibromyalgia.  See HPI.  Endo/Heme/Allergies: Negative.   Psychiatric/Behavioral: Negative.      Please see the history of present illness.    All other systems reviewed and are negative.  EKGs/Labs/Other Studies Reviewed:    The following studies were reviewed today:   EKG:  EKG is ordered today.  The ekg ordered today demonstrates normal sinus rhythm, with sinus arrhythmia 65 bpm, no acute ST segment changes, nothing acute.   Recent  Labs: No results found for requested labs within last 365 days.  Recent Lipid Panel No results found for: "CHOL", "TRIG", "HDL", "CHOLHDL", "VLDL", "LDLCALC", "LDLDIRECT"   Risk Assessment/Calculations:    CHA2DS2-VASc Score = 2  This indicates a 2.2% annual risk of stroke. The patient's score is based upon: CHF History: 0 HTN History: 1 Diabetes History: 0 Stroke History: 0 Vascular Disease History: 0 Age Score: 0 Gender Score: 1      HYPERTENSION CONTROL Vitals:   09/05/22 0901 09/05/22 0920  BP: (!) 140/90 (!) 140/80    The patient's blood pressure is elevated above target today.  In order to address the patient's elevated BP: Blood pressure will be monitored at home to determine if medication changes need to be made.            Physical Exam:    VS:  BP (!) 140/80 (BP Location: Other (Comment), Patient Position: Sitting, Cuff Size: Normal) Comment (BP Location): right forearm  Pulse 65   Ht  (1.6 m)   Wt 293 lb (132.9 kg)   LMP 04/02/2006   BMI 51.90 kg/m     Wt Readings from Last 3 Encounters:  09/05/22 293 lb (132.9 kg)  09/04/22 294 lb (133.4 kg)  07/30/22 290 lb (131.5 kg)     GEN: Obese, 55 y.o. Caucasian female in no acute distress HEENT: Normal NECK: No JVD; No carotid bruits CARDIAC: RRR, no murmurs, rubs, gallops; 2+ peripheral pulses along radial pulses, strong and equal bilaterally.  1+ peripheral pulses along posterior tibials, equal bilaterally. RESPIRATORY:  Clear to auscultation without rales, wheezing or rhonchi  MUSCULOSKELETAL: Generalized edema; nonpitting, dependent edema along BLE; No deformity  SKIN: Warm and dry NEUROLOGIC:  Alert and oriented x 3 PSYCHIATRIC:  Normal, pleasant affect   ASSESSMENT:    1. Paroxysmal atrial fibrillation (HCC)   2. Current use of long term anticoagulation   3. Hyperlipidemia LDL goal <100   4. Screening for cholesterol level   5. Snoring   6. Class 3  severe obesity due to excess calories  with body mass index (BMI) of 50.0 to 59.9 in adult, unspecified whether serious comorbidity present ([place])   7. Hypertension, unspecified type    PLAN:    In order of problems listed above:  PAF, long term anticoagulation use - chronic, stable Recent episode of palpitations brought her to the ED.  She was unable to pinpoint how long this lasted, but estimated that this was probably over 30 minutes. Rarely has episodes of A-fib, approximately once per year. EKG today reveals SR with SA, 65 bpm.  I discussed and showed her how to send her Apple Watch reading to our clinic, and she verbalizes understanding. She is overdue for follow-up at A-fib clinic.  We will arrange this within the following month. Overall tolerating diltiazem very well. Will continue this for now.  Continue current medication regimen, and discussed with her she may take 1 extra capsule of diltiazem 120 mg daily PRN for palpitations.  On appropriate dosing of Eliquis.  Continue Eliquis 5 mg twice daily.   2.  Screening for cholesterol level, hyperlipidemia with LDL goal less than 100 Previous LDL reported at 117 by Dr. Gardiner Rhyme.  KPN does not reveal recent lipid profile or liver function enzymes.  She is fasting today we will arrange this for her.  If LDL is not less than 100, plan to initiate lipid-lowering medication, including low-dose statin.  3.  Snoring - chronic  Says she has not heard from any person regarding her sleep study.  We will reach out to sleep study specialist for assistance with getting this arranged for her.  4.  Class III severe obesity - chronic, not progressing BMI today 51.90.  Has gained 3 pounds since last office visit on July 30, 2022.  Says she is trying to increase her physical activity, including walking and obtaining an exercise machine at home. Weight loss via diet and exercise encouraged. Discussed the impact being overweight would have on cardiovascular risk.  5.  Hypertension- chronic, not at  goal BP today on arrival, 140/90, repeat BP 140/80.  Tells me today that BP is well controlled at home.  Average BP reading is 1 teens to 419Q systolic.  Given BP log and discussed to obtain Omron cuff and let me know readings within 2 weeks via MyChart.  If BP consistently remains 130 or greater systolic, plan to initiate metoprolol succinate.  6.  Disposition: Follow-up in A-fib clinic within 1 month, follow-up with Dr. Gardiner Rhyme in 6 months or sooner if anything changes.    Medication Adjustments/Labs and Tests Ordered: Current medicines are reviewed at length with the patient today.  Concerns regarding medicines are outlined above.  Orders Placed This Encounter  Procedures   Lipid panel   Hepatic function panel   EKG 12-Lead   No orders of the defined types were placed in this encounter.   Patient Instructions  Medication Instructions:  May take an extra tablet/capsule of Diltiazem as needed for palpitations   Your physician recommends that you continue on your current medications as directed. Please refer to the Current Medication list given to you today.  *If you need a refill on your cardiac medications before your next appointment, please call your pharmacy*  Lab Work: Your physician recommends that you return for lab work TODAY:  Fasting Lipid Panel Hepatic (Liver) Function Test   If you have labs (blood work) drawn today and your tests are completely normal, you will receive your results only by:  MyChart Message (if you have MyChart) OR A paper copy in the mail If you have any lab test that is abnormal or we need to change your treatment, we will call you to review the results.  Testing/Procedures: NONE ordered at this time of appointment   Follow-Up: At [facility], you and your health needs are our priority.  As part of our continuing mission to provide you with exceptional heart care, we have created designated Provider Care Teams.  These Care Teams  include your primary Cardiologist (physician) and Advanced Practice Providers (APPs -  Physician Assistants and Nurse Practitioners) who all work together to provide you with the care you need, when you need it.  Your next appointment:   1 month(s) 6 month(s)  The format for your next appointment:   In Person  In Person  Provider:   Afib Clinic  Little Ishikawa, MD     Other Instructions  Important Information About Sugar         Signed, Sharlene Dory, NP  09/05/2022 10:25 AM     [facility]

## 2022-09-05 NOTE — Patient Instructions (Signed)
Medication Instructions:  May take an extra tablet/capsule of Diltiazem as needed for palpitations   Your physician recommends that you continue on your current medications as directed. Please refer to the Current Medication list given to you today.  *If you need a refill on your cardiac medications before your next appointment, please call your pharmacy*  Lab Work: Your physician recommends that you return for lab work TODAY:  Fasting Lipid Panel Hepatic (Liver) Function Test   If you have labs (blood work) drawn today and your tests are completely normal, you will receive your results only by: MyChart Message (if you have MyChart) OR A paper copy in the mail If you have any lab test that is abnormal or we need to change your treatment, we will call you to review the results.  Testing/Procedures: NONE ordered at this time of appointment   Follow-Up: At [facility], you and your health needs are our priority.  As part of our continuing mission to provide you with exceptional heart care, we have created designated Provider Care Teams.  These Care Teams include your primary Cardiologist (physician) and Advanced Practice Providers (APPs -  Physician Assistants and Nurse Practitioners) who all work together to provide you with the care you need, when you need it.  Your next appointment:   1 month(s) 6 month(s)  The format for your next appointment:   In Person  In Person  Provider:   Afib Clinic  Donato Heinz, MD     Other Instructions  Important Information About Sugar

## 2022-09-11 NOTE — Telephone Encounter (Signed)
Noted. Thanks.

## 2022-10-07 ENCOUNTER — Encounter (HOSPITAL_COMMUNITY): Payer: Self-pay

## 2022-10-07 ENCOUNTER — Ambulatory Visit (HOSPITAL_COMMUNITY): Payer: No Typology Code available for payment source | Admitting: Physician Assistant

## 2022-10-28 ENCOUNTER — Other Ambulatory Visit: Payer: Self-pay | Admitting: Nurse Practitioner

## 2022-10-28 ENCOUNTER — Ambulatory Visit
Admission: RE | Admit: 2022-10-28 | Discharge: 2022-10-28 | Disposition: A | Discharge status: Home / Self Care | Payer: No Typology Code available for payment source | Source: Ambulatory Visit | Attending: Nurse Practitioner | Admitting: Nurse Practitioner

## 2022-10-28 ENCOUNTER — Encounter: Payer: Self-pay | Admitting: Nurse Practitioner

## 2022-10-28 DIAGNOSIS — R059 Cough, unspecified: Secondary | ICD-10-CM

## 2022-11-18 ENCOUNTER — Ambulatory Visit: Payer: No Typology Code available for payment source | Admitting: Pulmonary Disease

## 2022-11-18 ENCOUNTER — Encounter: Payer: Self-pay | Admitting: Pulmonary Disease

## 2022-11-18 VITALS — BP 140/72 | HR 100 | Temp 98.3°F | Ht 63.0 in | Wt 301.0 lb

## 2022-11-18 DIAGNOSIS — R059 Cough, unspecified: Secondary | ICD-10-CM | POA: Diagnosis not present

## 2022-11-18 NOTE — Progress Notes (Signed)
Christine [name]    [id number]    [birth year]  Primary Care Physician:Blevins, Sue Lush, NP  Referring Physician: Lance Bosch, NP [address] [place],  [place] [address]  Chief complaint: Consult for bronchitis, cough  HPI: 56 y.o. who  has a past medical history of A-fib (HCC), Allergy, Diverticulitis, Fibromyalgia, and POTS (postural orthostatic tachycardia syndrome).   She has been referred here for evaluation of cough, bronchitis.  She had an episode of pneumonia bronchitis in October 2023 treated with course of doxycycline, oral steroids, Tessalon and inhalers by primary care.  Follow-up chest x-ray on 10/28/2022 showed coarse interstitial markings and no any active cardiopulmonary disease  She has chronic cough, postnasal drip, allergies.  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  No feather pillows or comforters Smoking history: Minimal smoking as a teenager in the 1980s Travel history: Originally from [facility] [place].  No significant recent travel Relevant family history: No family history of lung disease  Outpatient Encounter Medications as of 11/18/2022  Medication Sig   diltiazem (CARDIZEM CD) 120 MG 24 hr capsule TAKE ONE CAPSULE BY MOUTH ONE TIME DAILY   ELIQUIS 5 MG TABS tablet TAKE ONE TABLET BY MOUTH TWICE A DAY   hydrOXYzine (VISTARIL) 25 MG capsule Take 25 mg by mouth as needed for anxiety.   hyoscyamine (LEVSIN SL) 0.125 MG SL tablet Place 0.125 mg under the tongue as needed for cramping.   Levomefolic Acid (5-MTHF) 1 MG CAPS Take by mouth daily.   MAGNESIUM PO Take 1 tablet by mouth at bedtime.   methocarbamol (ROBAXIN) 500 MG tablet Take 1 tablet (500 mg total) by mouth 2 (two) times daily as needed for muscle spasms.   pantoprazole (PROTONIX) 40 MG tablet Take 40 mg by mouth every other day.   tiZANidine (ZANAFLEX) 4 MG tablet  TAKE ONE TABLET BY MOUTH AT BEDTIME AS NEEDED FOR MUSCLE SPASM   Vitamin D-Vitamin K (VITAMIN K2-VITAMIN D3 PO) Take by mouth daily.   zolpidem (AMBIEN) 5 MG tablet Take 1 tablet (5 mg total) by mouth at bedtime as needed for sleep.   No facility-administered encounter medications on file as of 11/18/2022.    Allergies as of 11/18/2022 - Review Complete 11/18/2022  Allergen Reaction Noted   Other  06/28/2018   Penicillins Hives 07/28/2012   Sulfa antibiotics Hives 07/28/2012    Past Medical History:  Diagnosis Date   A-fib (HCC)    Allergy    Diverticulitis    Fibromyalgia    POTS (postural orthostatic tachycardia syndrome)    per patient    Past Surgical History:  Procedure Laterality Date   ABDOMINAL HYSTERECTOMY     CESAREAN SECTION     FINGER SURGERY     left ring finger x 2   FRACTURE SURGERY     TUBAL LIGATION      Family History  Problem Relation Age of Onset   Asthma Mother    Heart Problems Father    Heart disease Brother    Scoliosis Son    Migraines Neg Hx    Headache Neg Hx     Social History   Socioeconomic History   Marital status: Married    Spouse name: Not on file   Number of children: Not on file   Years of education: Not on file   Highest  education level: Not on file  Occupational History   Occupation: Aeronautical engineer: [employer]  Tobacco Use   Smoking status: Former    Packs/day: 0.25    Years: 2.00    Total pack years: 0.50    Types: Cigarettes    Quit date: 02/22/1988    Years since quitting: 34.7   Smokeless tobacco: Never  Vaping Use   Vaping Use: Never used  Substance and Sexual Activity   Alcohol use: Yes    Alcohol/week: 1.0 standard drink of alcohol    Types: 1 Glasses of wine per week    Comment: monthly   Drug use: No   Sexual activity: Yes    Birth control/protection: Surgical  Other Topics Concern   Not on file  Social History Narrative   Not on file   Social Determinants of Health   Financial  Resource Strain: Not on file  Food Insecurity: Not on file  Transportation Needs: Not on file  Physical Activity: Not on file  Stress: Not on file  Social Connections: Not on file  Intimate Partner Violence: Not on file    Review of systems: Review of Systems  Constitutional: Negative for fever and chills.  HENT: Negative.   Eyes: Negative for blurred vision.  Respiratory: as per HPI  Cardiovascular: Negative for chest pain and palpitations.  Gastrointestinal: Negative for vomiting, diarrhea, blood per rectum. Genitourinary: Negative for dysuria, urgency, frequency and hematuria.  Musculoskeletal: Negative for myalgias, back pain and joint pain.  Skin: Negative for itching and rash.  Neurological: Negative for dizziness, tremors, focal weakness, seizures and loss of consciousness.  Endo/Heme/Allergies: Negative for environmental allergies.  Psychiatric/Behavioral: Negative for depression, suicidal ideas and hallucinations.  All other systems reviewed and are negative.  Physical Exam: Blood pressure (!) 140/72, pulse 100, temperature 98.3 F (36.8 C), temperature source Oral, height 5\' 3"  (1.6 m), weight (!) 301 lb (136.5 kg), last menstrual period 04/02/2006, SpO2 98 %. Gen:      No acute distress HEENT:  EOMI, sclera anicteric Neck:     No masses; no thyromegaly Lungs:    Clear to auscultation bilaterally; normal respiratory effort CV:         Regular rate and rhythm; no murmurs Abd:      + bowel sounds; soft, non-tender; no palpable masses, no distension Ext:    No edema; adequate peripheral perfusion Skin:      Warm and dry; no rash Neuro: alert and oriented x 3 Psych: normal mood and affect  Data Reviewed: Imaging: Chest x-ray 10/28/2022-coarsened interstitial markings, no airspace disease I had reviewed the images personally  PFTs:  Labs:  Assessment:  Acute bronchitis Recovering from an episode of bronchitis.  She has albuterol inhaler and also sample of  Trelegy inhaler given by her primary care which she will try if symptoms are persistent Will schedule PFTs and return to clinic in 3 months  Plan/Recommendations: Inhalers as prescribed PFTs in 3 months  Marshell Garfinkel MD [facility] 11/18/2022, 11:15 AM  CC: Ferd Hibbs, NP

## 2022-11-18 NOTE — Patient Instructions (Signed)
We will schedule you for pulmonary function test in 3 months for evaluation of your lungs Continue the albuterol inhaler.  You already have a Trelegy prescription that he can try to see if it will make the cough better  Follow-up in 3 months after PFTs

## 2022-11-20 ENCOUNTER — Ambulatory Visit (HOSPITAL_COMMUNITY)
Admission: RE | Admit: 2022-11-20 | Discharge: 2022-11-20 | Disposition: A | Discharge status: Home / Self Care | Payer: 59 | Source: Ambulatory Visit | Attending: Physician Assistant | Admitting: Physician Assistant

## 2022-11-20 ENCOUNTER — Encounter (HOSPITAL_COMMUNITY): Payer: Self-pay | Admitting: Physician Assistant

## 2022-11-20 VITALS — BP 136/80 | HR 82 | Ht 63.0 in | Wt 300.6 lb

## 2022-11-20 DIAGNOSIS — E669 Obesity, unspecified: Secondary | ICD-10-CM | POA: Insufficient documentation

## 2022-11-20 DIAGNOSIS — M797 Fibromyalgia: Secondary | ICD-10-CM | POA: Insufficient documentation

## 2022-11-20 DIAGNOSIS — I480 Paroxysmal atrial fibrillation: Secondary | ICD-10-CM | POA: Diagnosis present

## 2022-11-20 DIAGNOSIS — I10 Essential (primary) hypertension: Secondary | ICD-10-CM | POA: Diagnosis not present

## 2022-11-20 DIAGNOSIS — Z6841 Body Mass Index (BMI) 40.0 and over, adult: Secondary | ICD-10-CM | POA: Diagnosis not present

## 2022-11-20 DIAGNOSIS — Z79899 Other long term (current) drug therapy: Secondary | ICD-10-CM | POA: Insufficient documentation

## 2022-11-20 DIAGNOSIS — Z7901 Long term (current) use of anticoagulants: Secondary | ICD-10-CM | POA: Insufficient documentation

## 2022-11-20 DIAGNOSIS — G4733 Obstructive sleep apnea (adult) (pediatric): Secondary | ICD-10-CM | POA: Insufficient documentation

## 2022-11-20 MED ORDER — FLECAINIDE ACETATE 50 MG PO TABS: 50.0000 mg | ORAL_TABLET | Freq: Two times a day (BID) | ORAL | 3 refills | Status: DC

## 2022-11-20 MED ORDER — DILTIAZEM HCL ER COATED BEADS 120 MG PO CP24: 120.0000 mg | ORAL_CAPSULE | Freq: Every day | ORAL | 4 refills | Status: DC

## 2022-11-20 NOTE — Progress Notes (Addendum)
Primary Care Physician: Lance Bosch, NP Primary Cardiologist: Dr Bjorn Pippin Primary Electrophysiologist: none Referring Physician: [facility]   Christine [name] is a 56 y.o. female with a history of fibromyalgia, IBS, OSA, HTN, and atrial fibrillation who presents for follow up in the [facility].  The patient was initially diagnosed with atrial fibrillation 05/19/21 after presenting to the ED with palpitations. She has had palpitations and chest pain off and on but workup thus far had not revealed any arrhythmias. ECG on presentation to the ED showed afib with RVR. She spontaneously converted to SR. Patient has a CHADS2VASC score of 1. She was given PRN diltiazem.   Patient presented to the ED at [facility] 08/30/22 with palpitations and heart racing. Her Apple Watch alerted her that she was in afib. She converted spontaneously to SR prior to evaluation at the ED.   On follow up today, patient reports that she feels well today, currently recovering from bronchitis. She has not had any afib episodes since her ED visit. No significant bleeding issues on anticoagulation.   Today, she denies symptoms of palpitations, chest pain, shortness of breath, orthopnea, PND, lower extremity edema, dizziness, presyncope, syncope, snoring, daytime somnolence, bleeding, or neurologic sequela. The patient is tolerating medications without difficulties and is otherwise without complaint today.    Atrial Fibrillation Risk Factors:  she does not have symptoms or diagnosis of sleep apnea. she does not have a history of rheumatic fever. she does not have a history of alcohol use. The patient does not have a history of early familial atrial fibrillation or other arrhythmias.  she has a BMI of Body mass index is 53.25 kg/m.Marland Kitchen Filed Weights   11/20/22 0830  Weight: (!) 136.4 kg    Family History  Problem Relation Age of Onset   Asthma Mother    Heart Problems Father     Heart disease Brother    Scoliosis Son    Migraines Neg Hx    Headache Neg Hx      Atrial Fibrillation Management history:  Previous antiarrhythmic drugs: none Previous cardioversions: none Previous ablations: none CHADS2VASC score: 2 Anticoagulation history: Eliquis   Past Medical History:  Diagnosis Date   A-fib (HCC)    Allergy    Diverticulitis    Fibromyalgia    POTS (postural orthostatic tachycardia syndrome)    per patient   Past Surgical History:  Procedure Laterality Date   ABDOMINAL HYSTERECTOMY     CESAREAN SECTION     FINGER SURGERY     left ring finger x 2   FRACTURE SURGERY     TUBAL LIGATION      Current Outpatient Medications  Medication Sig Dispense Refill   benzonatate (TESSALON) 200 MG capsule Take 200 mg by mouth 3 (three) times daily as needed.     diltiazem (CARDIZEM CD) 120 MG 24 hr capsule TAKE ONE CAPSULE BY MOUTH ONE TIME DAILY 30 capsule 1   ELIQUIS 5 MG TABS tablet TAKE ONE TABLET BY MOUTH TWICE A DAY 60 tablet 5   hydrOXYzine (VISTARIL) 25 MG capsule Take 25 mg by mouth as needed for anxiety.     hyoscyamine (LEVSIN SL) 0.125 MG SL tablet Place 0.125 mg under the tongue as needed for cramping.     Levomefolic Acid (5-MTHF) 1 MG CAPS Take by mouth daily.     MAGNESIUM PO Take 1 tablet by mouth at bedtime.     methocarbamol (ROBAXIN) 500 MG tablet Take  1 tablet (500 mg total) by mouth 2 (two) times daily as needed for muscle spasms. 60 tablet 2   pantoprazole (PROTONIX) 40 MG tablet Take 40 mg by mouth every other day.     tiZANidine (ZANAFLEX) 4 MG tablet TAKE ONE TABLET BY MOUTH AT BEDTIME AS NEEDED FOR MUSCLE SPASM 90 tablet 0   TRELEGY ELLIPTA 200-62.5-25 MCG/ACT AEPB Inhale 1 puff into the lungs daily.     Vitamin D-Vitamin K (VITAMIN K2-VITAMIN D3 PO) Take by mouth daily.     zolpidem (AMBIEN) 5 MG tablet Take 1 tablet (5 mg total) by mouth at bedtime as needed for sleep. 30 tablet 0   No current facility-administered medications for  this encounter.    Allergies  Allergen Reactions   Other     "an IBS med"   Penicillins Hives   Sulfa Antibiotics Hives    Social History   Socioeconomic History   Marital status: Married    Spouse name: Not on file   Number of children: Not on file   Years of education: Not on file   Highest education level: Not on file  Occupational History   Occupation: Airline pilot    Employer: [employer]  Tobacco Use   Smoking status: Former    Packs/day: 0.25    Years: 2.00    Total pack years: 0.50    Types: Cigarettes    Quit date: 02/22/1988    Years since quitting: 34.7   Smokeless tobacco: Never   Tobacco comments:    Former smoker 11/20/22  Vaping Use   Vaping Use: Never used  Substance and Sexual Activity   Alcohol use: Yes    Alcohol/week: 1.0 standard drink of alcohol    Types: 1 Glasses of wine per week    Comment: monthly   Drug use: No   Sexual activity: Yes    Birth control/protection: Surgical  Other Topics Concern   Not on file  Social History Narrative   Not on file   Social Determinants of Health   Financial Resource Strain: Not on file  Food Insecurity: Not on file  Transportation Needs: Not on file  Physical Activity: Not on file  Stress: Not on file  Social Connections: Not on file  Intimate Partner Violence: Not on file     ROS- All systems are reviewed and negative except as per the HPI above.  Physical Exam: Vitals:   11/20/22 0830  BP: 136/80  Pulse: 82  Weight: (!) 136.4 kg  Height: 5\' 3"  (1.6 m)     GEN- The patient is a well appearing obese female, alert and oriented x 3 today.   HEENT-head normocephalic, atraumatic, sclera clear, conjunctiva pink, hearing intact, trachea midline. Lungs- Clear to ausculation bilaterally, normal work of breathing Heart- Regular rate and rhythm, no murmurs, rubs or gallops  GI- soft, NT, ND, + BS Extremities- no clubbing, cyanosis, or edema MS- no significant deformity or atrophy Skin-  no rash or lesion Psych- euthymic mood, full affect Neuro- strength and sensation are intact   Wt Readings from Last 3 Encounters:  11/20/22 (!) 136.4 kg  11/18/22 (!) 136.5 kg  09/05/22 132.9 kg    EKG today demonstrates  SR Vent. rate 82 BPM PR interval 150 ms QRS duration 74 ms QT/QTcB 366/427 ms  Echo 08/06/21 demonstrated  1. Left ventricular ejection fraction, by estimation, is >75%. The left  ventricle has hyperdynamic function. The left ventricle has no regional  wall motion abnormalities. There  is mild left ventricular hypertrophy.  Left ventricular diastolic parameters were normal.   2. Right ventricular systolic function is normal. The right ventricular  size is normal.   3. Mild mitral valve regurgitation.   4. The aortic valve is tricuspid. Aortic valve regurgitation is not  visualized. Mild aortic valve sclerosis is present, with no evidence of  aortic valve stenosis.   5. The inferior vena cava is normal in size with greater than 50%  respiratory variability, suggesting right atrial pressure of 3 mmHg.   Comparison(s): The left ventricular function is unchanged.   Epic records are reviewed at length today  CHA2DS2-VASc Score = 2  The patient's score is based upon: CHF History: 0 HTN History: 1 Diabetes History: 0 Stroke History: 0 Vascular Disease History: 0 Age Score: 0 Gender Score: 1      ASSESSMENT AND PLAN: 1. Paroxysmal Atrial Fibrillation (ICD10:  I48.0) The patient's CHA2DS2-VASc score is 2, indicating a 2.2% annual risk of stroke.   Patient having infrequent episodes but she is highly symptomatic and very rapid when in afib.  We discussed rhythm control options today including increasing diltiazem vs starting AAD (flecainide, Multaq). After discussing the risks and benefits, will start flecainide 50 mg BID.  Continue diltiazem 120 mg daily  Continue Eliquis 5 mg BID. We discussed guidelines for anticoagulation and her CV score. She prefers  to continue Eliquis at this time.   2. Obesity Body mass index is 53.25 kg/m. Lifestyle modification was discussed and encouraged including regular physical activity and weight reduction.  3. OSA The importance of adequate treatment of sleep apnea was discussed today in order to improve our ability to maintain sinus rhythm long term. Patient will try to call medical equipment company again. Will also reach out to Dr Landry Dyke office to help coordinate.   4. HTN Stable, no changes today.   Follow up in the AF clinic next week.    [name] PA-C [facility] [address] [place], [place] [address] [phone] 11/20/2022 8:57 AM

## 2022-11-20 NOTE — Patient Instructions (Signed)
Start flecainide 50mg twice a day 

## 2022-11-22 ENCOUNTER — Telehealth: Payer: Self-pay

## 2022-11-22 NOTE — Telephone Encounter (Signed)
   Pre-operative Risk Assessment    Patient Name: MORGAN KEINATH  DOB: Oct 20, 1966 MRN: [id number]      Request for Surgical Clearance    Procedure:   Colonscopy/Endoscopy  Date of Surgery:  Clearance TBD                                 Surgeon:  Dr Ewing Schlein Surgeon's Group or Practice Name:  [facility] Phone number:  [phone] Fax number:  [phone]   Type of Clearance Requested:  eliquis hold   Type of Anesthesia:  Not Indicated   Additional requests/questions:  Please fax a copy of Cardiac clearance to the surgeon's office.  Signed, Tera Helper Chrystel Barefield  CCMA 11/22/2022, 4:23 PM

## 2022-11-22 NOTE — Telephone Encounter (Signed)
Patient with diagnosis of afib on Eliquis for anticoagulation.    Procedure: colonoscopy/endoscopy Date of procedure: TBD  CHA2DS2-VASc Score = 2  This indicates a 2.2% annual risk of stroke. The patient's score is based upon: CHF History: 0 HTN History: 1 Diabetes History: 0 Stroke History: 0 Vascular Disease History: 0 Age Score: 0 Gender Score: 1     CrCl 183mL/min Platelet count 276K  Per office protocol, patient can hold Eliquis for 1-2 days prior to procedure.    **This guidance is not considered finalized until pre-operative APP has relayed final recommendations.**

## 2022-11-22 NOTE — Telephone Encounter (Signed)
   Patient Name: Christine [name]  DOB: 22-Oct-1966 MRN: [id number]  Primary Cardiologist: Little Ishikawa, MD  Clinical pharmacists have reviewed the patient's past medical history, labs, and current medications as part of preoperative protocol coverage. The following recommendations have been made:  Patient with diagnosis of afib on Eliquis for anticoagulation.     Procedure: colonoscopy/endoscopy Date of procedure: TBD   CHA2DS2-VASc Score = 2  This indicates a 2.2% annual risk of stroke. The patient's score is based upon: CHF History: 0 HTN History: 1 Diabetes History: 0 Stroke History: 0 Vascular Disease History: 0 Age Score: 0 Gender Score: 1      CrCl 127mL/min Platelet count 276K   Per office protocol, patient can hold Eliquis for 1-2 days prior to procedure.  Please resume Eliquis as soon as possible postprocedure, at the discretion of the surgeon.   I will route this recommendation to the requesting party via Epic fax function and remove from pre-op pool.  Please call with questions.  Joylene Grapes, NP 11/22/2022, 4:40 PM

## 2022-11-27 ENCOUNTER — Ambulatory Visit (HOSPITAL_COMMUNITY)
Admission: RE | Admit: 2022-11-27 | Discharge: 2022-11-27 | Disposition: A | Discharge status: Home / Self Care | Payer: 59 | Source: Ambulatory Visit | Attending: Physician Assistant | Admitting: Physician Assistant

## 2022-11-27 DIAGNOSIS — I480 Paroxysmal atrial fibrillation: Secondary | ICD-10-CM | POA: Diagnosis not present

## 2022-11-27 MED ORDER — FLECAINIDE ACETATE 50 MG PO TABS: 50.0000 mg | ORAL_TABLET | Freq: Two times a day (BID) | ORAL | 1 refills | Status: DC

## 2022-11-27 NOTE — Progress Notes (Signed)
Patient returns for ECG after starting flecainide. ECG shows:  SR Vent. rate 85 BPM PR interval 156 ms QRS duration 78 ms QT/QTcB 366/435 ms  She reports she is doing well with the medication and has had no afib episodes. F/u in the AF clinic in 3 months.

## 2022-12-02 NOTE — Progress Notes (Unsigned)
No chief complaint on file.   HISTORY OF PRESENT ILLNESS:  12/02/22 ALL: Christine [name] returns for follow up for IIH. She was last seen 07/2022 and hesitant to start Diamox for IIH. She opted to work on [employer]. Since,    Eye exam?  07/30/2022 ALL: Christine [name] is a 56 y.o. female here today for follow up for IIH. She had an MRI 06/2022 showing concerns for papilledema. She was sent for LP showing opening pressure of 29. Diamox advised but she is hesitant. She has previous taken topiramate and did not like side effects. She reports tingling in extremities and fogginess. She is very hesitant to taking medicaitons. She has a sulfa allergy. She had "severe hives" after taking sulfa based abx for strep throat about 30 years ago. She reports headaches are significantly improved. She does have a headache, today, but correlates this to recent diverticulitis flare. She reports being dehydrated from vomiting. She is working on [employer]. She was able to lose 115 pounds when previously eating a Keto based diet. She has a hard time tolerating foods due to diverticulitis. She denies vision changes. She has an appt with Dr Clydene Laming this week.     HISTORY (copied from Dr Cathren Laine previous note)  HPI:  Christine [name] is a 56 y.o. female here as requested by Ferd Hibbs, NP for new onset headache.  Past medical history pseudotumor cerebri, fibromyalgia, A-fib, POTS, OSA, obesity, IBS, migraines.  I reviewed handwritten notes from Ferd Hibbs, last seen May 24, 2022, headaches for 2 weeks, cannot work, her eyes are twitching, blood pressure at home has been 108 over the 50s to 60s, takes magnesium gluconate daily, fibromyalgia worse with blood pressure medications, on Eliquis for A-fib, has been stressed at work.   She has had headaches in the past, this is not similar, she has migraines, these headaches are different. They started 3 weeks ago. They feel consistent headache, in the  forehead from temple to temple. Her blood pressure has been lower. She was started on prednisone and had side effects so she had to stop. Nothing happened when the headache started, she was diagnosed with sleep apnea but has not treated it with either oral device or cpap. She was ordered a cpap. Consistent headache. Throughout the day. Laying down helps. Positional, worse standing, she have noticed some minimal vision changes, not exertional, vision has been blurrier than normal, she is due to get an eye exam in one month. No pulsating/pounding/throbbing or other significant migrainous featShe denies any stress, any focal neurologic symptoms. No jaw pain or other signs/symptoms of GCA and esr/crp normal. No other focal neurologic deficits, associated symptoms, inciting events or modifiable factors.   Reviewed notes, labs and imaging from outside physicians, which showed:   Reviewed blood work May 25, 2022, CBC normal, CMP unremarkable with creatinine 1.05 and BUN 17, TSH normal, sed rate 37 normal, CRP 6 normal   MRI brain 04/02/2021: IMPRESSION:  No intrinsic pathology of the orbits or globes.  Question slight  prominence of cerebrospinal fluid in the optic nerve sheaths.  This  can be seen with increased intracranial pressure/pseudotumor.  The  finding is not definitely pathologic.   REVIEW OF SYSTEMS: Out of a complete 14 system review of symptoms, the patient complains only of the following symptoms, headaches, abdominal pain, muscle and joint pain and all other reviewed systems are negative.   ALLERGIES: Allergies  Allergen Reactions   Other     "an  IBS med"   Penicillins Hives   Sulfa Antibiotics Hives     HOME MEDICATIONS: Outpatient Medications Prior to Visit  Medication Sig Dispense Refill   benzonatate (TESSALON) 200 MG capsule Take 200 mg by mouth 3 (three) times daily as needed.     diltiazem (CARDIZEM CD) 120 MG 24 hr capsule Take 1 capsule (120 mg total) by mouth daily. 30  capsule 4   ELIQUIS 5 MG TABS tablet TAKE ONE TABLET BY MOUTH TWICE A DAY 60 tablet 5   flecainide (TAMBOCOR) 50 MG tablet Take 1 tablet (50 mg total) by mouth 2 (two) times daily. 180 tablet 1   hydrOXYzine (VISTARIL) 25 MG capsule Take 25 mg by mouth as needed for anxiety.     hyoscyamine (LEVSIN SL) 0.125 MG SL tablet Place 0.125 mg under the tongue as needed for cramping.     Levomefolic Acid (5-MTHF) 1 MG CAPS Take by mouth daily.     MAGNESIUM PO Take 1 tablet by mouth at bedtime.     methocarbamol (ROBAXIN) 500 MG tablet Take 1 tablet (500 mg total) by mouth 2 (two) times daily as needed for muscle spasms. 60 tablet 2   pantoprazole (PROTONIX) 40 MG tablet Take 40 mg by mouth every other day.     tiZANidine (ZANAFLEX) 4 MG tablet TAKE ONE TABLET BY MOUTH AT BEDTIME AS NEEDED FOR MUSCLE SPASM 90 tablet 0   TRELEGY ELLIPTA 200-62.5-25 MCG/ACT AEPB Inhale 1 puff into the lungs daily.     Vitamin D-Vitamin K (VITAMIN K2-VITAMIN D3 PO) Take by mouth daily.     zolpidem (AMBIEN) 5 MG tablet Take 1 tablet (5 mg total) by mouth at bedtime as needed for sleep. 30 tablet 0   No facility-administered medications prior to visit.     PAST MEDICAL HISTORY: Past Medical History:  Diagnosis Date   A-fib ([name])    Allergy    Diverticulitis    Fibromyalgia    POTS (postural orthostatic tachycardia syndrome)    per patient     PAST SURGICAL HISTORY: Past Surgical History:  Procedure Laterality Date   ABDOMINAL HYSTERECTOMY     CESAREAN SECTION     FINGER SURGERY     left ring finger x 2   FRACTURE SURGERY     TUBAL LIGATION       FAMILY HISTORY: Family History  Problem Relation Age of Onset   Asthma Mother    Heart Problems Father    Heart disease Brother    Scoliosis Son    Migraines Neg Hx    Headache Neg Hx      SOCIAL HISTORY: Social History   Socioeconomic History   Marital status: Married    Spouse name: Not on file   Number of children: Not on file   Years of  education: Not on file   Highest education level: Not on file  Occupational History   Occupation: Aeronautical engineer: [employer]  Tobacco Use   Smoking status: Former    Packs/day: 0.25    Years: 2.00    Total pack years: 0.50    Types: Cigarettes    Quit date: 02/22/1988    Years since quitting: 34.8   Smokeless tobacco: Never   Tobacco comments:    Former smoker 11/20/22  Vaping Use   Vaping Use: Never used  Substance and Sexual Activity   Alcohol use: Yes    Alcohol/week: 1.0 standard drink of alcohol    Types:  1 Glasses of wine per week    Comment: monthly   Drug use: No   Sexual activity: Yes    Birth control/protection: Surgical  Other Topics Concern   Not on file  Social History Narrative   Not on file   Social Determinants of Health   Financial Resource Strain: Not on file  Food Insecurity: Not on file  Transportation Needs: Not on file  Physical Activity: Not on file  Stress: Not on file  Social Connections: Not on file  Intimate Partner Violence: Not on file     PHYSICAL EXAM  There were no vitals filed for this visit.  There is no height or weight on file to calculate BMI.  Generalized: Well developed, in no acute distress  Cardiology: normal rate and rhythm, no murmur auscultated  Respiratory: clear to auscultation bilaterally    Neurological examination  Mentation: Alert oriented to time, place, history taking. Follows all commands speech and language fluent Cranial nerve II-XII: Pupils were equal round reactive to light. Extraocular movements were full, visual field were full on confrontational test. Facial sensation and strength were normal. Head turning and shoulder shrug  were normal and symmetric. Motor: The motor testing reveals 5 over 5 strength of all 4 extremities. Good symmetric motor tone is noted throughout.  Gait and station: Gait is normal.    DIAGNOSTIC DATA (LABS, IMAGING, TESTING) - I reviewed patient records, labs,  notes, testing and imaging myself where available.  Lab Results  Component Value Date   WBC 7.0 05/19/2021   HGB 15.8 (H) 05/19/2021   HCT 48.0 (H) 05/19/2021   MCV 89.7 05/19/2021   PLT 368 05/19/2021      Component Value Date/Time   NA 140 05/19/2021 1947   K 4.3 05/19/2021 1947   CL 103 05/19/2021 1947   CO2 25 05/19/2021 1947   GLUCOSE 98 05/19/2021 1947   BUN 7 05/19/2021 1947   CREATININE 0.82 05/19/2021 1947   CALCIUM 10.0 05/19/2021 1947   PROT 7.3 05/19/2021 1947   ALBUMIN 4.0 05/19/2021 1947   AST 28 05/19/2021 1947   ALT 29 05/19/2021 1947   ALKPHOS 59 05/19/2021 1947   BILITOT 1.1 05/19/2021 1947   GFRNONAA >60 05/19/2021 1947   GFRAA >60 02/15/2020 1631   No results found for: "CHOL", "HDL", "LDLCALC", "LDLDIRECT", "TRIG", "CHOLHDL" No results found for: "HGBA1C" No results found for: "VITAMINB12" No results found for: "TSH"      No data to display               No data to display           ASSESSMENT AND PLAN  56 y.o. year old female  has a past medical history of A-fib ([place]), Allergy, Diverticulitis, Fibromyalgia, and POTS (postural orthostatic tachycardia syndrome). here with    No diagnosis found.  Rendy J Fritts reports headaches have improved since last visit. We have discussed MRI and LP results. She has an allergy to sulfa and prefers to avoid acetazolamide. We have discussed extended release topiramate, however, she is very hesitant. She would like to discuss with her PCP and at her eye exam pending results. I have provided her additional information in her AVS. Weight management encouraged. Healthy lifestyle habits encouraged. She will follow up with PCP as directed. She will return to see me in 4-6 months, sooner if needed. She verbalizes understanding and agreement with this plan.    No orders of the defined types were placed in  this encounter.    No orders of the defined types were placed in this encounter.    Debbora Presto, MSN, FNP-C 12/02/2022, 1:40 PM  [facility] [address] [place], [place] [address] [phone]

## 2022-12-02 NOTE — Patient Instructions (Signed)
Below is our plan:  We will continue to monitor. Please watch weight closely. Please follow up with your provider for CPAP management. Please keep an eye on your BP at home.   Please make sure you are staying well hydrated. I recommend 50-60 ounces daily. Well balanced diet and regular exercise encouraged. Consistent sleep schedule with 6-8 hours recommended.   Please continue follow up with care team as directed.   Follow up with me in 1 year, sooner if needed   You may receive a survey regarding today's visit. I encourage you to leave honest feed back as I do use this information to improve patient care. Thank you for seeing me today!

## 2022-12-04 ENCOUNTER — Ambulatory Visit (INDEPENDENT_AMBULATORY_CARE_PROVIDER_SITE_OTHER): Payer: 59 | Admitting: Family Medicine

## 2022-12-04 ENCOUNTER — Encounter: Payer: Self-pay | Admitting: Family Medicine

## 2022-12-04 VITALS — BP 165/88 | HR 85 | Ht 63.0 in | Wt 304.0 lb

## 2022-12-04 DIAGNOSIS — G932 Benign intracranial hypertension: Secondary | ICD-10-CM

## 2022-12-18 ENCOUNTER — Other Ambulatory Visit: Payer: Self-pay | Admitting: Cardiology

## 2022-12-18 ENCOUNTER — Other Ambulatory Visit: Payer: Self-pay | Admitting: Physician Assistant

## 2022-12-18 DIAGNOSIS — I480 Paroxysmal atrial fibrillation: Secondary | ICD-10-CM

## 2022-12-19 ENCOUNTER — Encounter (HOSPITAL_BASED_OUTPATIENT_CLINIC_OR_DEPARTMENT_OTHER): Payer: Self-pay | Admitting: Cardiology

## 2022-12-19 NOTE — Telephone Encounter (Signed)
Next Visit: 03/11/2023  Last Visit: 09/04/2022  Last Fill: 09/04/2022  Dx: Fibromyalgia   Current Dose per office note on 09/04/2022: tizanidine 4 mg at bedtime as needed.   Okay to refill Tizanidine?

## 2022-12-19 NOTE — Telephone Encounter (Signed)
Prescription refill request for Eliquis received. Indication: Afib  Last office visit: 11/20/22 Marlene Lard)  Scr: 0.87 (08/30/22)  Age: 57 Weight: 137.9kg  Appropriate dose and refill sent to requested pharmacy

## 2022-12-20 NOTE — Telephone Encounter (Addendum)
Call to discuss risk vs benefit of using NSAIDs with DOAC.  N/A LVM to call back.   Patient called back reported that her dentist want her to take naproxen twice daily for at least 15 -30 days   Few dose of NSAIDs may be ok with DOAC with close supervision on bleeding but chronic use of NSAID's with DOAC increases bleeding risk, especially naproxen, along with platelet lowering effects it may inhibit p-glycoprotein, thereby increasing apixaban absorption and systemic apixaban exposure.   Advised patient to reach out to Dentist for other safe options.

## 2023-01-09 HISTORY — PX: UPPER GI ENDOSCOPY: SHX6162

## 2023-01-09 HISTORY — PX: COLONOSCOPY: SHX174

## 2023-01-20 ENCOUNTER — Other Ambulatory Visit: Payer: Self-pay | Admitting: Physician Assistant

## 2023-01-20 NOTE — Telephone Encounter (Signed)
Next Visit: 03/10/2023  Last Visit: 09/04/2022  Last Fill: 09/04/2022  DX: Fibromyalgia, Primary insomnia   Current Dose per office note on 09/04/2022: She takes ambien 5 mg 1 tablet at bedtime as needed for insomnia. methocarbamol 500 mg 1 tablet twice daily as needed for muscle spasms   Okay to refill ambien and robaxin?

## 2023-02-25 NOTE — Progress Notes (Deleted)
Office Visit Note  Patient: Christine [name]             Date of Birth: 01-12-1966           MRN: [id number]             PCP: Ferd Hibbs, NP Referring: Ferd Hibbs, NP Visit Date: 03/10/2023 Occupation: '@GUAROCC'$ @  Subjective:  No chief complaint on file.   History of Present Illness: Christine [name] is a 57 y.o. female ***     Activities of Daily Living:  Patient reports morning stiffness for *** {minute/hour:19697}.   Patient {ACTIONS;DENIES/REPORTS:21021675::"Denies"} nocturnal pain.  Difficulty dressing/grooming: {ACTIONS;DENIES/REPORTS:21021675::"Denies"} Difficulty climbing stairs: {ACTIONS;DENIES/REPORTS:21021675::"Denies"} Difficulty getting out of chair: {ACTIONS;DENIES/REPORTS:21021675::"Denies"} Difficulty using hands for taps, buttons, cutlery, and/or writing: {ACTIONS;DENIES/REPORTS:21021675::"Denies"}  No Rheumatology ROS completed.   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   Trochanteric bursitis of both hips 10/18/2016   Primary osteoarthritis of both knees 10/18/2016   Chondromalacia, patella, unspecified laterality 10/18/2016   Bilateral calcaneal spurs 10/18/2016   Vitamin D deficiency 10/18/2016   UTI (urinary tract infection) 11/08/2014   Allergic rhinitis 02/21/2013   Migraine 02/21/2013    Past Medical History:  Diagnosis Date   A-fib ([place])    Allergy    Diverticulitis    Fibromyalgia    POTS (postural orthostatic tachycardia syndrome)    per patient    Family History  Problem Relation Age of Onset   Asthma Mother    Heart Problems Father    Heart disease Brother    Scoliosis Son    Migraines Neg Hx    Headache Neg Hx    Past Surgical History:  Procedure  Laterality Date   ABDOMINAL HYSTERECTOMY     CESAREAN SECTION     FINGER SURGERY     left ring finger x 2   FRACTURE SURGERY     TUBAL LIGATION     Social History   Social History Narrative   Not on file   Immunization History  Administered Date(s) Administered   PFIZER(Purple Top)SARS-COV-2 Vaccination 03/12/2020, 04/02/2020   Tdap 06/28/2018     Objective: Vital Signs: LMP 04/02/2006    Physical Exam   Musculoskeletal Exam: ***  CDAI Exam: CDAI Score: -- Patient Global: --; Provider Global: -- Swollen: --; Tender: -- Joint Exam 03/10/2023   No joint exam has been documented for this visit   There is currently no information documented on the homunculus. Go to the Rheumatology activity and complete the homunculus joint exam.  Investigation: No additional findings.  Imaging: No results found.  Recent Labs: Lab Results  Component Value Date   WBC 7.0 05/19/2021   HGB 15.8 (H) 05/19/2021   PLT 368 05/19/2021   NA 140 05/19/2021   K 4.3 05/19/2021   CL 103 05/19/2021   CO2 25 05/19/2021   GLUCOSE 98 05/19/2021   BUN 7 05/19/2021   CREATININE 0.82 05/19/2021   BILITOT 1.1 05/19/2021   ALKPHOS 59 05/19/2021   AST 28 05/19/2021   ALT 29 05/19/2021   PROT 7.3 05/19/2021   ALBUMIN 4.0 05/19/2021   CALCIUM 10.0 05/19/2021   GFRAA >60 02/15/2020    Speciality Comments: No specialty comments available.  Procedures:  No procedures performed Allergies: Other,  Penicillins, and Sulfa antibiotics   Assessment / Plan:     Visit Diagnoses: Fibromyalgia  Other fatigue  Primary insomnia  Trochanteric bursitis of both hips  DDD (degenerative disc disease), lumbar  Primary osteoarthritis of both knees  Bilateral calcaneal spurs  POTS (postural orthostatic tachycardia syndrome)  Vitamin D deficiency  History of IBS  History of migraine  Orders: No orders of the defined types were placed in this encounter.  No orders of the defined types were  placed in this encounter.   Face-to-face time spent with patient was *** minutes. Greater than 50% of time was spent in counseling and coordination of care.  Follow-Up Instructions: No follow-ups on file.   Ofilia Neas, PA-C  Note - This record has been created using Dragon software.  Chart creation errors have been sought, but may not always  have been located. Such creation errors do not reflect on  the standard of medical care.

## 2023-02-26 ENCOUNTER — Ambulatory Visit (HOSPITAL_COMMUNITY)
Admission: RE | Admit: 2023-02-26 | Discharge: 2023-02-26 | Disposition: A | Discharge status: Home / Self Care | Payer: 59 | Source: Ambulatory Visit | Attending: Physician Assistant | Admitting: Physician Assistant

## 2023-02-26 ENCOUNTER — Encounter (HOSPITAL_COMMUNITY): Payer: Self-pay | Admitting: Physician Assistant

## 2023-02-26 VITALS — BP 132/80 | HR 77 | Ht 63.0 in | Wt 305.0 lb

## 2023-02-26 DIAGNOSIS — I10 Essential (primary) hypertension: Secondary | ICD-10-CM | POA: Diagnosis not present

## 2023-02-26 DIAGNOSIS — I480 Paroxysmal atrial fibrillation: Secondary | ICD-10-CM | POA: Diagnosis present

## 2023-02-26 DIAGNOSIS — G4733 Obstructive sleep apnea (adult) (pediatric): Secondary | ICD-10-CM | POA: Insufficient documentation

## 2023-02-26 DIAGNOSIS — Z6841 Body Mass Index (BMI) 40.0 and over, adult: Secondary | ICD-10-CM | POA: Insufficient documentation

## 2023-02-26 NOTE — Progress Notes (Signed)
Primary Care Physician: Ferd Hibbs, NP Primary Cardiologist: Dr Gardiner Rhyme Primary Electrophysiologist: none Referring Physician: [facility]   Christine [name] is a 57 y.o. female with a history of fibromyalgia, IBS, OSA, HTN, and atrial fibrillation who presents for follow up in the [facility].  The patient was initially diagnosed with atrial fibrillation 05/19/21 after presenting to the ED with palpitations. She has had palpitations and chest pain off and on but workup thus far had not revealed any arrhythmias. ECG on presentation to the ED showed afib with RVR. She spontaneously converted to SR. Patient has a CHADS2VASC score of 1. She was given PRN diltiazem.   Patient presented to the ED at [facility] 08/30/22 with palpitations and heart racing. Her Apple Watch alerted her that she was in afib. She converted spontaneously to SR prior to evaluation at the ED. She was started on flecainide 11/20/22.  On follow up today, patient reports that she has done well from a cardiac standpoint. She has rare palpitations last only a few seconds, usually when she steps out into cold weather. No sustained episodes. No bleeding issues on anticoagulation. She has started using her CPAP.   Today, she denies symptoms of chest pain, shortness of breath, orthopnea, PND, lower extremity edema, dizziness, presyncope, syncope, bleeding, or neurologic sequela. The patient is tolerating medications without difficulties and is otherwise without complaint today.    Atrial Fibrillation Risk Factors:  she does have symptoms or diagnosis of sleep apnea. She is using her CPAP. she does not have a history of rheumatic fever. she does not have a history of alcohol use. The patient does not have a history of early familial atrial fibrillation or other arrhythmias.  she has a BMI of Body mass index is 54.03 kg/m.Marland Kitchen Filed Weights   02/26/23 0825  Weight: (!) 138.3 kg    Family  History  Problem Relation Age of Onset   Asthma Mother    Heart Problems Father    Heart disease Brother    Scoliosis Son    Migraines Neg Hx    Headache Neg Hx      Atrial Fibrillation Management history:  Previous antiarrhythmic drugs: flecainide  Previous cardioversions: none Previous ablations: none CHADS2VASC score: 2 Anticoagulation history: Eliquis   Past Medical History:  Diagnosis Date   A-fib ([place])    Allergy    Diverticulitis    Fibromyalgia    POTS (postural orthostatic tachycardia syndrome)    per patient   Past Surgical History:  Procedure Laterality Date   ABDOMINAL HYSTERECTOMY     CESAREAN SECTION     FINGER SURGERY     left ring finger x 2   FRACTURE SURGERY     TUBAL LIGATION      Current Outpatient Medications  Medication Sig Dispense Refill   benzonatate (TESSALON) 200 MG capsule Take 200 mg by mouth 3 (three) times daily as needed.     diltiazem (CARDIZEM CD) 120 MG 24 hr capsule Take 1 capsule (120 mg total) by mouth daily. 30 capsule 4   ELIQUIS 5 MG TABS tablet TAKE ONE TABLET BY MOUTH TWICE A DAY 60 tablet 5   flecainide (TAMBOCOR) 50 MG tablet Take 1 tablet (50 mg total) by mouth 2 (two) times daily. 180 tablet 1   hydrOXYzine (VISTARIL) 25 MG capsule Take 25 mg by mouth as needed for anxiety.     hyoscyamine (LEVSIN SL) 0.125 MG SL tablet Place 0.125 mg under  the tongue as needed for cramping.     Levomefolic Acid (5-MTHF) 1 MG CAPS Take by mouth daily.     MAGNESIUM PO Take 1 tablet by mouth at bedtime.     methocarbamol (ROBAXIN) 500 MG tablet TAKE ONE TABLET BY MOUTH TWICE A DAY AS NEEDED FOR MUSCLE SPASM 60 tablet 2   pantoprazole (PROTONIX) 40 MG tablet Take 40 mg by mouth every other day.     tiZANidine (ZANAFLEX) 4 MG tablet TAKE ONE TABLET BY MOUTH AT BEDTIME AS NEEDED FOR MUSCLE SPASM 90 tablet 0   TRELEGY ELLIPTA 200-62.5-25 MCG/ACT AEPB Inhale 1 puff into the lungs daily.     Vitamin D-Vitamin K (VITAMIN K2-VITAMIN D3 PO)  Take by mouth daily.     zolpidem (AMBIEN) 5 MG tablet TAKE ONE TABLET BY MOUTH AT BEDTIME AS NEEDED FOR SLEEP 30 tablet 0   No current facility-administered medications for this encounter.    Allergies  Allergen Reactions   Other     "an IBS med"   Penicillins Hives   Sulfa Antibiotics Hives    Social History   Socioeconomic History   Marital status: Married    Spouse name: Not on file   Number of children: Not on file   Years of education: Not on file   Highest education level: Not on file  Occupational History   Occupation: Optometrist    Employer: [employer]  Tobacco Use   Smoking status: Former    Packs/day: 0.25    Years: 2.00    Total pack years: 0.50    Types: Cigarettes    Quit date: 02/22/1988    Years since quitting: 35.0   Smokeless tobacco: Never   Tobacco comments:    Former smoker 11/20/22  Vaping Use   Vaping Use: Never used  Substance and Sexual Activity   Alcohol use: Yes    Alcohol/week: 1.0 standard drink of alcohol    Types: 1 Glasses of wine per week    Comment: monthly   Drug use: No   Sexual activity: Yes    Birth control/protection: Surgical  Other Topics Concern   Not on file  Social History Narrative   Not on file   Social Determinants of Health   Financial Resource Strain: Not on file  Food Insecurity: Not on file  Transportation Needs: Not on file  Physical Activity: Not on file  Stress: Not on file  Social Connections: Not on file  Intimate Partner Violence: Not on file     ROS- All systems are reviewed and negative except as per the HPI above.  Physical Exam: Vitals:   02/26/23 0825  BP: 132/80  Pulse: 77  Weight: (!) 138.3 kg  Height: '5\' 3"'$  (1.6 m)    GEN- The patient is a well appearing female, alert and oriented x 3 today.   HEENT-head normocephalic, atraumatic, sclera clear, conjunctiva pink, hearing intact, trachea midline. Lungs- Clear to ausculation bilaterally, normal work of breathing Heart-  Regular rate and rhythm, no murmurs, rubs or gallops  GI- soft, NT, ND, + BS Extremities- no clubbing, cyanosis, or edema MS- no significant deformity or atrophy Skin- no rash or lesion Psych- euthymic mood, full affect Neuro- strength and sensation are intact   Wt Readings from Last 3 Encounters:  02/26/23 (!) 138.3 kg  12/04/22 (!) 137.9 kg  11/20/22 (!) 136.4 kg    EKG today demonstrates  SR, PAC Vent. rate 77 BPM PR interval 176 ms QRS duration 78  ms QT/QTcB 392/443 ms  Echo 08/06/21 demonstrated  1. Left ventricular ejection fraction, by estimation, is >75%. The left  ventricle has hyperdynamic function. The left ventricle has no regional  wall motion abnormalities. There is mild left ventricular hypertrophy.  Left ventricular diastolic parameters were normal.   2. Right ventricular systolic function is normal. The right ventricular  size is normal.   3. Mild mitral valve regurgitation.   4. The aortic valve is tricuspid. Aortic valve regurgitation is not  visualized. Mild aortic valve sclerosis is present, with no evidence of  aortic valve stenosis.   5. The inferior vena cava is normal in size with greater than 50%  respiratory variability, suggesting right atrial pressure of 3 mmHg.   Comparison(s): The left ventricular function is unchanged.   Epic records are reviewed at length today  CHA2DS2-VASc Score = 2  The patient's score is based upon: CHF History: 0 HTN History: 1 Diabetes History: 0 Stroke History: 0 Vascular Disease History: 0 Age Score: 0 Gender Score: 1      ASSESSMENT AND PLAN: 1. Paroxysmal Atrial Fibrillation (ICD10:  I48.0) The patient's CHA2DS2-VASc score is 2, indicating a 2.2% annual risk of stroke.   Patient appears to be maintaining SR.  Continue flecainide 50 mg BID Continue diltiazem 120 mg daily  Continue Eliquis 5 mg BID  2. Obesity Body mass index is 54.03 kg/m. Lifestyle modification was discussed and encouraged  including regular physical activity and weight reduction.  3. OSA Encouraged compliance with CPAP therapy.  Will have her see Dr Claiborne Billings for routine follow up.   4. HTN Stable, no changes today.   Follow up with Dr Gardiner Rhyme per recall. AF clinic in 6 months.    [facility] [address] [place], [place] [address] [phone] 02/26/2023 8:57 AM

## 2023-03-10 ENCOUNTER — Ambulatory Visit: Payer: Self-pay | Admitting: Physician Assistant

## 2023-03-10 DIAGNOSIS — M7061 Trochanteric bursitis, right hip: Secondary | ICD-10-CM

## 2023-03-10 DIAGNOSIS — M7731 Calcaneal spur, right foot: Secondary | ICD-10-CM

## 2023-03-10 DIAGNOSIS — M5136 Other intervertebral disc degeneration, lumbar region: Secondary | ICD-10-CM

## 2023-03-10 DIAGNOSIS — G90A Postural orthostatic tachycardia syndrome (POTS): Secondary | ICD-10-CM

## 2023-03-10 DIAGNOSIS — Z8669 Personal history of other diseases of the nervous system and sense organs: Secondary | ICD-10-CM

## 2023-03-10 DIAGNOSIS — M797 Fibromyalgia: Secondary | ICD-10-CM

## 2023-03-10 DIAGNOSIS — E559 Vitamin D deficiency, unspecified: Secondary | ICD-10-CM

## 2023-03-10 DIAGNOSIS — F5101 Primary insomnia: Secondary | ICD-10-CM

## 2023-03-10 DIAGNOSIS — Z8719 Personal history of other diseases of the digestive system: Secondary | ICD-10-CM

## 2023-03-10 DIAGNOSIS — M170 Bilateral primary osteoarthritis of knee: Secondary | ICD-10-CM

## 2023-03-10 DIAGNOSIS — R5383 Other fatigue: Secondary | ICD-10-CM

## 2023-03-11 ENCOUNTER — Ambulatory Visit: Payer: No Typology Code available for payment source | Admitting: Rheumatology

## 2023-03-12 NOTE — Progress Notes (Unsigned)
Office Visit Note  Patient: Christine CabalBettie J Gayton             Date of Birth: 06/12/1966           MRN: [id number]             PCP: Lance BoschBlevins, Andrea, NP Referring: Lance BoschBlevins, Andrea, NP Visit Date: 03/26/2023 Occupation: @GUAROCC @  Subjective:  Right arm pain   History of Present Illness: Christine [name] is a 57 y.o. female with history of fibromyalgia.  She experiences intermittent myalgias and muscle tenderness due to fibromyalgia.  Patient reports that in January 2024 she underwent an endoscopy and colonoscopy.  She states that while at the procedure one of the nurses tried to reposition her right arm which elicited pain which has been persistent.  She has not been further evaluated for the right arm pain she has been experiencing.  She has difficulty lifting her grandson or even carrying her purse on the right side.  She denies any joint swelling at this time.  She has tried taking Tylenol as needed for pain relief.  She is taking methocarbamol 500 mg 1 tablet twice daily and tizanidine 4 mg at bedtime for muscle spasms.  She takes Ambien 5 mg at bedtime as needed for insomnia. She has been trying to increase her walking regimen has been using a rebounder for exercise.  Activities of Daily Living:  Patient reports morning stiffness for 5 minutes.   Patient Reports nocturnal pain.  Difficulty dressing/grooming: Denies Difficulty climbing stairs: Denies Difficulty getting out of chair: Denies Difficulty using hands for taps, buttons, cutlery, and/or writing: Denies  Review of Systems  Constitutional:  Positive for fatigue.  HENT:  Negative for mouth sores and mouth dryness.   Eyes:  Negative for dryness.  Respiratory:  Negative for shortness of breath.   Cardiovascular:  Negative for chest pain and palpitations.  Gastrointestinal:  Negative for blood in stool, constipation and diarrhea.  Endocrine: Negative for increased urination.  Genitourinary:  Negative for involuntary urination.   Musculoskeletal:  Positive for joint pain, joint pain, myalgias, muscle weakness, morning stiffness, muscle tenderness and myalgias. Negative for gait problem and joint swelling.  Skin:  Positive for hair loss. Negative for color change, rash and sensitivity to sunlight.  Allergic/Immunologic: Negative for susceptible to infections.  Neurological:  Negative for dizziness and headaches.  Hematological:  Negative for swollen glands.  Psychiatric/Behavioral:  Positive for sleep disturbance. Negative for depressed mood. The patient is not nervous/anxious.     PMFS History:  Patient Active Problem List   Diagnosis Date Noted   Snoring 09/27/2021   Paroxysmal atrial fibrillation 05/24/2021   IBS (irritable bowel syndrome) 10/18/2016   Fibromyalgia 10/18/2016   Fatigue 10/18/2016   Primary insomnia 10/18/2016   Osteoarthritis of lumbar spine 10/18/2016   Trochanteric bursitis of both hips 10/18/2016   Primary osteoarthritis of both knees 10/18/2016   Chondromalacia, patella, unspecified laterality 10/18/2016   Bilateral calcaneal spurs 10/18/2016   Vitamin D deficiency 10/18/2016   UTI (urinary tract infection) 11/08/2014   Allergic rhinitis 02/21/2013   Migraine 02/21/2013    Past Medical History:  Diagnosis Date   A-fib    Allergy    Diverticulitis    Fibromyalgia    POTS (postural orthostatic tachycardia syndrome)    per patient    Family History  Problem Relation Age of Onset   Asthma Mother    Heart Problems Father    Heart disease Brother    Scoliosis Son  Migraines Neg Hx    Headache Neg Hx    Past Surgical History:  Procedure Laterality Date   ABDOMINAL HYSTERECTOMY     CESAREAN SECTION     COLONOSCOPY  01/09/2023   FINGER SURGERY     left ring finger x 2   FRACTURE SURGERY     TUBAL LIGATION     UPPER GI ENDOSCOPY  01/09/2023   Social History   Social History Narrative   Not on file   Immunization History  Administered Date(s) Administered    PFIZER(Purple Top)SARS-COV-2 Vaccination 03/12/2020, 04/02/2020   Tdap 06/28/2018     Objective: Vital Signs: BP (!) 154/91 (BP Location: Left Wrist, Patient Position: Sitting, Cuff Size: Normal)   Pulse 74   Resp 18   Ht 5\' 3"  (1.6 m)   Wt (!) 309 lb 3.2 oz (140.3 kg)   LMP 04/02/2006   BMI 54.77 kg/m    Physical Exam Vitals and nursing note reviewed.  Constitutional:      Appearance: She is well-developed.  HENT:     Head: Normocephalic and atraumatic.  Eyes:     Conjunctiva/sclera: Conjunctivae normal.  Cardiovascular:     Rate and Rhythm: Normal rate and regular rhythm.     Heart sounds: Normal heart sounds.  Pulmonary:     Effort: Pulmonary effort is normal.     Breath sounds: Normal breath sounds.  Abdominal:     General: Bowel sounds are normal.     Palpations: Abdomen is soft.  Musculoskeletal:     Cervical back: Normal range of motion.  Lymphadenopathy:     Cervical: No cervical adenopathy.  Skin:    General: Skin is warm and dry.     Capillary Refill: Capillary refill takes less than 2 seconds.  Neurological:     Mental Status: She is alert and oriented to person, place, and time.  Psychiatric:        Behavior: Behavior normal.      Musculoskeletal Exam: Generalized hyperalgesia and positive tender points on examination.  C-spine has good range of motion.  Trapezius muscle tension and tenderness bilaterally.  Tenderness over the right deltoid insertion site.  Tenderness over the right lateral epicondyle.  Good range of motion of both shoulder joints noted.  Elbow joints have good range of motion with no tenderness along the elbow joint line.  Wrist joints, MCPs, PIPs, DIPs have good range of motion with no synovitis.  Complete fist formation bilaterally.  Hip joints have good range of motion with no groin pain.  No tenderness over the trochanteric bursa at this time.  Knee joints have good range of motion with mild discomfort in the right knee.  No warmth or  effusion noted in the knee joints.  Ankle joints have good range of motion with no joint tenderness.  CDAI Exam: CDAI Score: -- Patient Global: --; Provider Global: -- Swollen: --; Tender: -- Joint Exam 03/26/2023   No joint exam has been documented for this visit   There is currently no information documented on the homunculus. Go to the Rheumatology activity and complete the homunculus joint exam.  Investigation: No additional findings.  Imaging: No results found.  Recent Labs: Lab Results  Component Value Date   WBC 7.0 05/19/2021   HGB 15.8 (H) 05/19/2021   PLT 368 05/19/2021   NA 140 05/19/2021   K 4.3 05/19/2021   CL 103 05/19/2021   CO2 25 05/19/2021   GLUCOSE 98 05/19/2021   BUN 7 05/19/2021  CREATININE 0.82 05/19/2021   BILITOT 1.1 05/19/2021   ALKPHOS 59 05/19/2021   AST 28 05/19/2021   ALT 29 05/19/2021   PROT 7.3 05/19/2021   ALBUMIN 4.0 05/19/2021   CALCIUM 10.0 05/19/2021   GFRAA >60 02/15/2020    Speciality Comments: No specialty comments available.  Procedures:  No procedures performed Allergies: Other, Penicillins, and Sulfa antibiotics   Assessment / Plan:     Visit Diagnoses: Fibromyalgia: She has generalized hyperalgesia and positive tender points on examination.  She has been taking methocarbamol 500 mg twice daily and tizanidine 4 mg at bedtime as needed for muscle spasms.  Patient presents today with trapezius muscle tension and tenderness bilaterally.  She has tenderness over the right deltoid insertion site as well as over the right lateral epicondyle.  In January 2024 she underwent a colonoscopy and endoscopy at which time she had an IV.  She states that the nurse had to reposition her arm due to IV and since then has had ongoing discomfort in her right arm.  She has good range of motion of the right shoulder joint on examination today.  She has good flexion extension of the right elbow with no tenderness along the right elbow joint line.   Discussed that her symptoms are consistent with right lateral epicondylitis.  Different treatment options were discussed.  She is given a handout of exercises to perform and was encouraged to use Voltaren gel topically for pain relief.  If her symptoms persist or worsen she can return for a right lateral condyle cortisone injection in the future.  Referral to physical therapy can also be placed if needed. Discussed the importance of regular exercise, good sleep hygiene, and stress management.  She will follow-up in the office in 6 months or sooner if needed.  Lateral epicondylitis, right elbow: She has tenderness palpation over the right lateral epicondyle.  She has good range of motion of the right elbow with no tenderness along the elbow joint line.  No olecranon bursitis or inflammation was noted.  Different treatment options were discussed today including physical therapy, home exercises, cortisone injection, and the use of topical agents.  I also discussed the use of a tennis elbow brace.  She plans on applying Voltaren gel topically for pain relief and if her symptoms worsen she will return for a right lateral epicondyle cortisone injection.  Other fatigue: Chronic, stable.  Discussed the importance of regular exercise and good sleep hygiene.  I also discussed the importance of stress management.  Primary insomnia -She takes ambien 5 mg 1 tablet at bedtime as needed for insomnia.  Patient avoids taking Ambien if she takes tizanidine at bedtime.  Discussed the importance of good sleep hygiene.  Trochanteric bursitis of both hips: Asymptomatic currently.  Patient has been performing stretching exercises on a regular basis.  DDD (degenerative disc disease), lumbar -Patient was referred to physical therapy after her last office visit but was unable to schedule a visit due to being diagnosed with pneumonia followed by bronchitis which took a full 2 months to recover from.  She will notify us if she would  like a new referral to physical therapy placed.  She has been trying to increase her activity level and has been performing stretching exercises.  She takes methocarbamol and tizanidine as needed for muscle spasms.  She has no symptoms of radiculopathy at this time.  Primary osteoarthritis of both knees: Good range of motion of both knee joints on examination today.  No  warmth or effusion noted.  Her right knee joint discomfort has improved.  She has been using a rebounder and has been walking for exercise.  She plans on continuing to work on weight loss.  Bilateral calcaneal spurs: She is wearing proper fitting shoes.  Other medical conditions are listed as follows:  POTS (postural orthostatic tachycardia syndrome)  Vitamin D deficiency: Taking a daily supplement.  History of IBS  History of migraine  Orders: No orders of the defined types were placed in this encounter.  No orders of the defined types were placed in this encounter.    Follow-Up Instructions: Return in about 6 months (around 09/25/2023) for Fibromyalgia.   Gearldine Bienenstockaylor M Rondalyn Belford, PA-C  Note - This record has been created using Dragon software.  Chart creation errors have been sought, but may not always  have been located. Such creation errors do not reflect on  the standard of medical care.

## 2023-03-26 ENCOUNTER — Ambulatory Visit: Payer: 59 | Attending: Rheumatology | Admitting: Physician Assistant

## 2023-03-26 ENCOUNTER — Encounter: Payer: Self-pay | Admitting: Physician Assistant

## 2023-03-26 VITALS — BP 154/91 | HR 74 | Resp 18 | Ht 63.0 in | Wt 309.2 lb

## 2023-03-26 DIAGNOSIS — M7731 Calcaneal spur, right foot: Secondary | ICD-10-CM

## 2023-03-26 DIAGNOSIS — M7061 Trochanteric bursitis, right hip: Secondary | ICD-10-CM

## 2023-03-26 DIAGNOSIS — Z8719 Personal history of other diseases of the digestive system: Secondary | ICD-10-CM

## 2023-03-26 DIAGNOSIS — F5101 Primary insomnia: Secondary | ICD-10-CM

## 2023-03-26 DIAGNOSIS — R5383 Other fatigue: Secondary | ICD-10-CM | POA: Diagnosis not present

## 2023-03-26 DIAGNOSIS — M170 Bilateral primary osteoarthritis of knee: Secondary | ICD-10-CM

## 2023-03-26 DIAGNOSIS — M7711 Lateral epicondylitis, right elbow: Secondary | ICD-10-CM

## 2023-03-26 DIAGNOSIS — M797 Fibromyalgia: Secondary | ICD-10-CM

## 2023-03-26 DIAGNOSIS — G90A Postural orthostatic tachycardia syndrome (POTS): Secondary | ICD-10-CM

## 2023-03-26 DIAGNOSIS — Z8669 Personal history of other diseases of the nervous system and sense organs: Secondary | ICD-10-CM

## 2023-03-26 DIAGNOSIS — M7732 Calcaneal spur, left foot: Secondary | ICD-10-CM

## 2023-03-26 DIAGNOSIS — M7062 Trochanteric bursitis, left hip: Secondary | ICD-10-CM

## 2023-03-26 DIAGNOSIS — M5136 Other intervertebral disc degeneration, lumbar region: Secondary | ICD-10-CM

## 2023-03-26 DIAGNOSIS — E559 Vitamin D deficiency, unspecified: Secondary | ICD-10-CM

## 2023-03-26 NOTE — Patient Instructions (Signed)
[facility] Ask your health care provider which exercises are safe for you. Do exercises exactly as told by your health care provider and adjust them as directed. It is normal to feel mild stretching, pulling, tightness, or discomfort as you do these exercises. Stop right away if you feel sudden pain or your pain gets worse. Do not begin these exercises until told by your health care provider. Stretching and range-of-motion exercises These exercises warm up your muscles and joints and improve the movement and flexibility of your elbow. Wrist flexion, assisted  Straighten your left / right elbow in front of you with your palm facing down toward the floor. If told by your health care provider, bend your left / right elbow to a 90-degree angle (right angle) at your side instead of holding it straight. With your other hand, gently push over the back of your left / right hand so your fingers point toward the floor (flexion). Stop when you feel a gentle stretch on the back of your forearm. Hold this position for __________ seconds. Repeat __________ times. Complete this exercise __________ times a day. Wrist extension, assisted  Straighten your left / right elbow in front of you with your palm facing up toward the ceiling. If told by your health care provider, bend your left / right elbow to a 90-degree angle (right angle) at your side instead of holding it straight. With your other hand, gently pull your left / right hand and fingers toward the floor (extension). Stop when you feel a gentle stretch on the palm side of your forearm. Hold this position for __________ seconds. Repeat __________ times. Complete this exercise __________ times a day. Assisted forearm rotation, supination Sit or stand with your elbows at your side. Bend your left / right elbow to a 90-degree angle (right angle). Using your uninjured hand, turn your left / right palm up toward the ceiling (supination) until you feel a  gentle stretch along the inside of your forearm. Hold this position for __________ seconds. Repeat __________ times. Complete this exercise __________ times a day. Assisted forearm rotation, pronation Sit or stand with your elbows at your side. Bend your left / right elbow to a 90-degree angle (right angle). Using your uninjured hand, turn your left / right palm down toward the floor (pronation) until you feel a gentle stretch along the outside of your forearm. Hold this position for __________ seconds. Repeat __________ times. Complete this exercise __________ times a day. Strengthening exercises These exercises build strength and endurance in your forearm and elbow. Endurance is the ability to use your muscles for a long time, even after they get tired. Radial deviation  Stand with a __________ weight or a hammer in your left / right hand. Or, sit while holding a rubber exercise band or tubing, with your left / right forearm supported on a table or countertop. Position your forearm so that the thumb is facing the ceiling, as if you are going to clap your hands. This is the neutral position. Raise your hand upward in front of you so your thumb moves toward the ceiling (radial deviation), or pull up on the rubber tubing. Keep your forearm and elbow still while you move your wrist only. Hold this position for __________ seconds. Slowly return to the starting position. Repeat __________ times. Complete this exercise __________ times a day. Wrist extension, eccentric Sit with your left / right forearm palm-down and supported on a table or other surface. Let your left /   right wrist extend over the edge of the surface. Hold a __________ weight or a piece of exercise band or tubing in your left / right hand. If using a rubber exercise band or tubing, hold the other end of the tubing with your other hand. Use your uninjured hand to move your left / right hand up toward the ceiling. Take your  uninjured hand away and slowly return to the starting position using only your left / right hand. Lowering your arm under tension is called eccentric extension. Repeat __________ times. Complete this exercise __________ times a day. Wrist extension Do not do this exercise if it causes pain at the outside of your elbow. Only do this exercise once instructed by your health care provider. Sit with your left / right forearm supported on a table or other surface and your palm turned down toward the floor. Let your left / right wrist extend over the edge of the surface. Hold a __________ weight or a piece of rubber exercise band or tubing. If you are using a rubber exercise band or tubing, hold the band or tubing in place with your other hand to provide resistance. Slowly bend your wrist so your hand moves up toward the ceiling (extension). Move only your wrist, keeping your forearm and elbow still. Hold this position for __________ seconds. Slowly return to the starting position. Repeat __________ times. Complete this exercise __________ times a day. Forearm rotation, supination To do this exercise, you will need a lightweight hammer or rubber mallet. Sit with your left / right forearm supported on a table or other surface. Bend your elbow to a 90-degree angle (right angle). Position your forearm so that your palm is facing down toward the floor, with your hand resting over the edge of the table. Hold a hammer in your left / right hand. To make this exercise easier, hold the hammer near the head of the hammer. To make this exercise harder, hold the hammer near the end of the handle. Without moving your wrist or elbow, slowly rotate your forearm so your palm faces up toward the ceiling (supination). Hold this position for __________ seconds. Slowly return to the starting position. Repeat __________ times. Complete this exercise __________ times a day. Shoulder blade squeeze Sit in a stable chair or  stand with good posture. If you are sitting down, do not let your back touch the back of the chair. Your arms should be at your sides with your elbows bent to a 90-degree angle (right angle). Position your forearms so that your thumbs are facing the ceiling (neutral position). Without lifting your shoulders up, squeeze your shoulder blades tightly together. Hold this position for __________ seconds. Slowly release and return to the starting position. Repeat __________ times. Complete this exercise __________ times a day. This information is not intended to replace advice given to you by your health care provider. Make sure you discuss any questions you have with your health care provider. Document Revised: 02/23/2020 Document Reviewed: 02/23/2020 Elsevier Patient Education  2023 [employer]  

## 2023-04-11 ENCOUNTER — Other Ambulatory Visit (HOSPITAL_COMMUNITY): Payer: Self-pay | Admitting: Physician Assistant

## 2023-04-14 ENCOUNTER — Other Ambulatory Visit: Payer: Self-pay | Admitting: Rheumatology

## 2023-04-15 NOTE — Telephone Encounter (Signed)
Last Fill: 01/20/2023 of ambien 12/19/2022 of tizanidine  Next Visit: 09/25/2023  Last Visit: 03/26/2023  DX: Fibromyalgia, Primary insomnia   Current Dose per office note on 03/26/2023: ambien 5 mg 1 tablet at bedtime as needed for insomnia. tizanidine 4 mg at bedtime as needed for muscle spasms.   Okay to refill ambien and tizanidine?

## 2023-06-04 ENCOUNTER — Other Ambulatory Visit (HOSPITAL_COMMUNITY): Payer: Self-pay | Admitting: Physician Assistant

## 2023-06-06 ENCOUNTER — Ambulatory Visit: Payer: 59 | Admitting: Cardiovascular Disease

## 2023-06-17 ENCOUNTER — Encounter (HOSPITAL_BASED_OUTPATIENT_CLINIC_OR_DEPARTMENT_OTHER): Payer: 59

## 2023-06-20 ENCOUNTER — Other Ambulatory Visit: Payer: Self-pay | Admitting: Rheumatology

## 2023-06-20 NOTE — Telephone Encounter (Signed)
Last Fill: 01/20/2023  Next Visit: 09/25/2023  Last Visit: 03/26/2023  DX: Fibromyalgia   Current Dose per office note on 03/26/2023: methocarbamol 500 mg twice daily   Okay to refill robaxin?

## 2023-06-24 ENCOUNTER — Ambulatory Visit: Payer: 59 | Admitting: Pulmonary Disease

## 2023-06-30 ENCOUNTER — Other Ambulatory Visit: Payer: Self-pay | Admitting: Rheumatology

## 2023-07-01 NOTE — Telephone Encounter (Signed)
Last Fill: 04/15/2023  Next Visit: 09/25/2023  Last Visit: 03/26/2023  Dx:  Fibromyalgia   Current Dose per office note on 03/26/2023:  tizanidine 4 mg at bedtime as needed for muscle spasms.   Okay to refill Tizanidine?

## 2023-07-16 ENCOUNTER — Other Ambulatory Visit: Payer: Self-pay | Admitting: Cardiology

## 2023-07-16 DIAGNOSIS — I480 Paroxysmal atrial fibrillation: Secondary | ICD-10-CM

## 2023-07-16 NOTE — Telephone Encounter (Signed)
Prescription refill request for Eliquis received. Indication: PAF Last office visit: 02/26/23  C Fenton PA Scr: 0.87 on 08/30/22  Epic Age: 57 Weight: 138.3kg  Based on above findings Eliquis 5mg  twice daily is the appropriate dose.  Refills approved.

## 2023-09-04 ENCOUNTER — Encounter (HOSPITAL_COMMUNITY): Payer: Self-pay | Admitting: Physician Assistant

## 2023-09-04 ENCOUNTER — Ambulatory Visit (HOSPITAL_COMMUNITY)
Admission: RE | Admit: 2023-09-04 | Discharge: 2023-09-04 | Disposition: A | Discharge status: Home / Self Care | Payer: 59 | Source: Ambulatory Visit | Attending: Physician Assistant | Admitting: Physician Assistant

## 2023-09-04 VITALS — BP 128/82 | HR 76 | Ht 63.0 in | Wt 308.6 lb

## 2023-09-04 DIAGNOSIS — Z6841 Body Mass Index (BMI) 40.0 and over, adult: Secondary | ICD-10-CM | POA: Diagnosis not present

## 2023-09-04 DIAGNOSIS — I10 Essential (primary) hypertension: Secondary | ICD-10-CM | POA: Diagnosis not present

## 2023-09-04 DIAGNOSIS — M797 Fibromyalgia: Secondary | ICD-10-CM | POA: Diagnosis not present

## 2023-09-04 DIAGNOSIS — I480 Paroxysmal atrial fibrillation: Secondary | ICD-10-CM | POA: Diagnosis not present

## 2023-09-04 DIAGNOSIS — G4733 Obstructive sleep apnea (adult) (pediatric): Secondary | ICD-10-CM | POA: Insufficient documentation

## 2023-09-04 DIAGNOSIS — E669 Obesity, unspecified: Secondary | ICD-10-CM | POA: Diagnosis not present

## 2023-09-04 DIAGNOSIS — Z7901 Long term (current) use of anticoagulants: Secondary | ICD-10-CM | POA: Insufficient documentation

## 2023-09-04 DIAGNOSIS — Z79899 Other long term (current) drug therapy: Secondary | ICD-10-CM | POA: Insufficient documentation

## 2023-09-04 MED ORDER — METOPROLOL SUCCINATE ER 25 MG PO TB24: 12.5000 mg | ORAL_TABLET | Freq: Every day | ORAL | 6 refills | Status: DC

## 2023-09-04 NOTE — Patient Instructions (Signed)
Stop cardizem   Start metoprolol 1/2 tablet once a day (12.5mg  daily)

## 2023-09-04 NOTE — Progress Notes (Signed)
Primary Care Physician: Lance Bosch, NP Primary Cardiologist: Dr Bjorn Pippin Primary Electrophysiologist: none Referring Physician: [facility]   Christine [name] is a 57 y.o. female with a history of fibromyalgia, IBS, OSA, HTN, and atrial fibrillation who presents for follow up in the [facility].  The patient was initially diagnosed with atrial fibrillation 05/19/21 after presenting to the ED with palpitations. She has had palpitations and chest pain off and on but workup thus far had not revealed any arrhythmias. ECG on presentation to the ED showed afib with RVR. She spontaneously converted to SR. Patient has a CHADS2VASC score of 1. She was given PRN diltiazem.   Patient presented to the ED at [facility] 08/30/22 with palpitations and heart racing. Her Apple Watch alerted her that she was in afib. She converted spontaneously to SR prior to evaluation at the ED. She was started on flecainide 11/20/22.  On follow up today, patient reports that she has done well from an afib standpoint. However, she has had 4 episodes of low BP and lightheadedness. She drinks coffee/fluids and her symptoms improve. No bleeding issues on anticoagulation.   Today, she denies symptoms of palpitations, chest pain, shortness of breath, orthopnea, PND, lower extremity edema, presyncope, syncope, bleeding, or neurologic sequela. The patient is tolerating medications without difficulties and is otherwise without complaint today.    Atrial Fibrillation Risk Factors:  she does have symptoms or diagnosis of sleep apnea. she does not have a history of rheumatic fever. she does not have a history of alcohol use. The patient does not have a history of early familial atrial fibrillation or other arrhythmias.   Atrial Fibrillation Management history:  Previous antiarrhythmic drugs: flecainide  Previous cardioversions: none Previous ablations: none Anticoagulation history:  Eliquis   Past Medical History:  Diagnosis Date   A-fib (HCC)    Allergy    Diverticulitis    Fibromyalgia    POTS (postural orthostatic tachycardia syndrome)    per patient   Current Outpatient Medications  Medication Sig Dispense Refill   ELIQUIS 5 MG TABS tablet TAKE ONE TABLET BY MOUTH TWICE A DAY 60 tablet 5   flecainide (TAMBOCOR) 50 MG tablet TAKE ONE TABLET BY MOUTH TWICE A DAY 180 tablet 1   hyoscyamine (LEVSIN SL) 0.125 MG SL tablet Place 0.125 mg under the tongue as needed for cramping.     Levomefolic Acid (5-MTHF) 1 MG CAPS Take by mouth daily.     MAGNESIUM PO Take 1 tablet by mouth at bedtime.     methocarbamol (ROBAXIN) 500 MG tablet TAKE ONE TABLET BY MOUTH TWICE A DAY AS NEEDED FOR MUSCLE SPASM 60 tablet 2   metoprolol succinate (TOPROL XL) 25 MG 24 hr tablet Take 0.5 tablets (12.5 mg total) by mouth daily. 30 tablet 6   Multiple Vitamin (MULTIVITAMIN PO) Take by mouth daily.     pantoprazole (PROTONIX) 40 MG tablet Take 40 mg by mouth every other day.     tiZANidine (ZANAFLEX) 4 MG tablet TAKE ONE TABLET BY MOUTH AT BEDTIME AS NEEDED FOR MUSCLE SPASM 90 tablet 0   Vitamin D-Vitamin K (VITAMIN K2-VITAMIN D3 PO) Take by mouth daily.     zolpidem (AMBIEN) 5 MG tablet TAKE ONE TABLET BY MOUTH AT BEDTIME AS NEEDED FOR SLEEP 30 tablet 0   No current facility-administered medications for this encounter.    ROS- All systems are reviewed and negative except as per the HPI above.  Physical Exam: Vitals:  09/04/23 0828  BP: 128/82  Pulse: 76  Weight: (!) 140 kg  Height: 5\' 3"  (1.6 m)     GEN: Well nourished, well developed in no acute distress NECK: No JVD; No carotid bruits CARDIAC: Regular rate and rhythm, no murmurs, rubs, gallops RESPIRATORY:  Clear to auscultation without rales, wheezing or rhonchi  ABDOMEN: Soft, non-tender, non-distended EXTREMITIES:  No edema; No deformity    Wt Readings from Last 3 Encounters:  09/04/23 (!) 140 kg  03/26/23 (!)  140.3 kg  02/26/23 (!) 138.3 kg    EKG today demonstrates  SR Vent. rate 76 BPM PR interval 194 ms QRS duration 68 ms QT/QTcB 400/450 ms  Echo 08/06/21 demonstrated  1. Left ventricular ejection fraction, by estimation, is >75%. The left  ventricle has hyperdynamic function. The left ventricle has no regional  wall motion abnormalities. There is mild left ventricular hypertrophy.  Left ventricular diastolic parameters were normal.   2. Right ventricular systolic function is normal. The right ventricular  size is normal.   3. Mild mitral valve regurgitation.   4. The aortic valve is tricuspid. Aortic valve regurgitation is not  visualized. Mild aortic valve sclerosis is present, with no evidence of  aortic valve stenosis.   5. The inferior vena cava is normal in size with greater than 50%  respiratory variability, suggesting right atrial pressure of 3 mmHg.   Comparison(s): The left ventricular function is unchanged.   Epic records are reviewed at length today  CHA2DS2-VASc Score = 2  The patient's score is based upon: CHF History: 0 HTN History: 1 Diabetes History: 0 Stroke History: 0 Vascular Disease History: 0 Age Score: 0 Gender Score: 1      ASSESSMENT AND PLAN: Paroxysmal Atrial Fibrillation (ICD10:  I48.0) The patient's CHA2DS2-VASc score is 2, indicating a 2.2% annual risk of stroke.   Patient appears to be maintaining SR.  Continue flecainide 50 mg BID Discontinue diltiazem and start Toprol 12.5 mg daily, see plans below.  Continue Eliquis 5 mg BID Check bmet/cbc  Obesity Body mass index is 54.67 kg/m.  Encouraged lifestyle modification  OSA  Encouraged nightly CPAP Has appointment with Dr Tresa Endo on 11/12  HTN Stable today, but has had symptomatic low readings at home and at work. There is a mention of possible POTS in her history. She wears compression stockings intermittently. Will stop diltiazem and start Toprol 12.5 mg to see if that helps her low  BP readings.    Follow up in the AF clinic in 6 months.    [name] PA-C [facility] [address] [place], [place] [address] [phone] 09/04/2023 3:44 PM

## 2023-09-12 NOTE — Progress Notes (Signed)
Office Visit Note  Patient: Christine [name]             Date of Birth: [birth year]           MRN: [id number]             PCP: Lance Bosch, NP Referring: Lance Bosch, NP Visit Date: 09/25/2023 Occupation: @GUAROCC @  Subjective:  Fatigue and pain   History of Present Illness: Christine [name] is a 57 y.o. female with fibromyalgia and osteoarthritis.  She states she continues to have generalized pain and discomfort from fibromyalgia.  She reports discomfort in the left trapezius region.  She states the right epicondyle pain has eased off.  She continues to have increased fatigue.  She states she gets up frequently to avoid trochanteric bursitis.  She has noticed when she walks sometimes her right knee joint gives out on her.  She has been also having intermittent discomfort in her lower back.  There is no history of joint swelling.  She continues to take methocarbamol 500 mg p.o. twice daily as needed and tizanidine at bedtime for muscle spasms.  She takes Ambien only on as needed basis.  She has been walking regularly for exercise.    Activities of Daily Living:  Patient reports morning stiffness for 10-15 minutes.   Patient Reports nocturnal pain.  Difficulty dressing/grooming: Denies Difficulty climbing stairs: Denies Difficulty getting out of chair: Denies Difficulty using hands for taps, buttons, cutlery, and/or writing: Denies  Review of Systems  Constitutional:  Positive for fatigue.  HENT:  Negative for mouth sores and mouth dryness.   Eyes:  Negative for dryness.  Respiratory:  Negative for shortness of breath.   Cardiovascular:  Negative for chest pain and palpitations.  Gastrointestinal:  Negative for blood in stool, constipation and diarrhea.  Endocrine: Negative for increased urination.  Genitourinary:  Negative for involuntary urination.  Musculoskeletal:  Positive for joint pain, gait problem, joint pain, myalgias, morning stiffness, muscle tenderness and  myalgias. Negative for joint swelling.  Skin:  Positive for hair loss and sensitivity to sunlight. Negative for color change and rash.  Allergic/Immunologic: Negative for susceptible to infections.  Neurological:  Positive for headaches. Negative for dizziness.  Hematological:  Negative for swollen glands.  Psychiatric/Behavioral:  Positive for sleep disturbance. Negative for depressed mood. The patient is nervous/anxious.     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   Trochanteric bursitis of both hips 10/18/2016   Primary osteoarthritis of both knees 10/18/2016   Chondromalacia, patella, unspecified laterality 10/18/2016   Bilateral calcaneal spurs 10/18/2016   Vitamin D deficiency 10/18/2016   UTI (urinary tract infection) 11/08/2014   Allergic rhinitis 02/21/2013   Migraine 02/21/2013    Past Medical History:  Diagnosis Date   A-fib (HCC)    Allergy    Diverticulitis    Fibromyalgia    POTS (postural orthostatic tachycardia syndrome)    per patient    Family History  Problem Relation Age of Onset   Asthma Mother    Heart Problems Father    Heart disease Brother    Scoliosis Son    Migraines Neg Hx    Headache Neg Hx    Past Surgical History:  Procedure Laterality Date  ABDOMINAL HYSTERECTOMY     CESAREAN SECTION     COLONOSCOPY  01/09/2023   FINGER SURGERY     left ring finger x 2   FRACTURE SURGERY     TUBAL LIGATION     UPPER GI ENDOSCOPY  01/09/2023   Social History   Social History Narrative   Not on file   Immunization History  Administered Date(s) Administered   PFIZER(Purple Top)SARS-COV-2 Vaccination 03/12/2020, 04/02/2020   Tdap 06/28/2018     Objective: Vital Signs: BP (!)  163/91 (BP Location: Right Arm, Patient Position: Sitting, Cuff Size: Normal)   Pulse 73   Resp 14   Ht 5\' 3"  (1.6 m)   Wt (!) 313 lb (142 kg)   LMP 04/02/2006   BMI 55.45 kg/m    Physical Exam Vitals and nursing note reviewed.  Constitutional:      Appearance: She is well-developed.  HENT:     Head: Normocephalic and atraumatic.  Eyes:     Conjunctiva/sclera: Conjunctivae normal.  Cardiovascular:     Rate and Rhythm: Normal rate and regular rhythm.     Heart sounds: Normal heart sounds.  Pulmonary:     Effort: Pulmonary effort is normal.     Breath sounds: Normal breath sounds.  Abdominal:     General: Bowel sounds are normal.     Palpations: Abdomen is soft.  Musculoskeletal:     Cervical back: Normal range of motion.  Lymphadenopathy:     Cervical: No cervical adenopathy.  Skin:    General: Skin is warm and dry.     Capillary Refill: Capillary refill takes less than 2 seconds.  Neurological:     Mental Status: She is alert and oriented to person, place, and time.  Psychiatric:        Behavior: Behavior normal.      Musculoskeletal Exam: Cervical spine was in good range of motion.  She had some trapezius spasm.  Thoracic and lumbar spine range of motion was difficult to assess due to body habitus.  Shoulder joints, elbow joints, wrist joints, MCPs PIPs and DIPs were in good range of motion with no synovitis.  Hip joints and knee joints were in good range of motion without any warmth swelling or effusion.  There was no tenderness over ankles or MTPs.  She had generalized hyperalgesia.  CDAI Exam: CDAI Score: -- Patient Global: --; Provider Global: -- Swollen: --; Tender: -- Joint Exam 09/25/2023   No joint exam has been documented for this visit   There is currently no information documented on the homunculus. Go to the Rheumatology activity and complete the homunculus joint exam.  Investigation: No additional findings.  Imaging: No results found.  Recent  Labs: Lab Results  Component Value Date   WBC 7.0 05/19/2021   HGB 15.8 (H) 05/19/2021   PLT 368 05/19/2021   NA 140 05/19/2021   K 4.3 05/19/2021   CL 103 05/19/2021   CO2 25 05/19/2021   GLUCOSE 98 05/19/2021   BUN 7 05/19/2021   CREATININE 0.82 05/19/2021   BILITOT 1.1 05/19/2021   ALKPHOS 59 05/19/2021   AST 28 05/19/2021   ALT 29 05/19/2021   PROT 7.3 05/19/2021   ALBUMIN 4.0 05/19/2021   CALCIUM 10.0 05/19/2021   GFRAA >60 02/15/2020    Speciality Comments: No specialty comments available.  Procedures:  No procedures performed Allergies: Other, Penicillins, and Sulfa antibiotics   Assessment / Plan:     Visit Diagnoses: Fibromyalgia -she continues to have  generalized pain and hyperalgesia.  She had positive tender points.  She is on methocarbamol 500 mg twice daily and tizanidine 4 mg at bedtime as needed for muscle spasms.  Patient states she takes muscle relaxers on as needed basis.  She has been having generalized pain and discomfort.  She also has intermittent lower back pain.  She denies any radiculopathy.  I gave her a handout on back exercises.  Lateral epicondylitis, right elbow-improved.  She works on the computer for long hours which flares the right epicondyle region discomfort.  Other fatigue-she has been experiencing increased fatigue.  Need for regular exercise was emphasized.  Patient states she tried to walk on a regular basis.  Primary insomnia -she takes ambien 5 mg 1 tablet at bedtime as needed for insomnia.  Trochanteric bursitis of both hips-she has recurrent trochanteric bursitis which is currently asymptomatic.  Patient states she has to get up frequently to stretch at work.  Primary osteoarthritis of both knees-I reviewed her previous x-rays which showed some osteoarthritic changes.  She has not had x-rays in several years.  Patient denies any discomfort but she states she has giving out since knee joint at times.  She had good range of motion  without any warmth swelling or effusion.  Patient states she has tried physical therapy in the past.  I gave her a handout on lower extremity muscle strengthening exercises.  Bilateral calcaneal spurs-not traumatic.  DDD (degenerative disc disease), lumbar-she gives history of intermittent lower back pain.  She denies any radiculopathy.  I handout on back exercises was given.  POTS (postural orthostatic tachycardia syndrome)  Vitamin D deficiency  History of migraine  History of IBS-patient states that she has history of diverticulitis.  She also complains of decreased appetite.  Orders: No orders of the defined types were placed in this encounter.  Meds ordered this encounter  Medications   tiZANidine (ZANAFLEX) 4 MG tablet    Sig: TAKE ONE TABLET BY MOUTH AT BEDTIME AS NEEDED FOR MUSCLE SPASM    Dispense:  90 tablet    Refill:  0   methocarbamol (ROBAXIN) 500 MG tablet    Sig: Take 1 tablet (500 mg total) by mouth 2 (two) times daily as needed for muscle spasms.    Dispense:  60 tablet    Refill:  2    Follow-Up Instructions: Return in about 6 months (around 03/25/2024) for FMS, OA.   Pollyann Savoy, MD  Note - This record has been created using Animal nutritionist.  Chart creation errors have been sought, but may not always  have been located. Such creation errors do not reflect on  the standard of medical care.

## 2023-09-25 ENCOUNTER — Ambulatory Visit: Payer: 59 | Attending: Rheumatology | Admitting: Rheumatology

## 2023-09-25 ENCOUNTER — Encounter: Payer: Self-pay | Admitting: Rheumatology

## 2023-09-25 VITALS — BP 163/91 | HR 73 | Resp 14 | Ht 63.0 in | Wt 313.0 lb

## 2023-09-25 DIAGNOSIS — Z8719 Personal history of other diseases of the digestive system: Secondary | ICD-10-CM

## 2023-09-25 DIAGNOSIS — M5136 Other intervertebral disc degeneration, lumbar region: Secondary | ICD-10-CM

## 2023-09-25 DIAGNOSIS — M170 Bilateral primary osteoarthritis of knee: Secondary | ICD-10-CM

## 2023-09-25 DIAGNOSIS — M7731 Calcaneal spur, right foot: Secondary | ICD-10-CM

## 2023-09-25 DIAGNOSIS — R5383 Other fatigue: Secondary | ICD-10-CM

## 2023-09-25 DIAGNOSIS — M7711 Lateral epicondylitis, right elbow: Secondary | ICD-10-CM

## 2023-09-25 DIAGNOSIS — M797 Fibromyalgia: Secondary | ICD-10-CM | POA: Diagnosis not present

## 2023-09-25 DIAGNOSIS — M7062 Trochanteric bursitis, left hip: Secondary | ICD-10-CM

## 2023-09-25 DIAGNOSIS — F5101 Primary insomnia: Secondary | ICD-10-CM

## 2023-09-25 DIAGNOSIS — M7732 Calcaneal spur, left foot: Secondary | ICD-10-CM

## 2023-09-25 DIAGNOSIS — Z8669 Personal history of other diseases of the nervous system and sense organs: Secondary | ICD-10-CM

## 2023-09-25 DIAGNOSIS — M51369 Other intervertebral disc degeneration, lumbar region without mention of lumbar back pain or lower extremity pain: Secondary | ICD-10-CM

## 2023-09-25 DIAGNOSIS — E559 Vitamin D deficiency, unspecified: Secondary | ICD-10-CM

## 2023-09-25 DIAGNOSIS — M7061 Trochanteric bursitis, right hip: Secondary | ICD-10-CM

## 2023-09-25 DIAGNOSIS — G90A Postural orthostatic tachycardia syndrome (POTS): Secondary | ICD-10-CM

## 2023-09-25 MED ORDER — TIZANIDINE HCL 4 MG PO TABS: ORAL_TABLET | ORAL | 0 refills | Status: DC

## 2023-09-25 MED ORDER — METHOCARBAMOL 500 MG PO TABS: 500.0000 mg | ORAL_TABLET | Freq: Two times a day (BID) | ORAL | 2 refills | Status: DC | PRN

## 2023-09-25 NOTE — Patient Instructions (Signed)
Low Back Sprain or Strain Rehab Ask your health care provider which exercises are safe for you. Do exercises exactly as told by your health care provider and adjust them as directed. It is normal to feel mild stretching, pulling, tightness, or discomfort as you do these exercises. Stop right away if you feel sudden pain or your pain gets worse. Do not begin these exercises until told by your health care provider. Stretching and range-of-motion exercises These exercises warm up your muscles and joints and improve the movement and flexibility of your back. These exercises also help to relieve pain, numbness, and tingling. Lumbar rotation  Lie on your back on a firm bed or the floor with your knees bent. Straighten your arms out to your sides so each arm forms a 90-degree angle (right angle) with a side of your body. Slowly move (rotate) both of your knees to one side of your body until you feel a stretch in your lower back (lumbar). Try not to let your shoulders lift off the floor. Hold this position for __________ seconds. Tense your abdominal muscles and slowly move your knees back to the starting position. Repeat this exercise on the other side of your body. Repeat __________ times. Complete this exercise __________ times a day. Single knee to chest  Lie on your back on a firm bed or the floor with both legs straight. Bend one of your knees. Use your hands to move your knee up toward your chest until you feel a gentle stretch in your lower back and buttock. Hold your leg in this position by holding on to the front of your knee. Keep your other leg as straight as possible. Hold this position for __________ seconds. Slowly return to the starting position. Repeat with your other leg. Repeat __________ times. Complete this exercise __________ times a day. Prone extension on elbows  Lie on your abdomen on a firm bed or the floor (prone position). Prop yourself up on your elbows. Use your arms  to help lift your chest up until you feel a gentle stretch in your abdomen and your lower back. This will place some of your body weight on your elbows. If this is uncomfortable, try stacking pillows under your chest. Your hips should stay down, against the surface that you are lying on. Keep your hip and back muscles relaxed. Hold this position for __________ seconds. Slowly relax your upper body and return to the starting position. Repeat __________ times. Complete this exercise __________ times a day. Strengthening exercises These exercises build strength and endurance in your back. Endurance is the ability to use your muscles for a long time, even after they get tired. Pelvic tilt This exercise strengthens the muscles that lie deep in the abdomen. Lie on your back on a firm bed or the floor with your legs extended. Bend your knees so they are pointing toward the ceiling and your feet are flat on the floor. Tighten your lower abdominal muscles to press your lower back against the floor. This motion will tilt your pelvis so your tailbone points up toward the ceiling instead of pointing to your feet or the floor. To help with this exercise, you may place a small towel under your lower back and try to push your back into the towel. Hold this position for __________ seconds. Let your muscles relax completely before you repeat this exercise. Repeat __________ times. Complete this exercise __________ times a day. Alternating arm and leg raises  Get on your hands  and knees on a firm surface. If you are on a hard floor, you may want to use padding, such as an exercise mat, to cushion your knees. Line up your arms and legs. Your hands should be directly below your shoulders, and your knees should be directly below your hips. Lift your left leg behind you. At the same time, raise your right arm and straighten it in front of you. Do not lift your leg higher than your hip. Do not lift your arm higher  than your shoulder. Keep your abdominal and back muscles tight. Keep your hips facing the ground. Do not arch your back. Keep your balance carefully, and do not hold your breath. Hold this position for __________ seconds. Slowly return to the starting position. Repeat with your right leg and your left arm. Repeat __________ times. Complete this exercise __________ times a day. Abdominal set with straight leg raise  Lie on your back on a firm bed or the floor. Bend one of your knees and keep your other leg straight. Tense your abdominal muscles and lift your straight leg up, 4-6 inches (10-15 cm) off the ground. Keep your abdominal muscles tight and hold this position for __________ seconds. Do not hold your breath. Do not arch your back. Keep it flat against the ground. Keep your abdominal muscles tense as you slowly lower your leg back to the starting position. Repeat with your other leg. Repeat __________ times. Complete this exercise __________ times a day. Single leg lower with bent knees Lie on your back on a firm bed or the floor. Tense your abdominal muscles and lift your feet off the floor, one foot at a time, so your knees and hips are bent in 90-degree angles (right angles). Your knees should be over your hips and your lower legs should be parallel to the floor. Keeping your abdominal muscles tense and your knee bent, slowly lower one of your legs so your toe touches the ground. Lift your leg back up to return to the starting position. Do not hold your breath. Do not let your back arch. Keep your back flat against the ground. Repeat with your other leg. Repeat __________ times. Complete this exercise __________ times a day. Posture and body mechanics Good posture and healthy body mechanics can help to relieve stress in your body's tissues and joints. Body mechanics refers to the movements and positions of your body while you do your daily activities. Posture is part of body  mechanics. Good posture means: Your spine is in its natural S-curve position (neutral). Your shoulders are pulled back slightly. Your head is not tipped forward (neutral). Follow these guidelines to improve your posture and body mechanics in your everyday activities. Standing  When standing, keep your spine neutral and your feet about hip-width apart. Keep a slight bend in your knees. Your ears, shoulders, and hips should line up. When you do a task in which you stand in one place for a long time, place one foot up on a stable object that is 2-4 inches (5-10 cm) high, such as a footstool. This helps keep your spine neutral. Sitting  When sitting, keep your spine neutral and keep your feet flat on the floor. Use a footrest, if necessary, and keep your thighs parallel to the floor. Avoid rounding your shoulders, and avoid tilting your head forward. When working at a desk or a computer, keep your desk at a height where your hands are slightly lower than your elbows. Slide your  chair under your desk so you are close enough to maintain good posture. When working at a computer, place your monitor at a height where you are looking straight ahead and you do not have to tilt your head forward or downward to look at the screen. Resting When lying down and resting, avoid positions that are most painful for you. If you have pain with activities such as sitting, bending, stooping, or squatting, lie in a position in which your body does not bend very much. For example, avoid curling up on your side with your arms and knees near your chest (fetal position). If you have pain with activities such as standing for a long time or reaching with your arms, lie with your spine in a neutral position and bend your knees slightly. Try the following positions: Lying on your side with a pillow between your knees. Lying on your back with a pillow under your knees. Lifting  When lifting objects, keep your feet at least  shoulder-width apart and tighten your abdominal muscles. Bend your knees and hips and keep your spine neutral. It is important to lift using the strength of your legs, not your back. Do not lock your knees straight out. Always ask for help to lift heavy or awkward objects. This information is not intended to replace advice given to you by your health care provider. Make sure you discuss any questions you have with your health care provider. Document Revised: 04/07/2023 Document Reviewed: 02/19/2021 Elsevier Patient Education  2024 [employer]  Exercises for Chronic Knee Pain Chronic knee pain is pain that lasts longer than 3 months. For most people with chronic knee pain, exercise and weight loss is an important part of treatment. Your health care provider may want you to focus on: Making the muscles that support your knee stronger. This can take pressure off your knee and reduce pain. Preventing knee stiffness. How far you can move your knee, keeping it there or making it farther. Losing weight (if this applies) to take pressure off your knee, lower your risk for injury, and make it easier for you to exercise. Your provider will help you make an exercise program that fits your needs and physical abilities. Below are simple, low-impact exercises you can do at home. Ask your provider or physical therapist how often you should do your exercise program and how many times to repeat each exercise. General safety tips  Get your provider's approval before doing any exercises. Start slowly and stop any time you feel pain. Do not exercise if your knee pain is flaring up. Warm up first. Stretching a cold muscle can cause an injury. Do 5-10 minutes of easy movement or light stretching before beginning your exercises. Do 5-10 minutes of low-impact activity (like walking or cycling) before starting strengthening exercises. Contact your provider any time you have pain during or after exercising. Exercise  can cause discomfort but should not be painful. It is normal to be a little stiff or sore after exercising. Stretching and range-of-motion exercises Front thigh stretch  Stand up straight and support your body by holding on to a chair or resting one hand on a wall. With your legs straight and close together, bend one knee to lift your heel up toward your butt. Using one hand for support, grab your ankle with your free hand. Pull your foot up closer toward your butt to feel the stretch in front of your thigh. Hold the stretch for 30 seconds. Repeat __________ times. Complete  this exercise __________ times a day. Back thigh stretch  Sit on the floor with your back straight and your legs out straight in front of you. Place the palms of your hands on the floor and slide them toward your feet as you bend at the hip. Try to touch your nose to your knees and feel the stretch in the back of your thighs. Hold for 30 seconds. Repeat __________ times. Complete this exercise __________ times a day. Calf stretch  Stand facing a wall. Place the palms of your hands flat against the wall, arms extended, and lean slightly against the wall. Get into a lunge position with one leg bent at the knee and the other leg stretched out straight behind you. Keep both feet facing the wall and increase the bend in your knee while keeping the heel of the other leg flat on the ground. You should feel the stretch in your calf. Hold for 30 seconds. Repeat __________ times. Complete this exercise __________ times a day. Strengthening exercises Straight leg lift  Lie on your back with one knee bent and the other leg out straight. Slowly lift the straight leg without bending the knee. Lift until your foot is about 12 inches (30 cm) off the floor. Hold for 3-5 seconds and slowly lower your leg. Repeat __________ times. Complete this exercise __________ times a day. Single leg dip  Stand between two chairs and put both  hands on the backs of the chairs for support. Extend one leg out straight with your body weight resting on the heel of the standing leg. Slowly bend your standing knee to dip your body to the level that is comfortable for you. Hold for 3-5 seconds. Repeat __________ times. Complete this exercise __________ times a day. Hamstring curls  Stand straight, knees close together, facing the back of a chair. Hold on to the back of a chair with both hands. Keep one leg straight. Bend the other knee while bringing the heel up toward the butt until the knee is bent at a 90-degree angle (right angle). Hold for 3-5 seconds. Repeat __________ times. Complete this exercise __________ times a day. Wall squat  Stand straight with your back, hips, and head against a wall. Step forward one foot at a time with your back still against the wall. Your feet should be 2 feet (61 cm) from the wall at shoulder width. Keeping your back, hips, and head against the wall, slide down the wall to as close to a sitting position as you can get. Hold for 5-10 seconds, then slowly slide back up. Repeat __________ times. Complete this exercise __________ times a day. Step-ups  Stand in front of a sturdy platform or stool that is about 6 inches (15 cm) high. Slowly step up with your left / right foot, keeping your knee in line with your hip and foot. Do not let your knee bend so far that you cannot see your toes. Hold on to a chair for balance, but do not use it for support. Slowly unlock your knee and lower yourself to the starting position. Repeat __________ times. Complete this exercise __________ times a day. Contact a health care provider if: Your exercises cause pain. Your pain is worse after you exercise. Your pain prevents you from doing your exercises. This information is not intended to replace advice given to you by your health care provider. Make sure you discuss any questions you have with your health care  provider. Document Revised: 12/17/2022 Document  Reviewed: 12/17/2022 Elsevier Patient Education  2024 [employer].

## 2023-09-30 ENCOUNTER — Encounter: Payer: Self-pay | Admitting: Pulmonary Disease

## 2023-09-30 ENCOUNTER — Ambulatory Visit (INDEPENDENT_AMBULATORY_CARE_PROVIDER_SITE_OTHER): Payer: 59 | Admitting: Pulmonary Disease

## 2023-09-30 ENCOUNTER — Ambulatory Visit: Payer: 59 | Admitting: Pulmonary Disease

## 2023-09-30 VITALS — BP 136/84 | HR 90 | Temp 98.2°F | Ht 63.0 in | Wt 312.0 lb

## 2023-09-30 DIAGNOSIS — R059 Cough, unspecified: Secondary | ICD-10-CM

## 2023-09-30 DIAGNOSIS — J309 Allergic rhinitis, unspecified: Secondary | ICD-10-CM | POA: Diagnosis not present

## 2023-09-30 NOTE — Progress Notes (Signed)
Full PFT performed today. °

## 2023-09-30 NOTE — Progress Notes (Signed)
follo

## 2023-09-30 NOTE — Progress Notes (Signed)
Christine [name]    [id number]    27-Feb-1966  Primary Care Physician:Blevins, Sue Lush, NP  Referring Physician: Lance Bosch, NP [address] [place],  [place] [address]  Chief complaint: Consult for bronchitis, cough  HPI: 57 y.o. who  has a past medical history of A-fib (HCC), Allergy, Diverticulitis, Fibromyalgia, and POTS (postural orthostatic tachycardia syndrome).   She has been referred here for evaluation of cough, bronchitis.  She had an episode of pneumonia bronchitis in October 2023 treated with course of doxycycline, oral steroids, Tessalon and inhalers by primary care.  Follow-up chest x-ray on 10/28/2022 showed coarse interstitial markings and no any active cardiopulmonary disease  She has chronic cough, postnasal drip, allergies.  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  No feather pillows or comforters Smoking history: Minimal smoking as a teenager in the 1980s Travel history: Originally from [facility] [place].  No significant recent travel Relevant family history: No family history of lung disease  Interim History:  Discussed the use of AI scribe software for clinical note transcription with the patient, who gave verbal consent to proceed.  The patient, with a history of recurrent respiratory infections, presents for follow-up after a recent episode of bronchitis. She reports a lifelong history of year-round allergies and consistent post-nasal drainage. She was previously given samples of Trelegy and albuterol inhalers for the bronchitis, but only used the albuterol, stating she 'tends to take a little bit longer to heal up from sicknesses.' She reports feeling back to normal now. She has a family history of asthma in her mother, who had childhood asthma that resolved but recurred later in life. The patient has never had asthma, but reports  that she frequently develops bronchitis when she gets sick, such as with the flu or RSV.   Outpatient Encounter Medications as of 09/30/2023  Medication Sig   ELIQUIS 5 MG TABS tablet TAKE ONE TABLET BY MOUTH TWICE A DAY   flecainide (TAMBOCOR) 50 MG tablet TAKE ONE TABLET BY MOUTH TWICE A DAY   hyoscyamine (LEVSIN SL) 0.125 MG SL tablet Place 0.125 mg under the tongue as needed for cramping.   Levomefolic Acid (5-MTHF) 1 MG CAPS Take by mouth daily.   MAGNESIUM PO Take 1 tablet by mouth at bedtime.   methocarbamol (ROBAXIN) 500 MG tablet Take 1 tablet (500 mg total) by mouth 2 (two) times daily as needed for muscle spasms.   metoprolol succinate (TOPROL XL) 25 MG 24 hr tablet Take 0.5 tablets (12.5 mg total) by mouth daily.   Multiple Vitamin (MULTIVITAMIN PO) Take by mouth daily.   pantoprazole (PROTONIX) 40 MG tablet Take 40 mg by mouth every other day.   tiZANidine (ZANAFLEX) 4 MG tablet TAKE ONE TABLET BY MOUTH AT BEDTIME AS NEEDED FOR MUSCLE SPASM   Vitamin D-Vitamin K (VITAMIN K2-VITAMIN D3 PO) Take by mouth daily.   zolpidem (AMBIEN) 5 MG tablet TAKE ONE TABLET BY MOUTH AT BEDTIME AS NEEDED FOR SLEEP   No facility-administered encounter medications on file as of 09/30/2023.   Physical Exam: Blood pressure (!) 140/72, pulse 100, temperature 98.3 F (36.8 C), temperature source Oral, height 5\' 3"  (1.6 m), weight (!) 301 lb (136.5 kg), last menstrual period 04/02/2006, SpO2 98 %. Gen:      No acute distress HEENT:  EOMI, sclera anicteric Neck:  No masses; no thyromegaly Lungs:    Clear to auscultation bilaterally; normal respiratory effort CV:         Regular rate and rhythm; no murmurs Abd:      + bowel sounds; soft, non-tender; no palpable masses, no distension Ext:    No edema; adequate peripheral perfusion Skin:      Warm and dry; no rash Neuro: alert and oriented x 3 Psych: normal mood and affect  Data Reviewed: Imaging: Chest x-ray 10/28/2022-coarsened interstitial  markings, no airspace disease I had reviewed the images personally  PFTs: 09/30/2023 FVC 2.59 [77%], FEV1 2.18 [83%], F/F84, TLC 4.63 [94%], DLCO 24.54 [122%] Normal test  Labs:  Assessment:  Chronic Allergic Rhinitis Persistent postnasal drainage. No abnormalities on ENT evaluation. -Continue current management.  Acute Bronchitis Resolved. No need for daily inhaler based on normal lung function tests and absence of symptoms. -Continue Albuterol as needed. -Consider Xopenex as an alternative if Albuterol increases heart rate.  Pneumonia (October 2023) Resolved. No recurrence.  Follow-up No need for regular follow-up with pulmonology. Return as needed. Primary care to manage future respiratory illnesses.   Plan/Recommendations: Follow-up as needed  Chilton Greathouse MD [facility] 09/30/2023, 4:26 PM  CC: Lance Bosch, NP

## 2023-09-30 NOTE — Patient Instructions (Signed)
VISIT SUMMARY:  During your recent visit, we discussed your history of recurrent respiratory infections and allergies. You reported that you are feeling back to normal after your recent episode of bronchitis. We also discussed your family history of asthma and your tendency to develop bronchitis when you get sick.  YOUR PLAN:  -CHRONIC ALLERGIC RHINITIS: This is a long-term inflammation of the nose due to allergies. You should continue your current management for this condition.  -ACUTE BRONCHITIS: This is an inflammation of the bronchial tubes in your lungs, often due to an infection. Your bronchitis has resolved, and you should continue to use Albuterol as needed. If Albuterol increases your heart rate, consider using Xopenex as an alternative.  -PNEUMONIA: This is an infection that inflames the air sacs in one or both lungs. Your pneumonia from October 2023 has resolved and there has been no recurrence.  INSTRUCTIONS:  There is no need for regular follow-up with pulmonology. You should return as needed and your primary care doctor will manage any future respiratory illnesses.

## 2023-09-30 NOTE — Patient Instructions (Signed)
Full PFT performed today. °

## 2023-10-02 ENCOUNTER — Other Ambulatory Visit: Payer: Self-pay | Admitting: Rheumatology

## 2023-10-06 LAB — PULMONARY FUNCTION TEST
DL/VA % pred: 120 %
DL/VA: 5.19 ml/min/mmHg/L
DLCO cor % pred: 114 %
DLCO cor: 23 ml/min/mmHg
DLCO unc % pred: 122 %
DLCO unc: 24.54 ml/min/mmHg
FEF 25-75 Post: 2.72 L/s
FEF 25-75 Pre: 2.04 L/s
FEF2575-%Change-Post: 33 %
FEF2575-%Pred-Post: 106 %
FEF2575-%Pred-Pre: 80 %
FEV1-%Change-Post: 8 %
FEV1-%Pred-Post: 83 %
FEV1-%Pred-Pre: 76 %
FEV1-Post: 2.18 L
FEV1-Pre: 2.01 L
FEV1FVC-%Change-Post: 3 %
FEV1FVC-%Pred-Pre: 102 %
FEV6-%Change-Post: 5 %
FEV6-%Pred-Post: 80 %
FEV6-%Pred-Pre: 76 %
FEV6-Post: 2.59 L
FEV6-Pre: 2.47 L
FEV6FVC-%Pred-Post: 103 %
FEV6FVC-%Pred-Pre: 103 %
FVC-%Change-Post: 5 %
FVC-%Pred-Post: 77 %
FVC-%Pred-Pre: 74 %
FVC-Post: 2.59 L
FVC-Pre: 2.47 L
Post FEV1/FVC ratio: 84 %
Post FEV6/FVC ratio: 100 %
Pre FEV1/FVC ratio: 81 %
Pre FEV6/FVC Ratio: 100 %
RV % pred: 108 %
RV: 1.97 L
TLC % pred: 94 %
TLC: 4.63 L

## 2023-10-28 ENCOUNTER — Ambulatory Visit: Payer: 59 | Admitting: Cardiovascular Disease

## 2023-11-18 NOTE — Patient Instructions (Signed)

## 2023-11-18 NOTE — Progress Notes (Unsigned)
No chief complaint on file.   HISTORY OF PRESENT ILLNESS:  11/18/23 ALL: Christine [name] returns for follow up for IIH. She was last seen 11/2022 and wished to continue working on healthy weight management. Since,   12/04/2022 ALL: Christine [name] returns for follow up for IIH. She was last seen 07/2022 and hesitant to start Diamox for IIH. She opted to work on [employer]. Since, she reports doing fairly well from an IIH/headache standpoint. She denies any concerns of headaches. She has been sick with respiratory illness for the past three months. She just finished prednisone. She has gained about 15lbs. She reports BP is usually well managed but she is having flare of chronic back pain, today. She is followed closely by cardiology and recently started on flecainide for afib. She also takes Cardizem. She was diagnosed with OSA 09/2021 but is having trouble getting CPAP machine. She reports cardiology is helping her with this. Using [employer] for DME. She is trying to eat more protein and less carbs.   Eye exam reportedly normal in 08/2022. She has follow up 08/2023. No vision changes. No vision loss.   07/30/2022 ALL: Christine [name] is a 57 y.o. female here today for follow up for IIH. She had an MRI 06/2022 showing concerns for papilledema. She was sent for LP showing opening pressure of 29. Diamox advised but she is hesitant. She has previous taken topiramate and did not like side effects. She reports tingling in extremities and fogginess. She is very hesitant to taking medicaitons. She has a sulfa allergy. She had "severe hives" after taking sulfa based abx for strep throat about 30 years ago. She reports headaches are significantly improved. She does have a headache, today, but correlates this to recent diverticulitis flare. She reports being dehydrated from vomiting. She is working on [employer]. She was able to lose 115 pounds when previously eating a Keto based diet. She has a hard time  tolerating foods due to diverticulitis. She denies vision changes. She has an appt with Dr Lucretia Roers this week.     HISTORY (copied from Dr Trevor Mace previous note)  HPI:  Christine [name] is a 57 y.o. female here as requested by Lance Bosch, NP for new onset headache.  Past medical history pseudotumor cerebri, fibromyalgia, A-fib, POTS, OSA, obesity, IBS, migraines.  I reviewed handwritten notes from Lance Bosch, last seen May 24, 2022, headaches for 2 weeks, cannot work, her eyes are twitching, blood pressure at home has been 108 over the 50s to 60s, takes magnesium gluconate daily, fibromyalgia worse with blood pressure medications, on Eliquis for A-fib, has been stressed at work.   She has had headaches in the past, this is not similar, she has migraines, these headaches are different. They started 3 weeks ago. They feel consistent headache, in the forehead from temple to temple. Her blood pressure has been lower. She was started on prednisone and had side effects so she had to stop. Nothing happened when the headache started, she was diagnosed with sleep apnea but has not treated it with either oral device or cpap. She was ordered a cpap. Consistent headache. Throughout the day. Laying down helps. Positional, worse standing, she have noticed some minimal vision changes, not exertional, vision has been blurrier than normal, she is due to get an eye exam in one month. No pulsating/pounding/throbbing or other significant migrainous featShe denies any stress, any focal neurologic symptoms. No jaw pain or other signs/symptoms of GCA and esr/crp normal. No  other focal neurologic deficits, associated symptoms, inciting events or modifiable factors.   Reviewed notes, labs and imaging from outside physicians, which showed:   Reviewed blood work May 25, 2022, CBC normal, CMP unremarkable with creatinine 1.05 and BUN 17, TSH normal, sed rate 37 normal, CRP 6 normal   MRI brain 04/02/2021: IMPRESSION:  No  intrinsic pathology of the orbits or globes.  Question slight  prominence of cerebrospinal fluid in the optic nerve sheaths.  This  can be seen with increased intracranial pressure/pseudotumor.  The  finding is not definitely pathologic.   REVIEW OF SYSTEMS: Out of a complete 14 system review of symptoms, the patient complains only of the following symptoms, headaches, abdominal pain, muscle and joint pain and all other reviewed systems are negative.   ALLERGIES: Allergies  Allergen Reactions   Other     "an IBS med"   Penicillins Hives   Sulfa Antibiotics Hives     HOME MEDICATIONS: Outpatient Medications Prior to Visit  Medication Sig Dispense Refill   ELIQUIS 5 MG TABS tablet TAKE ONE TABLET BY MOUTH TWICE A DAY 60 tablet 5   flecainide (TAMBOCOR) 50 MG tablet TAKE ONE TABLET BY MOUTH TWICE A DAY 180 tablet 1   hyoscyamine (LEVSIN SL) 0.125 MG SL tablet Place 0.125 mg under the tongue as needed for cramping.     Levomefolic Acid (5-MTHF) 1 MG CAPS Take by mouth daily.     MAGNESIUM PO Take 1 tablet by mouth at bedtime.     methocarbamol (ROBAXIN) 500 MG tablet Take 1 tablet (500 mg total) by mouth 2 (two) times daily as needed for muscle spasms. 60 tablet 2   metoprolol succinate (TOPROL XL) 25 MG 24 hr tablet Take 0.5 tablets (12.5 mg total) by mouth daily. 30 tablet 6   Multiple Vitamin (MULTIVITAMIN PO) Take by mouth daily.     pantoprazole (PROTONIX) 40 MG tablet Take 40 mg by mouth every other day.     tiZANidine (ZANAFLEX) 4 MG tablet TAKE ONE TABLET BY MOUTH AT BEDTIME AS NEEDED FOR MUSCLE SPASM 90 tablet 0   Vitamin D-Vitamin K (VITAMIN K2-VITAMIN D3 PO) Take by mouth daily.     zolpidem (AMBIEN) 5 MG tablet TAKE ONE TABLET BY MOUTH AT BEDTIME AS NEEDED FOR SLEEP 30 tablet 0   No facility-administered medications prior to visit.     PAST MEDICAL HISTORY: Past Medical History:  Diagnosis Date   A-fib (HCC)    Allergy    Diverticulitis    Fibromyalgia    POTS  (postural orthostatic tachycardia syndrome)    per patient     PAST SURGICAL HISTORY: Past Surgical History:  Procedure Laterality Date   ABDOMINAL HYSTERECTOMY     CESAREAN SECTION     COLONOSCOPY  01/09/2023   FINGER SURGERY     left ring finger x 2   FRACTURE SURGERY     TUBAL LIGATION     UPPER GI ENDOSCOPY  01/09/2023     FAMILY HISTORY: Family History  Problem Relation Age of Onset   Asthma Mother    Heart Problems Father    Heart disease Brother    Scoliosis Son    Migraines Neg Hx    Headache Neg Hx      SOCIAL HISTORY: Social History   Socioeconomic History   Marital status: Married    Spouse name: Not on file   Number of children: Not on file   Years of education: Not on file  Highest education level: Not on file  Occupational History   Occupation: Agricultural engineer: [employer]  Tobacco Use   Smoking status: Former    Current packs/day: 0.00    Average packs/day: 0.3 packs/day for 2.0 years (0.5 ttl pk-yrs)    Types: Cigarettes    Start date: 02/21/1986    Quit date: 02/22/1988    Years since quitting: 35.7    Passive exposure: Past   Smokeless tobacco: Never   Tobacco comments:    Former smoker 09/04/23  Vaping Use   Vaping status: Never Used  Substance and Sexual Activity   Alcohol use: Yes    Alcohol/week: 1.0 standard drink of alcohol    Types: 1 Glasses of wine per week    Comment: monthly   Drug use: No   Sexual activity: Yes    Birth control/protection: Surgical  Other Topics Concern   Not on file  Social History Narrative   Not on file   Social Determinants of Health   Financial Resource Strain: Not on file  Food Insecurity: Not on file  Transportation Needs: Not on file  Physical Activity: Not on file  Stress: Not on file  Social Connections: Unknown (08/30/2022)   Received from [facility]   Social Network    Social Network: Not on file  Intimate Partner Violence: Unknown (08/30/2022)    Received from [facility]   HITS    Physically Hurt: Not on file    Insult or Talk Down To: Not on file    Threaten Physical Harm: Not on file    Scream or Curse: Not on file     PHYSICAL EXAM  There were no vitals filed for this visit.   There is no height or weight on file to calculate BMI.  Generalized: Well developed, in no acute distress  Cardiology: normal rate and rhythm, no murmur auscultated  Respiratory: clear to auscultation bilaterally    Neurological examination  Mentation: Alert oriented to time, place, history taking. Follows all commands speech and language fluent Cranial nerve II-XII: Pupils were equal round reactive to light. Extraocular movements were full, visual field were full on confrontational test. Facial sensation and strength were normal. Head turning and shoulder shrug  were normal and symmetric. Motor: The motor testing reveals 5 over 5 strength of all 4 extremities. Good symmetric motor tone is noted throughout.  Gait and station: Gait is normal.    DIAGNOSTIC DATA (LABS, IMAGING, TESTING) - I reviewed patient records, labs, notes, testing and imaging myself where available.  Lab Results  Component Value Date   WBC 7.0 05/19/2021   HGB 15.8 (H) 05/19/2021   HCT 48.0 (H) 05/19/2021   MCV 89.7 05/19/2021   PLT 368 05/19/2021      Component Value Date/Time   NA 140 05/19/2021 1947   K 4.3 05/19/2021 1947   CL 103 05/19/2021 1947   CO2 25 05/19/2021 1947   GLUCOSE 98 05/19/2021 1947   BUN 7 05/19/2021 1947   CREATININE 0.82 05/19/2021 1947   CALCIUM 10.0 05/19/2021 1947   PROT 7.3 05/19/2021 1947   ALBUMIN 4.0 05/19/2021 1947   AST 28 05/19/2021 1947   ALT 29 05/19/2021 1947   ALKPHOS 59 05/19/2021 1947   BILITOT 1.1 05/19/2021 1947   GFRNONAA >60 05/19/2021 1947   GFRAA >60 02/15/2020 1631   No results found for: "CHOL", "HDL", "LDLCALC", "LDLDIRECT", "TRIG", "CHOLHDL" No results found for: "HGBA1C" No results  found for: "VITAMINB12" No results found for: "TSH"      No data to display               No data to display           ASSESSMENT AND PLAN  57 y.o. year old female  has a past medical history of A-fib (HCC), Allergy, Diverticulitis, Fibromyalgia, and POTS (postural orthostatic tachycardia syndrome). here with    No diagnosis found.  Darwin J Debose reports headaches are nearly resolved. No vision changes or concerns of vision loss. We have discussed MRI and LP results. She has an allergy to sulfa and prefers to avoid acetazolamide.She previously failed topiramate. We have discussed extended release topiramate, however, she is very hesitant. She would like to continue weight management plans. I have provided her additional information in her AVS. Weight management encouraged. Close follow up with ophthalmology advised. Healthy lifestyle habits encouraged. She will follow up with PCP as directed. She will return to see me in 1 year, sooner if needed. She verbalizes understanding and agreement with this plan.    No orders of the defined types were placed in this encounter.    No orders of the defined types were placed in this encounter.    Shawnie Dapper, MSN, FNP-C 11/18/2023, 12:58 PM  [facility] [address], [address] [place], [place] [address] [phone]

## 2023-11-19 ENCOUNTER — Encounter: Payer: Self-pay | Admitting: Family Medicine

## 2023-11-19 ENCOUNTER — Ambulatory Visit (INDEPENDENT_AMBULATORY_CARE_PROVIDER_SITE_OTHER): Payer: 59 | Admitting: Family Medicine

## 2023-11-19 VITALS — BP 133/85 | HR 90 | Ht 63.0 in | Wt 313.0 lb

## 2023-11-19 DIAGNOSIS — G932 Benign intracranial hypertension: Secondary | ICD-10-CM | POA: Diagnosis not present

## 2023-12-29 ENCOUNTER — Encounter: Payer: Self-pay | Admitting: Cardiovascular Disease

## 2023-12-29 ENCOUNTER — Ambulatory Visit: Payer: 59 | Attending: Cardiovascular Disease | Admitting: Cardiovascular Disease

## 2023-12-29 DIAGNOSIS — I480 Paroxysmal atrial fibrillation: Secondary | ICD-10-CM

## 2023-12-29 DIAGNOSIS — Z7901 Long term (current) use of anticoagulants: Secondary | ICD-10-CM | POA: Diagnosis not present

## 2023-12-29 DIAGNOSIS — G4733 Obstructive sleep apnea (adult) (pediatric): Secondary | ICD-10-CM

## 2023-12-29 DIAGNOSIS — M797 Fibromyalgia: Secondary | ICD-10-CM

## 2023-12-29 DIAGNOSIS — K588 Other irritable bowel syndrome: Secondary | ICD-10-CM

## 2023-12-29 NOTE — Progress Notes (Addendum)
 Cardiology Office Note    Date:  12/29/2023   ID:  TALEEYA [name], DOB 1966/05/28, MRN [id number]  PCP:  Barbra Odor, NP  Cardiologist:  Debby Sor, MD   New sleep consultation initially referred for sleep evaluation by Dr. Lonni Nanas  History of Present Illness:  Christine [name] is a 58 y.o. female who is followed by Dr. Lonni Nanas for cardiology care.  He has a history of fibromyalgia, irritable bowel syndrome, hypertension, paroxysmal atrial fibrillation.  In August 2022 an echo Doppler study showed hyperdynamic LV function with EF estimate greater than 75%.  Due to significant concerns for obstructive sleep apnea in light of snoring, morbid obesity,PAF, she was referred for a sleep study on September 27, 2021.  She was found to have mild overall sleep apnea with an AHI of 5.1/h but RDI was significantly elevated at 21.0/h.  AHI during REM sleep was 10.7.  O2 desaturated to a nadir of 86%.  She was noted to have mild snoring during the study.  Was recommended that she initiate a trial of AutoPap therapy initial pressure range set at 6 to 15 cm of water.  Apparently, at this time, there was significant supply chain issues with semiconductor's.  As result, it was often taking at times up to 8 months or longer to receive a new CPAP machine.  In the interim, subsequently her DME company, [employer] had gotten out of the CPAP business.  She never received the machine through them but ultimately she decided to purchase a machine probably 1 year later which he purchased online.  Apparently, at that time, [employer] had gotten out of the CPAP business and she was never transition to another company.  After buying the CPAP machine herself, she apparently only use it for less than a month and then stop using it due to leak and other issues.   She did not bring her machine with her today.  As result I do not have access to data know what her current  settings are..  Typically she goes to bed between 10:30 and 11 and wakes up around 5 and 530.  She admits that she is a very light sleeper.  She has a longstanding history of morbid obesity but apparently was on a keto diet and lost approximately 115 pounds.  Weight has returned and when her weight has increased to current weight at 313 (BMI 55.55).  She has a history of PAF and is also followed in the atrial fibrillation clinic last been evaluated on September 04, 2023 by Clint R. Fenton, [place].  She was maintaining sinus rhythm on flecainide  50 mg twice a day.  It was recommended that she discontinue diltiazem  and start Toprol  12.5 mg daily and continue Eliquis  5 mg twice a day.  Nightly CPAP use was encouraged.  Presently, she denies any significant daytime sleepiness.  She does not exercise but just walks occasionally.  She presents for her initial sleep evaluation and consultation.  Past Medical History:  Diagnosis Date   A-fib [facility])    Allergy    Diverticulitis    Fibromyalgia    POTS (postural orthostatic tachycardia syndrome)    per patient    Past Surgical History:  Procedure Laterality Date   ABDOMINAL HYSTERECTOMY     CESAREAN SECTION     COLONOSCOPY  01/09/2023   FINGER SURGERY     left ring finger x 2   FRACTURE SURGERY  TUBAL LIGATION     UPPER GI ENDOSCOPY  01/09/2023    Current Medications: Outpatient Medications Prior to Visit  Medication Sig Dispense Refill   ELIQUIS  5 MG TABS tablet TAKE ONE TABLET BY MOUTH TWICE A DAY 60 tablet 5   flecainide  (TAMBOCOR ) 50 MG tablet TAKE ONE TABLET BY MOUTH TWICE A DAY 180 tablet 1   hyoscyamine (LEVSIN SL) 0.125 MG SL tablet Place 0.125 mg under the tongue as needed for cramping.     Levomefolic Acid (5-MTHF) 1 MG CAPS Take by mouth daily.     MAGNESIUM PO Take 1 tablet by mouth at bedtime.     methocarbamol  (ROBAXIN ) 500 MG tablet Take 1 tablet (500 mg total) by mouth 2 (two) times daily as needed for muscle spasms. 60 tablet 2    metoprolol  succinate (TOPROL  XL) 25 MG 24 hr tablet Take 0.5 tablets (12.5 mg total) by mouth daily. 30 tablet 6   Multiple Vitamin (MULTIVITAMIN PO) Take by mouth daily.     pantoprazole (PROTONIX) 40 MG tablet Take 40 mg by mouth every other day.     tiZANidine  (ZANAFLEX ) 4 MG tablet TAKE ONE TABLET BY MOUTH AT BEDTIME AS NEEDED FOR MUSCLE SPASM 90 tablet 0   Vitamin D-Vitamin K (VITAMIN K2-VITAMIN D3 PO) Take by mouth daily.     zolpidem  (AMBIEN ) 5 MG tablet TAKE ONE TABLET BY MOUTH AT BEDTIME AS NEEDED FOR SLEEP 30 tablet 0   No facility-administered medications prior to visit.     Allergies:   Other, Penicillins, and Sulfa antibiotics   Social History   Socioeconomic History   Marital status: Married    Spouse name: Not on file   Number of children: Not on file   Years of education: Not on file   Highest education level: Not on file  Occupational History   Occupation: Agricultural Engineer: [employer]  Tobacco Use   Smoking status: Former    Current packs/day: 0.00    Average packs/day: 0.3 packs/day for 2.0 years (0.5 ttl pk-yrs)    Types: Cigarettes    Start date: 02/21/1986    Quit date: 02/22/1988    Years since quitting: 35.8    Passive exposure: Past   Smokeless tobacco: Never   Tobacco comments:    Former smoker 09/04/23  Vaping Use   Vaping status: Never Used  Substance and Sexual Activity   Alcohol use: Yes    Alcohol/week: 1.0 standard drink of alcohol    Types: 1 Glasses of wine per week    Comment: monthly   Drug use: No   Sexual activity: Yes    Birth control/protection: Surgical  Other Topics Concern   Not on file  Social History Narrative   Not on file   Social Drivers of Health   Financial Resource Strain: Not on file  Food Insecurity: Not on file  Transportation Needs: Not on file  Physical Activity: Not on file  Stress: Not on file  Social Connections: Unknown (08/30/2022)   Received from [facility], [facility]   Social  Network    Social Network: Not on file    Socially she was born in [place] .  She is married for 35 years.  She has 1 child, a son age 40.  She does not routinely exercise.  There is no tobacco use.  Family History:  The patient's family history includes Asthma in her mother; Heart Problems in her father; Heart disease in her brother;  Scoliosis in her son.  Father died and had history of myocardial infarction as well as aortic dissection.  Mother died and had history of gallstones.  She has 4 brothers and 1 sister; 1 brother died secondary to COVID.  ROS General: Negative; No fevers, chills, or night sweats; morbid obesity HEENT: Negative; No changes in vision or hearing, sinus congestion, difficulty swallowing Pulmonary: Negative; No cough, wheezing, shortness of breath, hemoptysis Cardiovascular: Negative; No chest pain, presyncope, syncope, palpitations GI: Negative; No nausea, vomiting, diarrhea, or abdominal pain GU: Negative; No dysuria, hematuria, or difficulty voiding Musculoskeletal: Negative; no myalgias, joint pain, or weakness Hematologic/Oncology: Negative; no easy bruising, bleeding Endocrine: Negative; no heat/cold intolerance; no diabetes Neuro: Negative; no changes in balance, headaches Skin: Negative; No rashes or skin lesions Psychiatric: Negative; No behavioral problems, depression Sleep: Negative; No snoring, daytime sleepiness, hypersomnolence, bruxism, restless legs, hypnogognic hallucinations, no cataplexy Other comprehensive 14 point system review is negative.   PHYSICAL EXAM:   VS:  BP 126/84   Pulse 73   Ht 5' 3 (1.6 m)   Wt (!) 313 lb 9.6 oz (142.2 kg)   LMP 04/02/2006   SpO2 98%   BMI 55.55 kg/m     Repeat blood pressure by me was elevated at 154/90 supine and 150/88 standing  Wt Readings from Last 3 Encounters:  12/29/23 (!) 313 lb 9.6 oz (142.2 kg)  11/19/23 (!) 313 lb (142 kg)  09/30/23 (!) 312 lb (141.5 kg)    General: Alert,  oriented, no distress.  Skin: normal turgor, no rashes, warm and dry HEENT: Normocephalic, atraumatic. Pupils equal round and reactive to light; sclera anicteric; extraocular muscles intact; Nose without nasal septal hypertrophy Mouth/Parynx benign; Mallinpatti scale 3 Neck: Thick neck no JVD, no carotid bruits; normal carotid upstroke Lungs: clear to ausculatation and percussion; no wheezing or rales Chest wall: without tenderness to palpitation Heart: PMI not displaced, RRR, s1 s2 normal, 1/6 systolic murmur, no diastolic murmur, no rubs, gallops, thrills, or heaves Abdomen: soft, nontender; no hepatosplenomehaly, BS+; abdominal aorta nontender and not dilated by palpation. Back: no CVA tenderness Pulses 2+ Musculoskeletal: full range of motion, normal strength, no joint deformities Extremities: no clubbing cyanosis or edema, Homan's sign negative  Neurologic: grossly nonfocal; Cranial nerves grossly wnl Psychologic: Normal mood and affect   Studies/Labs Reviewed:   EKG Interpretation Date/Time:  Monday December 29 2023 08:11:49 EST Ventricular Rate:  73 PR Interval:  172 QRS Duration:  80 QT Interval:  400 QTC Calculation: 440 R Axis:   55  Text Interpretation: Normal sinus rhythm with sinus arrhythmia Low voltage QRS When compared with ECG of 04-Sep-2023 08:30, No significant change was found Confirmed by Burnard Ned ([id number]) on 12/29/2023 8:25:01 AM    Recent Labs:    Latest Ref Rng & Units 05/19/2021    7:47 PM 02/15/2020    4:31 PM 02/13/2020   12:25 PM  BMP  Glucose 70 - 99 mg/dL 98  882  895   BUN 6 - 20 mg/dL 7  15  13    Creatinine 0.44 - 1.00 mg/dL 9.17  9.15  9.29   Sodium 135 - 145 mmol/L 140  142  140   Potassium 3.5 - 5.1 mmol/L 4.3  3.5  3.7   Chloride 98 - 111 mmol/L 103  105  106   CO2 22 - 32 mmol/L 25  26  20    Calcium 8.9 - 10.3 mg/dL 89.9  9.8  9.7  Latest Ref Rng & Units 05/19/2021    7:47 PM  Hepatic Function  Total Protein 6.5 - 8.1 g/dL  7.3   Albumin 3.5 - 5.0 g/dL 4.0   AST 15 - 41 U/L 28   ALT 0 - 44 U/L 29   Alk Phosphatase 38 - 126 U/L 59   Total Bilirubin 0.3 - 1.2 mg/dL 1.1        Latest Ref Rng & Units 05/19/2021    7:47 PM 02/15/2020    4:31 PM 02/21/2013    3:22 PM  CBC  WBC 4.0 - 10.5 K/uL 7.0  6.5  4.4   Hemoglobin 12.0 - 15.0 g/dL 84.1  84.0  85.2   Hematocrit 36.0 - 46.0 % 48.0  48.7  47.0   Platelets 150 - 400 K/uL 368  322     Lab Results  Component Value Date   MCV 89.7 05/19/2021   MCV 91.9 02/15/2020   MCV 93.8 02/21/2013   No results found for: TSH No results found for: HGBA1C   BNP No results found for: BNP  ProBNP No results found for: PROBNP   Lipid Panel  No results found for: CHOL, TRIG, HDL, CHOLHDL, VLDL, LDLCALC, LDLDIRECT, LABVLDL   RADIOLOGY: No results found.   Additional studies/ records that were reviewed today include:   Patient Name: Denaly, Gatling Date: 09/27/2021 Gender: Female D.O.B: 01-26-1966 Age (years): 54 Referring Provider: Lonni Nanas Height (inches): 63 Interpreting Physician: Debby Sor MD, ABSM Weight (lbs): 290 RPSGT: Cordella Phenes BMI: 51 MRN: [id number] Neck Size: 15.00   CLINICAL INFORMATION Sleep Study Type: NPSG   Indication for sleep study: Fatigue, Hypertension, Obesity   Epworth Sleepiness Score: 0   SLEEP STUDY TECHNIQUE As per the AASM Manual for the Scoring of Sleep and Associated Events v2.3 (April 2016) with a hypopnea requiring 4% desaturations.   The channels recorded and monitored were frontal, central and occipital EEG, electrooculogram (EOG), submentalis EMG (chin), nasal and oral airflow, thoracic and abdominal wall motion, anterior tibialis EMG, snore microphone, electrocardiogram, and pulse oximetry.   MEDICATIONS zolpidem  (AMBIEN ) 5 MG tablet apixaban  (ELIQUIS ) 5 MG TABS tablet cholecalciferol (VITAMIN D) 1000 UNITS tablet diltiazem  (CARDIZEM  CD) 120 MG 24 hr  capsule fish oil-omega-3 fatty acids 1000 MG capsule hydrOXYzine (VISTARIL) 25 MG capsule hyoscyamine (LEVSIN SL) 0.125 MG SL tablet MAGNESIUM PO methocarbamol  (ROBAXIN ) 500 MG tablet tiZANidine  (ZANAFLEX ) 4 MG tablet Medications self-administered by patient taken the night of the study : TIZANIDINE , PANTOPRAZOLE SODIUM, Eliquis , Magnesium Malate, TYLENOL  ARTHRITIS   SLEEP ARCHITECTURE The study was initiated at 10:25:22 PM and ended at 4:50:58 AM.   Sleep onset time was 14.9 minutes and the sleep efficiency was 64.6%%. The total sleep time was 249 minutes.   Stage REM latency was 260.5 minutes.   The patient spent 15.3%% of the night in stage N1 sleep, 64.3%% in stage N2 sleep, 0.2%% in stage N3 and 20.3% in REM.   Alpha intrusion was absent.   Supine sleep was 10.04%.   RESPIRATORY PARAMETERS The overall apnea/hypopnea index (AHI) was 5.1 per hour. The respiratory disturbance index (RDI) was 21.0/h There were 3 total apneas, including 1 obstructive, 2 central and 0 mixed apneas. There were 18 hypopneas and 66 RERAs.   The AHI during Stage REM sleep was 10.7 per hour.   AHI while supine was 4.8 per hour.   The mean oxygen saturation was 94.0%. The minimum SpO2 during sleep was 86.0%.   Mild snoring was noted  during this study.   CARDIAC DATA The 2 lead EKG demonstrated sinus rhythm. The mean heart rate was 65.5 beats per minute. Other EKG findings include: None.   LEG MOVEMENT DATA The total PLMS were 0 with a resulting PLMS index of 0.0. Associated arousal with leg movement index was 0.0 .   IMPRESSIONS - Mild obstructive sleep apnea occurred during this study (AHI  5.1/h; RDI 21.0/h); events were slightly worse with REM sleep (AHI 10.7/h). - No significant central sleep apnea occurred during this study (CAI  0.5/h). - Mild oxygen desaturation to a nadir of 86.0%. - Reduced sleep efficiency at 64.6%. Wake after sleep onset (WASO) was 121.7 minutes. -  Mild snoring. - No  cardiac abnormalities were noted during this study. - Clinically significant periodic limb movements did not occur during sleep. No significant associated arousals.   DIAGNOSIS - Obstructive Sleep Apnea (G47.33) - Nocturnal Hypoxemia (G47.36)   RECOMMENDATIONS -  In this patient with cardiovascular comorbidities including atrial fibrillation, consider therapeutic CPAP for treatment of her mild sleep disordered breathing. Recommend an initial trial of Auto-PAP at 6 - 15 cm of water. - Effort should be made to optimize nasal and oropharyngeal patency. - Alternatives to CPAP therapy such as a customized oral appliance can be considered.  - Avoid alcohol, sedatives and other CNS depressants that may worsen sleep apnea and disrupt normal sleep architecture. - Sleep hygiene should be reviewed to assess factors that may improve sleep quality. - Weight management (BMI 51) and regular exercise should be initiated or continued if appropriate.     ASSESSMENT:    1. OSA (obstructive sleep apnea)   2. Paroxysmal atrial fibrillation (HCC)   3. Current use of long term anticoagulation   4. Morbid obesity (HCC)   5. Fibromyalgia   6. Other irritable bowel syndrome    PLAN:  Christine [name] is a very pleasant 59 year old female who has a history of fibromyalgia, irritable bowel syndrome, hypertension, paroxysmal atrial fibrillation, as well as obstructive sleep apnea.  He underwent an initial sleep study referred by Dr. Lonni Mas September 27, 2021 and was found to have at least mild overall sleep apnea AHI of 5.1/h, although RDI was significantly increased at 21/h.  AHI during REM sleep was 10.7/h and O2 desaturated to a nadir of 86%.  With her super morbid obesity, atrial fibrillation, it was recommended she initiate CPAP therapy.  Unfortunately this occurred during a period of supply chain issues preventing new CPAP machines to be expeditiously delivered.  After over 1 year, she never received a  machine and ultimately bought one on her own online.  She states her husband helped her set it up.  She used CPAP for approximately 2 to 4 weeks and then ultimately discontinued use.  She never presented for an initial sleep evaluation or consultation.  Presently, she admits to not sleeping very well.  There is mild snoring.  He has had some blood pressure lability.  He also has a history of PAF.  Her blood pressure initially was 126/84 but on repeat by me blood pressure was elevated both supine and standing in the 150/88 range.  I have recommended that she bring her machine to our office so that we can access exactly what her pressure are set at and potentially make adjustments.  We we will set her up with a DME company so that she can receive supplies.  I had a lengthy discussion with her and reviewed sleep apnea and its effects  on disrupting sleep architecture.  In addition I discussed potential adverse cardiovascular consequences associated with untreated sleep apnea and in particular with reference to its effects on blood pressure control with potential resistant hypertension, nocturnal arrhythmias with increased risk for atrial fibrillation and if the patient is successfully cardioverted the significant increased risk for recurrent A-fib.  In addition I discussed its negative effects on glucose and insulin resistance, increased inflammatory markers, as well as potential for nocturnal GERD which she has been experiencing.  I discussed the improvement with sleeping on her left side.  In addition I discussed potential nocturnal hypoxemia contributing to nocturnal ischemia in the setting of underlying coronary artery or cerebrovascular disease.  I discussed potential increased nocturia with untreated sleep apnea.  She has agreed to reinitiate therapy on set up with DME and able to have supplies.  I will see her in several months for reassessment or sooner as needed.   I again discussed the importance of in  addition to the DME company that she initially was assigned to was no longer in business and she therefore never was transition to another company.  Presently, we will need to place her with a new DME company.  We will add her machine so that we can access data and link her machine to our office for our review.   Medication Adjustments/Labs and Tests Ordered: Current medicines are reviewed at length with the patient today.  Concerns regarding medicines are outlined above.  Medication changes, Labs and Tests ordered today are listed in the Patient Instructions below. Patient Instructions  Medication Instructions:  The current medical regimen is effective. Continue present plan and medications as directed. Please refer to the Current Medication list given to you today.   *If you need a refill on your cardiac medications before your next appointment, please call your pharmacy*   Lab Work: No labs were ordered during today's visit.  If you have labs (blood work) drawn today and your tests are completely normal, you will receive your results only by: MyChart Message (if you have MyChart) OR A paper copy in the mail If you have any lab test that is abnormal or we need to change your treatment, we will call you to review the results.   Testing/Procedures: No procedures ordered today.    Follow-Up: At [facility], you and your health needs are our priority.  As part of our continuing mission to provide you with exceptional heart care, we have created designated Provider Care Teams.  These Care Teams include your primary Cardiologist (physician) and Advanced Practice Providers (APPs -  Physician Assistants and Nurse Practitioners) who all work together to provide you with the care you need, when you need it.  We recommend signing up for the patient portal called MyChart.  Sign up information is provided on this After Visit Summary.  MyChart is used to connect with patients for Virtual  Visits (Telemedicine).  Patients are able to view lab/test results, encounter notes, upcoming appointments, etc.  Non-urgent messages can be sent to your provider as well.   To learn more about what you can do with MyChart, go to [email or web address].    Your next appointment:   3 month(s)  Provider:   Dr. Debby Sor     Other Instructions If you have any questions or concerns regarding your c-pap, bi-pap or sleep accessories, please contact Brandie Rorie at [phone].     Thank you for choosing [facility]!  Signed, Debby Sor, MD, [facility], ABSM Diplomate, American Board of Sleep Medicine    12/29/2023 5:25 PM    [facility] [address], [place], [place]  [address] Phone: [phone]

## 2023-12-29 NOTE — Patient Instructions (Addendum)
 Medication Instructions:  The current medical regimen is effective. Continue present plan and medications as directed. Please refer to the Current Medication list given to you today.   *If you need a refill on your cardiac medications before your next appointment, please call your pharmacy*   Lab Work: No labs were ordered during today's visit.  If you have labs (blood work) drawn today and your tests are completely normal, you will receive your results only by: MyChart Message (if you have MyChart) OR A paper copy in the mail If you have any lab test that is abnormal or we need to change your treatment, we will call you to review the results.   Testing/Procedures: No procedures ordered today.    Follow-Up: At [facility], you and your health needs are our priority.  As part of our continuing mission to provide you with exceptional heart care, we have created designated Provider Care Teams.  These Care Teams include your primary Cardiologist (physician) and Advanced Practice Providers (APPs -  Physician Assistants and Nurse Practitioners) who all work together to provide you with the care you need, when you need it.  We recommend signing up for the patient portal called MyChart.  Sign up information is provided on this After Visit Summary.  MyChart is used to connect with patients for Virtual Visits (Telemedicine).  Patients are able to view lab/test results, encounter notes, upcoming appointments, etc.  Non-urgent messages can be sent to your provider as well.   To learn more about what you can do with MyChart, go to [email or web address].    Your next appointment:   3 month(s)  Provider:   Dr. Debby Sor     Other Instructions If you have any questions or concerns regarding your c-pap, bi-pap or sleep accessories, please contact Brandie Rorie at [phone].     Thank you for choosing [facility]!

## 2024-01-06 ENCOUNTER — Other Ambulatory Visit (HOSPITAL_COMMUNITY): Payer: Self-pay | Admitting: Physician Assistant

## 2024-01-06 ENCOUNTER — Other Ambulatory Visit: Payer: Self-pay | Admitting: Rheumatology

## 2024-01-07 NOTE — Telephone Encounter (Signed)
Last Fill: 04/15/2023 (Ambien) 09/25/2023 (Methocarbamol)   Next Visit: 03/25/2024  Last Visit: 09/24/2024  Dx: Primary insomnia Fibromyalgia   Current Dose per office note on 09/25/2023: ambien 5 mg 1 tablet at bedtime as needed for insomnia. methocarbamol 500 mg twice daily    Okay to refill Ambien?

## 2024-01-28 ENCOUNTER — Other Ambulatory Visit: Payer: Self-pay | Admitting: Rheumatology

## 2024-01-28 NOTE — Telephone Encounter (Signed)
Last Fill: 09/25/2023  Next Visit: 03/25/2024  Last Visit: 09/25/2023  Dx: Fibromyalgia   Current Dose per office note on 09/25/2023: tizanidine 4 mg at bedtime as needed for muscle spasms.   Okay to refill Tizanidine?

## 2024-03-01 ENCOUNTER — Other Ambulatory Visit: Payer: Self-pay | Admitting: Cardiology

## 2024-03-01 DIAGNOSIS — I480 Paroxysmal atrial fibrillation: Secondary | ICD-10-CM

## 2024-03-02 NOTE — Telephone Encounter (Signed)
 Prescription refill request for Eliquis received. Indication:afib Last office visit:9/24 ZDG:UYQIH LABS Age: 58 Weight:142.2  kg  Prescription refilled

## 2024-03-04 ENCOUNTER — Ambulatory Visit (HOSPITAL_COMMUNITY)
Admission: RE | Admit: 2024-03-04 | Discharge: 2024-03-04 | Disposition: A | Discharge status: Home / Self Care | Payer: 59 | Source: Ambulatory Visit | Attending: Physician Assistant | Admitting: Physician Assistant

## 2024-03-04 ENCOUNTER — Encounter (HOSPITAL_COMMUNITY): Payer: Self-pay | Admitting: Physician Assistant

## 2024-03-04 VITALS — BP 124/88 | HR 78 | Ht 63.0 in | Wt 319.2 lb

## 2024-03-04 DIAGNOSIS — K589 Irritable bowel syndrome without diarrhea: Secondary | ICD-10-CM | POA: Diagnosis not present

## 2024-03-04 DIAGNOSIS — Z6841 Body Mass Index (BMI) 40.0 and over, adult: Secondary | ICD-10-CM | POA: Diagnosis not present

## 2024-03-04 DIAGNOSIS — I119 Hypertensive heart disease without heart failure: Secondary | ICD-10-CM | POA: Diagnosis not present

## 2024-03-04 DIAGNOSIS — I340 Nonrheumatic mitral (valve) insufficiency: Secondary | ICD-10-CM | POA: Diagnosis not present

## 2024-03-04 DIAGNOSIS — Z5181 Encounter for therapeutic drug level monitoring: Secondary | ICD-10-CM | POA: Diagnosis not present

## 2024-03-04 DIAGNOSIS — I480 Paroxysmal atrial fibrillation: Secondary | ICD-10-CM | POA: Insufficient documentation

## 2024-03-04 DIAGNOSIS — G4733 Obstructive sleep apnea (adult) (pediatric): Secondary | ICD-10-CM | POA: Diagnosis not present

## 2024-03-04 DIAGNOSIS — Z7901 Long term (current) use of anticoagulants: Secondary | ICD-10-CM | POA: Diagnosis not present

## 2024-03-04 DIAGNOSIS — M797 Fibromyalgia: Secondary | ICD-10-CM | POA: Diagnosis not present

## 2024-03-04 DIAGNOSIS — E669 Obesity, unspecified: Secondary | ICD-10-CM | POA: Diagnosis not present

## 2024-03-04 DIAGNOSIS — Z79899 Other long term (current) drug therapy: Secondary | ICD-10-CM | POA: Insufficient documentation

## 2024-03-04 NOTE — Progress Notes (Signed)
 Primary Care Physician: Lance Bosch, NP Primary Cardiologist: Dr Bjorn Pippin Primary Electrophysiologist: none Referring Physician: [facility]   Christine [name] is a 58 y.o. female with a history of fibromyalgia, IBS, OSA, HTN, POTS, and atrial fibrillation who presents for follow up in the [facility].  The patient was initially diagnosed with atrial fibrillation 05/19/21 after presenting to the ED with palpitations. She has had palpitations and chest pain off and on but workup thus far had not revealed any arrhythmias. ECG on presentation to the ED showed afib with RVR. She spontaneously converted to SR. Patient has a CHADS2VASC score of 1. She was given PRN diltiazem.   Patient presented to the ED at [facility] 08/30/22 with palpitations and heart racing. Her Apple Watch alerted her that she was in afib. She converted spontaneously to SR prior to evaluation at the ED. She was started on flecainide 11/20/22.  Patient returns for follow up for atrial fibrillation and flecainide monitoring. She reports that she has done well from an afib standpoint. She has not had any afib alerts on her smart watch. She did have an episode of elevated heart rate last week associated with position change, lasted ~2 hours. Similar to previous POTS episodes.   Today, he denies symptoms of palpitations, chest pain, shortness of breath, orthopnea, PND, lower extremity edema, dizziness, presyncope, syncope, bleeding, or neurologic sequela. The patient is tolerating medications without difficulties and is otherwise without complaint today.    Atrial Fibrillation Risk Factors:  she does have symptoms or diagnosis of sleep apnea. she does not have a history of rheumatic fever. she does not have a history of alcohol use. The patient does not have a history of early familial atrial fibrillation or other arrhythmias.   Atrial Fibrillation Management history:  Previous  antiarrhythmic drugs: flecainide  Previous cardioversions: none Previous ablations: none Anticoagulation history: Eliquis   Past Medical History:  Diagnosis Date   A-fib (HCC)    Allergy    Diverticulitis    Fibromyalgia    POTS (postural orthostatic tachycardia syndrome)    per patient   Current Outpatient Medications  Medication Sig Dispense Refill   albuterol (VENTOLIN HFA) 108 (90 Base) MCG/ACT inhaler Inhale 1-2 puffs into the lungs every 6 (six) hours as needed for shortness of breath or wheezing.     ELIQUIS 5 MG TABS tablet TAKE ONE TABLET BY MOUTH TWICE A DAY 60 tablet 5   flecainide (TAMBOCOR) 50 MG tablet TAKE ONE TABLET BY MOUTH TWICE A DAY 180 tablet 1   hyoscyamine (LEVSIN SL) 0.125 MG SL tablet Place 0.125 mg under the tongue as needed for cramping.     Levomefolic Acid (5-MTHF) 1 MG CAPS Take by mouth daily.     MAGNESIUM PO Take 1 tablet by mouth at bedtime.     methocarbamol (ROBAXIN) 500 MG tablet TAKE ONE TABLET BY MOUTH TWICE A DAY AS NEEDED FOR MUSCLE SPASMS 60 tablet 2   metoprolol succinate (TOPROL XL) 25 MG 24 hr tablet Take 0.5 tablets (12.5 mg total) by mouth daily. 30 tablet 6   Multiple Vitamin (MULTIVITAMIN PO) Take by mouth daily.     pantoprazole (PROTONIX) 40 MG tablet Take 40 mg by mouth every other day.     rizatriptan (MAXALT) 10 MG tablet Take 10 mg by mouth as needed for migraine.     tiZANidine (ZANAFLEX) 4 MG tablet TAKE ONE TABLET BY MOUTH AT BEDTIME AS NEEDED FOR MUSCLE SPASM  90 tablet 0   Vitamin D-Vitamin K (VITAMIN K2-VITAMIN D3 PO) Take by mouth daily.     zolpidem (AMBIEN) 5 MG tablet TAKE ONE TABLET BY MOUTH AT BEDTIME AS NEEDED FOR SLEEP 30 tablet 0   No current facility-administered medications for this encounter.    ROS- All systems are reviewed and negative except as per the HPI above.  Physical Exam: Vitals:   03/04/24 0838  BP: 124/88  Pulse: 78  Weight: (!) 144.8 kg  Height: 5\' 3"  (1.6 m)    GEN: Well nourished, well  developed in no acute distress CARDIAC: Regular rate and rhythm, no murmurs, rubs, gallops RESPIRATORY:  Clear to auscultation without rales, wheezing or rhonchi  ABDOMEN: Soft, non-tender, non-distended EXTREMITIES:  No edema; No deformity    Wt Readings from Last 3 Encounters:  03/04/24 (!) 144.8 kg  12/29/23 (!) 142.2 kg  11/19/23 (!) 142 kg    EKG today demonstrates  SR Vent. rate 78 BPM PR interval 186 ms QRS duration 72 ms QT/QTcB 388/442 ms   Echo 08/06/21 demonstrated  1. Left ventricular ejection fraction, by estimation, is >75%. The left  ventricle has hyperdynamic function. The left ventricle has no regional  wall motion abnormalities. There is mild left ventricular hypertrophy.  Left ventricular diastolic parameters were normal.   2. Right ventricular systolic function is normal. The right ventricular  size is normal.   3. Mild mitral valve regurgitation.   4. The aortic valve is tricuspid. Aortic valve regurgitation is not  visualized. Mild aortic valve sclerosis is present, with no evidence of  aortic valve stenosis.   5. The inferior vena cava is normal in size with greater than 50%  respiratory variability, suggesting right atrial pressure of 3 mmHg.   Comparison(s): The left ventricular function is unchanged.   Epic records are reviewed at length today  CHA2DS2-VASc Score = 2  The patient's score is based upon: CHF History: 0 HTN History: 1 Diabetes History: 0 Stroke History: 0 Vascular Disease History: 0 Age Score: 0 Gender Score: 1       ASSESSMENT AND PLAN: Paroxysmal Atrial Fibrillation (ICD10:  I48.0) The patient's CHA2DS2-VASc score is 2, indicating a 2.2% annual risk of stroke.   Patient appears to be maintaining SR Continue flecainide 50 mg BID Continue Toprol 12.5 mg daily Continue Eliquis 5 mg BID  High Risk Medication Monitoring (ICD 10: Z79.899) Intervals on ECG acceptable for flecainide monitoring.      Obesity Body mass  index is 56.54 kg/m.  Encouraged lifestyle modification  OSA  Encouraged nightly CPAP Followed by Dr Tresa Endo  HTN Stable on current regimen   Follow up in the AF clinic in 6 months.    [name] PA-C [facility] [address] [place], [place] [address] [phone] 03/04/2024 9:27 AM

## 2024-03-07 ENCOUNTER — Ambulatory Visit (INDEPENDENT_AMBULATORY_CARE_PROVIDER_SITE_OTHER)

## 2024-03-07 ENCOUNTER — Other Ambulatory Visit: Payer: Self-pay

## 2024-03-07 ENCOUNTER — Ambulatory Visit (HOSPITAL_COMMUNITY)
Admission: EM | Admit: 2024-03-07 | Discharge: 2024-03-07 | Disposition: A | Discharge status: Home / Self Care | Attending: Emergency Medicine | Admitting: Emergency Medicine

## 2024-03-07 ENCOUNTER — Encounter (HOSPITAL_COMMUNITY): Payer: Self-pay | Admitting: Emergency Medicine

## 2024-03-07 DIAGNOSIS — R1011 Right upper quadrant pain: Secondary | ICD-10-CM

## 2024-03-07 DIAGNOSIS — R071 Chest pain on breathing: Secondary | ICD-10-CM

## 2024-03-07 DIAGNOSIS — M7918 Myalgia, other site: Secondary | ICD-10-CM | POA: Diagnosis not present

## 2024-03-07 DIAGNOSIS — R0789 Other chest pain: Secondary | ICD-10-CM | POA: Diagnosis not present

## 2024-03-07 DIAGNOSIS — R600 Localized edema: Secondary | ICD-10-CM | POA: Diagnosis not present

## 2024-03-07 DIAGNOSIS — M62838 Other muscle spasm: Secondary | ICD-10-CM

## 2024-03-07 LAB — POC COVID19/FLU A&B COMBO
Covid Antigen, POC: NEGATIVE
Influenza A Antigen, POC: NEGATIVE
Influenza B Antigen, POC: NEGATIVE

## 2024-03-07 MED ORDER — BACLOFEN 10 MG PO TABS: 10.0000 mg | ORAL_TABLET | Freq: Every day | ORAL | 0 refills | Status: AC

## 2024-03-07 MED ORDER — LEVALBUTEROL TARTRATE 45 MCG/ACT IN AERO
2.0000 | INHALATION_SPRAY | Freq: Four times a day (QID) | RESPIRATORY_TRACT | 12 refills | Status: AC
Start: 2024-03-07 — End: ?

## 2024-03-07 MED ORDER — HYDROCHLOROTHIAZIDE 25 MG PO TABS
25.0000 mg | ORAL_TABLET | Freq: Every morning | ORAL | 0 refills | Status: DC
Start: 2024-03-07 — End: 2024-10-11

## 2024-03-07 NOTE — Discharge Instructions (Addendum)
 The mainstay of therapy for musculoskeletal pain is reduction of inflammation and relaxation of tension which is causing inflammation.  Keep in mind, pain always begets more pain.  To help you stay ahead of your pain and inflammation, I have provided the following regimen for you:   This evening, please begin taking baclofen 10 mg instead of Tizanidine to see if this works better.  Baclofen is a highly effective muscle relaxer and antispasmodic which should continue to provide you with relaxation of your tense muscles, allow you to sleep well and to keep your pain under control.   During the day, please set aside time to apply ice to the affected area 4 times daily for 20 minutes each application.  This can be achieved by using a bag of frozen peas or corn, a Ziploc bag filled with ice and water, or Ziploc bag filled with half rubbing alcohol and half Dawn dish detergent, frozen into a slush.  Please be careful not to apply ice directly to your skin, always place a soft cloth between you and the ice pack.  Over-the-counter products such as IcyHot and Biofreeze do not work nearly as well.  To help with your work of breathing, I provided you with a prescription for levalbuterol, Xopenex.  Please inhale 2 puffs 4 times daily for the next Avril days to see if this improves your work of breathing.  To address your elevated diastolic blood pressure and the presence of edema in your lungs, I recommend that you begin taking hydrochlorothiazide once daily for the next several days.  I have sent a prescription to your pharmacy.  In the next few days, if your feelings of chest tightness, lung pain worsen or you begin to feel significantly short of breath, please go to the emergency room for further evaluation.  A CT scan of your lungs may be indicated.  Thank you for visiting Christine [name] today.  We appreciate the opportunity to participate in your [name].

## 2024-03-07 NOTE — ED Provider Notes (Signed)
 [facility]    CSN: [id number] Arrival date & time: 03/07/24  1051    HISTORY   Chief Complaint  Patient presents with   Headache   Shoulder Pain   Back Pain   HPI Christine [name] is a pleasant, 58 y.o. female with a history of atrial fibrillation on anticoagulation, intracranial hypertension, family history of gallstones in mother, postural orthostatic tachycardia syndrome, fibromyalgia, osteoarthritis, bursitis, IBS and migraines who presents to urgent care today. Patient complains of pain radiating to her right shoulder, right upper back causing her to have a headache for the past 2 days.  Patient states the headache got so bad that she stopped her blood thinners at that she can take an ibuprofen which she states did not help much.  Patient states headache does not feel similar to the headaches that her intracranial hypertension causes her.  Patient states that she also feels like her right lung has been hurting, particularly when she takes a deep breath and states the pain radiates to her right shoulder as well.  Denies cough, shortness of breath, paroxysmal nocturnal dyspnea, wheezing.  Patient denies numbness, weakness or tingling in upper extremities.  Patient reports seeing her cardiologist earlier this week and that she was advised that her heart is doing well.  The history is provided by the patient.   Past Medical History:  Diagnosis Date   A-fib [facility])    Allergy    Diverticulitis    Fibromyalgia    POTS (postural orthostatic tachycardia syndrome)    per patient   Patient Active Problem List   Diagnosis Date Noted   Encounter for monitoring flecainide therapy 03/04/2024   Snoring 09/27/2021   Paroxysmal atrial fibrillation (HCC) 05/24/2021   IBS (irritable bowel syndrome) 10/18/2016   Fibromyalgia 10/18/2016   Fatigue 10/18/2016   Primary insomnia 10/18/2016   Osteoarthritis of lumbar spine 10/18/2016   Trochanteric bursitis of both hips 10/18/2016    Primary osteoarthritis of both knees 10/18/2016   Chondromalacia, patella, unspecified laterality 10/18/2016   Bilateral calcaneal spurs 10/18/2016   Vitamin D deficiency 10/18/2016   UTI (urinary tract infection) 11/08/2014   Allergic rhinitis 02/21/2013   Migraine 02/21/2013   Past Surgical History:  Procedure Laterality Date   ABDOMINAL HYSTERECTOMY     CESAREAN SECTION     COLONOSCOPY  01/09/2023   FINGER SURGERY     left ring finger x 2   FRACTURE SURGERY     TUBAL LIGATION     UPPER GI ENDOSCOPY  01/09/2023   OB History   No obstetric history on file.    Home Medications    Prior to Admission medications   Medication Sig Start Date End Date Taking? Authorizing Provider  albuterol (VENTOLIN HFA) 108 (90 Base) MCG/ACT inhaler Inhale 1-2 puffs into the lungs every 6 (six) hours as needed for shortness of breath or wheezing. 10/24/23   [provider]  ELIQUIS 5 MG TABS tablet TAKE ONE TABLET BY MOUTH TWICE A DAY 03/02/24   Little Ishikawa, MD  flecainide (TAMBOCOR) 50 MG tablet TAKE ONE TABLET BY MOUTH TWICE A DAY 01/07/24   Fenton, Clint R, PA  hyoscyamine (LEVSIN SL) 0.125 MG SL tablet Place 0.125 mg under the tongue as needed for cramping. 09/06/19   [provider]  Levomefolic Acid (5-MTHF) 1 MG CAPS Take by mouth daily.    [provider]  MAGNESIUM PO Take 1 tablet by mouth at bedtime.    [provider]  methocarbamol (ROBAXIN) 500 MG tablet TAKE ONE TABLET BY MOUTH TWICE A DAY AS NEEDED FOR MUSCLE SPASMS 01/07/24   Pollyann Savoy, MD  metoprolol succinate (TOPROL XL) 25 MG 24 hr tablet Take 0.5 tablets (12.5 mg total) by mouth daily. 09/04/23 09/03/24  Fenton, Clint R, PA  Multiple Vitamin (MULTIVITAMIN PO) Take by mouth daily.    [provider]  pantoprazole (PROTONIX) 40 MG tablet Take 40 mg by mouth every other day. 05/29/22   [provider]  rizatriptan (MAXALT) 10 MG tablet Take 10 mg by mouth as  needed for migraine. 12/19/23   [provider]  tiZANidine (ZANAFLEX) 4 MG tablet TAKE ONE TABLET BY MOUTH AT BEDTIME AS NEEDED FOR MUSCLE SPASM 01/28/24   Pollyann Savoy, MD  Vitamin D-Vitamin K (VITAMIN K2-VITAMIN D3 PO) Take by mouth daily.    [provider]  zolpidem (AMBIEN) 5 MG tablet TAKE ONE TABLET BY MOUTH AT BEDTIME AS NEEDED FOR SLEEP 01/07/24   Pollyann Savoy, MD    Family History Family History  Problem Relation Age of Onset   Asthma Mother    Heart Problems Father    Heart disease Brother    Scoliosis Son    Migraines Neg Hx    Headache Neg Hx    Social History Social History   Tobacco Use   Smoking status: Former    Current packs/day: 0.00    Average packs/day: 0.3 packs/day for 2.0 years (0.5 ttl pk-yrs)    Types: Cigarettes    Start date: 02/21/1986    Quit date: 02/22/1988    Years since quitting: 36.0    Passive exposure: Past   Smokeless tobacco: Never   Tobacco comments:    Former smoker 09/04/23  Vaping Use   Vaping status: Never Used  Substance Use Topics   Alcohol use: Yes    Alcohol/week: 1.0 standard drink of alcohol    Types: 1 Glasses of wine per week    Comment: monthly   Drug use: No   Allergies   Other, Penicillins, and Sulfa antibiotics  Review of Systems Review of Systems Pertinent findings revealed after performing a 14 point review of systems has been noted in the history of present illness.  Physical Exam Vital Signs BP (!) 179/88   Pulse 72   Temp 98.6 F (37 C) (Oral)   Resp 18   LMP 04/02/2006   SpO2 100%   No data found.  Physical Exam Vitals and nursing note reviewed.  Constitutional:      General: She is awake. She is not in acute distress.    Appearance: Normal appearance. She is well-developed and well-groomed. She is morbidly obese. She is not ill-appearing.  HENT:     Head: Normocephalic and atraumatic.  Eyes:     Pupils: Pupils are equal, round, and reactive to light.  Cardiovascular:      Rate and Rhythm: Normal rate and regular rhythm.     Heart sounds: Normal heart sounds, S1 normal and S2 normal.  Pulmonary:     Effort: Pulmonary effort is normal. No tachypnea or bradypnea.     Breath sounds: Normal breath sounds and air entry. No stridor, decreased air movement or transmitted upper airway sounds. No decreased breath sounds, wheezing, rhonchi or rales.  Abdominal:     General: Abdomen is protuberant. Bowel sounds are normal.     Palpations: Abdomen is soft.     Tenderness: There is no abdominal tenderness.  Musculoskeletal:  General: Normal range of motion.     Cervical back: Normal range of motion and neck supple.  Skin:    General: Skin is warm and dry.  Neurological:     General: No focal deficit present.     Mental Status: She is alert and oriented to person, place, and time. Mental status is at baseline.     Cranial Nerves: Cranial nerves 2-12 are intact.     Sensory: Sensation is intact.     Motor: Motor function is intact.     Coordination: Coordination is intact.     Gait: Gait is intact.  Psychiatric:        Mood and Affect: Mood normal.        Behavior: Behavior normal. Behavior is cooperative.        Thought Content: Thought content normal.        Judgment: Judgment normal.     Visual Acuity Right Eye Distance:   Left Eye Distance:   Bilateral Distance:    Right Eye Near:   Left Eye Near:    Bilateral Near:     UC Couse / Diagnostics / Procedures:     Radiology DG Cervical Spine Complete Result Date: 03/07/2024 CLINICAL DATA:  Right shoulder pain and neck pain.  Headache. EXAM: CERVICAL SPINE - COMPLETE 4+ VIEW COMPARISON:  None Available. FINDINGS: The cervical spine is visualized from the occiput to T1. Straightening of the normal cervical lordosis without subluxation or fracture. Prevertebral soft tissues are within normal limits. Vertebral body and disc space heights are normal. Mild uncovertebral hypertrophy in the mid and  lower cervical spine. Neural foramina are patent. IMPRESSION: Straightening of the normal cervical lordosis with uncovertebral hypertrophy in the mid and lower cervical spine. Electronically Signed   By: Leanna Battles M.D.   On: 03/07/2024 12:56   DG Abd 2 Views Result Date: 03/07/2024 CLINICAL DATA:  Pain. EXAM: ABDOMEN - 2 VIEW COMPARISON:  None Available. FINDINGS: Scattered gas and stool in the colon. No small bowel dilatation. Phleboliths in the anatomic pelvis. IMPRESSION: No acute findings. Electronically Signed   By: Leanna Battles M.D.   On: 03/07/2024 12:55   DG Chest 2 View Result Date: 03/07/2024 CLINICAL DATA:  Right-sided chest pain. EXAM: CHEST - 2 VIEW COMPARISON:  10/28/2022. FINDINGS: Trachea is midline. Heart size is accentuated by apical lordotic technique. Thoracic aorta is calcified. Mild interstitial prominence and indistinctness bilaterally. No pleural fluid. IMPRESSION: Lungs appear mildly edematous. Electronically Signed   By: Leanna Battles M.D.   On: 03/07/2024 12:54    Procedures Procedures (including critical care time) EKG  Pending results:  Labs Reviewed  POC COVID19/FLU A&B COMBO - Normal    Medications Ordered in UC: Medications - No data to display  UC Diagnoses / Final Clinical Impressions(s)   I have reviewed the triage vital signs and the nursing notes.  Pertinent labs & imaging results that were available during my care of the patient were reviewed by me and considered in my medical decision making (see chart for details).    Final diagnoses:  RUQ pain  Myalgia of muscle of neck  Cervical paraspinous muscle spasm  Localized edema  Feeling of chest tightness  EKG performed 3 days ago was reviewed by me and not concerning for acute myocardial injury or dysfunction. Patient was provided with printout of all x-ray findings during her visit today.  X-ray of cervical spine concerning for abnormal lordosis likely caused by spasm of cervical  paraspinous  muscles, right upper trapezius.  Recommend baclofen for treatment of muscle spasm.  Abdominal imaging not concerning for infection, bowel obstruction, entrapped gas.  Chest x-ray concerning for lung edema.  Patient not showing signs of acute respiratory distress at this time, no concern for pulmonary edema otherwise.  Patient does have an elevated diastolic blood pressure.  Patient provided with diuretic to lower diastolic blood pressure and possibly decrease edema and lungs at this time.  Given patient's known IIH, patient may better benefit from Diamox however she reports she may have an allergy to Diamox given her known sulfa allergy and we also do not have recent serum CO2 level to ensure Diamox can be taken safely at this time.  Have advised patient to discuss beginning Diamox with her regular provider, particularly if hydrochlorothiazide is not helpful.  Patient requesting a refill of her Xopenex, this was provided for her.  Please see discharge instructions below for further details of plan of care as provided to patient. ED Prescriptions     Medication Sig Dispense Auth. Provider   levalbuterol [facility] HFA) 45 MCG/ACT inhaler Inhale 2 puffs into the lungs 4 (four) times daily. 1 each Theadora Rama Scales, PA-C   hydrochlorothiazide (HYDRODIURIL) 25 MG tablet Take 1 tablet (25 mg total) by mouth in the morning. 30 tablet Theadora Rama Scales, PA-C   baclofen (LIORESAL) 10 MG tablet Take 1 tablet (10 mg total) by mouth at bedtime for 7 days. 7 tablet Theadora Rama Scales, PA-C      PDMP not reviewed this encounter.  Disposition Upon Discharge:  Condition: stable for discharge home Home: take medications as prescribed; routine discharge instructions as discussed; follow up as advised.  Patient presented with an acute illness with associated systemic symptoms and significant discomfort requiring urgent management. In my opinion, this is a condition that a prudent lay  person (someone who possesses an average knowledge of health and medicine) may potentially expect to result in complications if not addressed urgently such as respiratory distress, impairment of bodily function or dysfunction of bodily organs.   Routine symptom specific, illness specific and/or disease specific instructions were discussed with the patient and/or caregiver at length.   As such, the patient has been evaluated and assessed, work-up was performed and treatment was provided in alignment with urgent care protocols and evidence based medicine.  Patient/parent/caregiver has been advised that the patient may require follow up for further testing and treatment if the symptoms continue in spite of treatment, as clinically indicated and appropriate.  Patient/parent/caregiver has been advised to return to the [facility] or PCP in 3-5 days if no better; to PCP or the Emergency Department if new signs and symptoms develop, or if the current signs or symptoms continue to change or worsen for further workup, evaluation and treatment as clinically indicated and appropriate  The patient will follow up with their current PCP if and as advised. If the patient does not currently have a PCP we will assist them in obtaining one.   The patient may need specialty follow up if the symptoms continue, in spite of conservative treatment and management, for further workup, evaluation, consultation and treatment as clinically indicated and appropriate.  Patient/parent/caregiver verbalized understanding and agreement of plan as discussed.  All questions were addressed during visit.  Please see discharge instructions below for further details of plan.    Discharge Instructions      The mainstay of therapy for musculoskeletal pain is reduction of inflammation and relaxation of tension which  is causing inflammation.  Keep in mind, pain always begets more pain.  To help you stay ahead of your pain and inflammation, I have  provided the following regimen for you:   This evening, please begin taking baclofen 10 mg instead of Tizanidine to see if this works better.  Baclofen is a highly effective muscle relaxer and antispasmodic which should continue to provide you with relaxation of your tense muscles, allow you to sleep well and to keep your pain under control.   During the day, please set aside time to apply ice to the affected area 4 times daily for 20 minutes each application.  This can be achieved by using a bag of frozen peas or corn, a Ziploc bag filled with ice and water, or Ziploc bag filled with half rubbing alcohol and half Dawn dish detergent, frozen into a slush.  Please be careful not to apply ice directly to your skin, always place a soft cloth between you and the ice pack.  Over-the-counter products such as IcyHot and Biofreeze do not work nearly as well.  To help with your work of breathing, I provided you with a prescription for levalbuterol, Xopenex.  Please inhale 2 puffs 4 times daily for the next Avril days to see if this improves your work of breathing.  To address your elevated diastolic blood pressure and the presence of edema in your lungs, I recommend that you begin taking hydrochlorothiazide once daily for the next several days.  I have sent a prescription to your pharmacy.  In the next few days, if your feelings of chest tightness, lung pain worsen or you begin to feel significantly short of breath, please go to the emergency room for further evaluation.  A CT scan of your lungs may be indicated.  Thank you for visiting [facility] today.  We appreciate the opportunity to participate in your care.        This office note has been dictated using Teaching laboratory technician.  Unfortunately, this method of dictation can sometimes lead to typographical or grammatical errors.  I apologize for your inconvenience in advance if this occurs.  Please do not hesitate to reach out to  me if clarification is needed.      Theadora Rama Scales, PA-C 03/08/24 1335

## 2024-03-07 NOTE — ED Triage Notes (Signed)
 Pt reports pain on her right shoulder, right upper back and HA for the past few days. Pt states "I feel like my lungs are hurting me". Was seen by Cardiologist this week and he states everything is good on her.

## 2024-03-11 NOTE — Progress Notes (Unsigned)
 Office Visit Note  Patient: Christine [name]             Date of Birth: February 17, 1966           MRN: [id number]             PCP: Lance Bosch, NP Referring: Lance Bosch, NP Visit Date: 03/25/2024 Occupation: @GUAROCC @  Subjective:  No chief complaint on file.   History of Present Illness: Christine [name] is a 58 y.o. female with history of fibromyalgia and osteoarthritis.     Robaxin Zanaflex  Ambien   Activities of Daily Living:  Patient reports morning stiffness for *** {minute/hour:19697}.   Patient {ACTIONS;DENIES/REPORTS:21021675::"Denies"} nocturnal pain.  Difficulty dressing/grooming: {ACTIONS;DENIES/REPORTS:21021675::"Denies"} Difficulty climbing stairs: {ACTIONS;DENIES/REPORTS:21021675::"Denies"} Difficulty getting out of chair: {ACTIONS;DENIES/REPORTS:21021675::"Denies"} Difficulty using hands for taps, buttons, cutlery, and/or writing: {ACTIONS;DENIES/REPORTS:21021675::"Denies"}  No Rheumatology ROS completed.   PMFS History:  Patient Active Problem List   Diagnosis Date Noted   Encounter for monitoring flecainide therapy 03/04/2024   Snoring 09/27/2021   Paroxysmal atrial fibrillation (HCC) 05/24/2021   IBS (irritable bowel syndrome) 10/18/2016   Fibromyalgia 10/18/2016   Fatigue 10/18/2016   Primary insomnia 10/18/2016   Osteoarthritis of lumbar spine 10/18/2016   Trochanteric bursitis of both hips 10/18/2016   Primary osteoarthritis of both knees 10/18/2016   Chondromalacia, patella, unspecified laterality 10/18/2016   Bilateral calcaneal spurs 10/18/2016   Vitamin D deficiency 10/18/2016   UTI (urinary tract infection) 11/08/2014   Allergic rhinitis 02/21/2013   Migraine 02/21/2013    Past Medical History:  Diagnosis Date   A-fib (HCC)    Allergy    Diverticulitis    Fibromyalgia    POTS (postural orthostatic tachycardia syndrome)    per patient    Family History  Problem Relation Age of Onset   Asthma Mother    Heart Problems  Father    Heart disease Brother    Scoliosis Son    Migraines Neg Hx    Headache Neg Hx    Past Surgical History:  Procedure Laterality Date   ABDOMINAL HYSTERECTOMY     CESAREAN SECTION     COLONOSCOPY  01/09/2023   FINGER SURGERY     left ring finger x 2   FRACTURE SURGERY     TUBAL LIGATION     UPPER GI ENDOSCOPY  01/09/2023   Social History   Social History Narrative   Not on file   Immunization History  Administered Date(s) Administered   PFIZER(Purple Top)SARS-COV-2 Vaccination 03/12/2020, 04/02/2020   Tdap 06/28/2018     Objective: Vital Signs: LMP 04/02/2006    Physical Exam Vitals and nursing note reviewed.  Constitutional:      Appearance: She is well-developed.  HENT:     Head: Normocephalic and atraumatic.  Eyes:     Conjunctiva/sclera: Conjunctivae normal.  Cardiovascular:     Rate and Rhythm: Normal rate and regular rhythm.     Heart sounds: Normal heart sounds.  Pulmonary:     Effort: Pulmonary effort is normal.     Breath sounds: Normal breath sounds.  Abdominal:     General: Bowel sounds are normal.     Palpations: Abdomen is soft.  Musculoskeletal:     Cervical back: Normal range of motion.  Lymphadenopathy:     Cervical: No cervical adenopathy.  Skin:    General: Skin is warm and dry.     Capillary Refill: Capillary refill takes less than 2 seconds.  Neurological:     Mental Status: She is  alert and oriented to person, place, and time.  Psychiatric:        Behavior: Behavior normal.      Musculoskeletal Exam: ***  CDAI Exam: CDAI Score: -- Patient Global: --; Provider Global: -- Swollen: --; Tender: -- Joint Exam 03/25/2024   No joint exam has been documented for this visit   There is currently no information documented on the homunculus. Go to the Rheumatology activity and complete the homunculus joint exam.  Investigation: No additional findings.  Imaging: DG Cervical Spine Complete Result Date: 03/07/2024 CLINICAL  DATA:  Right shoulder pain and neck pain.  Headache. EXAM: CERVICAL SPINE - COMPLETE 4+ VIEW COMPARISON:  None Available. FINDINGS: The cervical spine is visualized from the occiput to T1. Straightening of the normal cervical lordosis without subluxation or fracture. Prevertebral soft tissues are within normal limits. Vertebral body and disc space heights are normal. Mild uncovertebral hypertrophy in the mid and lower cervical spine. Neural foramina are patent. IMPRESSION: Straightening of the normal cervical lordosis with uncovertebral hypertrophy in the mid and lower cervical spine. Electronically Signed   By: Leanna Battles M.D.   On: 03/07/2024 12:56   DG Abd 2 Views Result Date: 03/07/2024 CLINICAL DATA:  Pain. EXAM: ABDOMEN - 2 VIEW COMPARISON:  None Available. FINDINGS: Scattered gas and stool in the colon. No small bowel dilatation. Phleboliths in the anatomic pelvis. IMPRESSION: No acute findings. Electronically Signed   By: Leanna Battles M.D.   On: 03/07/2024 12:55   DG Chest 2 View Result Date: 03/07/2024 CLINICAL DATA:  Right-sided chest pain. EXAM: CHEST - 2 VIEW COMPARISON:  10/28/2022. FINDINGS: Trachea is midline. Heart size is accentuated by apical lordotic technique. Thoracic aorta is calcified. Mild interstitial prominence and indistinctness bilaterally. No pleural fluid. IMPRESSION: Lungs appear mildly edematous. Electronically Signed   By: Leanna Battles M.D.   On: 03/07/2024 12:54    Recent Labs: Lab Results  Component Value Date   WBC 7.0 05/19/2021   HGB 15.8 (H) 05/19/2021   PLT 368 05/19/2021   NA 140 05/19/2021   K 4.3 05/19/2021   CL 103 05/19/2021   CO2 25 05/19/2021   GLUCOSE 98 05/19/2021   BUN 7 05/19/2021   CREATININE 0.82 05/19/2021   BILITOT 1.1 05/19/2021   ALKPHOS 59 05/19/2021   AST 28 05/19/2021   ALT 29 05/19/2021   PROT 7.3 05/19/2021   ALBUMIN 4.0 05/19/2021   CALCIUM 10.0 05/19/2021   GFRAA >60 02/15/2020    Speciality Comments: No  specialty comments available.  Procedures:  No procedures performed Allergies: Other, Penicillins, and Sulfa antibiotics   Assessment / Plan:     Visit Diagnoses: Fibromyalgia  Lateral epicondylitis, right elbow  Other fatigue  Primary insomnia  Trochanteric bursitis of both hips  Primary osteoarthritis of both knees  Bilateral calcaneal spurs  Degeneration of intervertebral disc of lumbar region without discogenic back pain or lower extremity pain  POTS (postural orthostatic tachycardia syndrome)  Vitamin D deficiency  History of migraine  History of IBS  Orders: No orders of the defined types were placed in this encounter.  No orders of the defined types were placed in this encounter.   Face-to-face time spent with patient was *** minutes. Greater than 50% of time was spent in counseling and coordination of care.  Follow-Up Instructions: No follow-ups on file.   Gearldine Bienenstock, PA-C  Note - This record has been created using Dragon software.  Chart creation errors have been sought, but may  not always  have been located. Such creation errors do not reflect on  the standard of medical care.

## 2024-03-25 ENCOUNTER — Ambulatory Visit: Payer: 59 | Attending: Physician Assistant | Admitting: Physician Assistant

## 2024-03-25 ENCOUNTER — Encounter: Payer: Self-pay | Admitting: Physician Assistant

## 2024-03-25 VITALS — BP 140/78 | HR 77 | Resp 15 | Ht 63.0 in | Wt 303.0 lb

## 2024-03-25 DIAGNOSIS — F5101 Primary insomnia: Secondary | ICD-10-CM | POA: Diagnosis not present

## 2024-03-25 DIAGNOSIS — E559 Vitamin D deficiency, unspecified: Secondary | ICD-10-CM

## 2024-03-25 DIAGNOSIS — M51369 Other intervertebral disc degeneration, lumbar region without mention of lumbar back pain or lower extremity pain: Secondary | ICD-10-CM

## 2024-03-25 DIAGNOSIS — M7731 Calcaneal spur, right foot: Secondary | ICD-10-CM

## 2024-03-25 DIAGNOSIS — R5383 Other fatigue: Secondary | ICD-10-CM | POA: Diagnosis not present

## 2024-03-25 DIAGNOSIS — M797 Fibromyalgia: Secondary | ICD-10-CM

## 2024-03-25 DIAGNOSIS — M7711 Lateral epicondylitis, right elbow: Secondary | ICD-10-CM

## 2024-03-25 DIAGNOSIS — M7062 Trochanteric bursitis, left hip: Secondary | ICD-10-CM

## 2024-03-25 DIAGNOSIS — M7732 Calcaneal spur, left foot: Secondary | ICD-10-CM

## 2024-03-25 DIAGNOSIS — M170 Bilateral primary osteoarthritis of knee: Secondary | ICD-10-CM

## 2024-03-25 DIAGNOSIS — Z8719 Personal history of other diseases of the digestive system: Secondary | ICD-10-CM

## 2024-03-25 DIAGNOSIS — M7061 Trochanteric bursitis, right hip: Secondary | ICD-10-CM

## 2024-03-25 DIAGNOSIS — G90A Postural orthostatic tachycardia syndrome (POTS): Secondary | ICD-10-CM

## 2024-03-25 DIAGNOSIS — Z8669 Personal history of other diseases of the nervous system and sense organs: Secondary | ICD-10-CM

## 2024-03-25 MED ORDER — BACLOFEN 10 MG PO TABS: 10.0000 mg | ORAL_TABLET | Freq: Every evening | ORAL | 0 refills | Status: DC | PRN

## 2024-04-07 ENCOUNTER — Ambulatory Visit: Payer: 59 | Admitting: Cardiovascular Disease

## 2024-04-24 ENCOUNTER — Other Ambulatory Visit: Payer: Self-pay | Admitting: Physician Assistant

## 2024-04-26 NOTE — Telephone Encounter (Signed)
 Last Fill: 03/25/2024  Next Visit: 09/24/2024  Last Visit: 03/25/2024  Dx:  Fibromyalgia   Current Dose per office note on 03/25/2024: baclofen  10 mg 1 tablet at bedtime as needed for muscle spasms   Okay to refill Baclofen ?

## 2024-05-12 ENCOUNTER — Other Ambulatory Visit: Payer: Self-pay | Admitting: Rheumatology

## 2024-05-12 NOTE — Telephone Encounter (Signed)
 Last Fill: 01/07/2024  Next Visit: 09/24/2024  Last Visit: 03/25/2024  Dx: Fibromyalgia   Current Dose per office note on 03/25/2024: methocarbamol  500 mg 1 tablet twice daily as needed for muscle spasms.   Okay to refill Methocarbamol ?

## 2024-05-27 ENCOUNTER — Other Ambulatory Visit: Payer: Self-pay | Admitting: Physician Assistant

## 2024-05-27 NOTE — Telephone Encounter (Signed)
 Last Fill: 04/26/2024  Next Visit: 09/24/2024  Last Visit: 03/25/2024  Dx: Fibromyalgia   Current Dose per office note on 03/25/2024: baclofen  10 mg 1 tablet at bedtime as needed   Okay to refill Baclofen ?

## 2024-06-08 ENCOUNTER — Ambulatory Visit: Admitting: Cardiovascular Disease

## 2024-06-26 ENCOUNTER — Other Ambulatory Visit: Payer: Self-pay | Admitting: Physician Assistant

## 2024-06-27 NOTE — Telephone Encounter (Signed)
 Last Fill: 05/27/2024  Next Visit: 09/24/2024  Last Visit: 03/25/2024  Dx: Fibromyalgia   Current Dose per office note on 03/25/2024: baclofen  10 mg 1 tablet at bedtime as needed for muscle spasms   Okay to refill Baclofen ?

## 2024-07-13 ENCOUNTER — Other Ambulatory Visit (HOSPITAL_COMMUNITY): Payer: Self-pay | Admitting: Physician Assistant

## 2024-07-15 ENCOUNTER — Ambulatory Visit: Admitting: Cardiology

## 2024-08-30 ENCOUNTER — Other Ambulatory Visit: Payer: Self-pay | Admitting: Physician Assistant

## 2024-08-30 NOTE — Telephone Encounter (Signed)
 Last Fill: 06/28/2024  Next Visit: 09/23/2024  Last Visit: 03/25/2024  DX: Fibromyalgia   Current Dose per office note on 03/25/2024: A new prescription for baclofen  10 mg 1 tablet at bedtime as needed for muscle spasms was sent to the pharmacy today.   Okay to refill baclofen ?

## 2024-09-09 ENCOUNTER — Ambulatory Visit (HOSPITAL_COMMUNITY): Admitting: Physician Assistant

## 2024-09-09 NOTE — Progress Notes (Deleted)
 Office Visit Note  Patient: Christine [name]             Date of Birth: 1966/07/27           MRN: [id number]             PCP: Barbra Odor, NP Referring: Barbra Odor, NP Visit Date: 09/23/2024 Occupation: SECRETARY  Subjective:    History of Present Illness: Christine [name] is a 58 y.o. female with history of fibromyalgia and osteoarthritis.     Baclofen   Robaxin   Ambien    Activities of Daily Living:  Patient reports morning stiffness for *** {minute/hour:19697}.   Patient {ACTIONS;DENIES/REPORTS:21021675::Denies} nocturnal pain.  Difficulty dressing/grooming: {ACTIONS;DENIES/REPORTS:21021675::Denies} Difficulty climbing stairs: {ACTIONS;DENIES/REPORTS:21021675::Denies} Difficulty getting out of chair: {ACTIONS;DENIES/REPORTS:21021675::Denies} Difficulty using hands for taps, buttons, cutlery, and/or writing: {ACTIONS;DENIES/REPORTS:21021675::Denies}  No Rheumatology ROS completed.   PMFS History:  Patient Active Problem List   Diagnosis Date Noted   Encounter for monitoring flecainide  therapy 03/04/2024   Snoring 09/27/2021   Paroxysmal atrial fibrillation (HCC) 05/24/2021   IBS (irritable bowel syndrome) 10/18/2016   Fibromyalgia 10/18/2016   Fatigue 10/18/2016   Primary insomnia 10/18/2016   Osteoarthritis of lumbar spine 10/18/2016   Trochanteric bursitis of both hips 10/18/2016   Primary osteoarthritis of both knees 10/18/2016   Chondromalacia, patella, unspecified laterality 10/18/2016   Bilateral calcaneal spurs 10/18/2016   Vitamin D deficiency 10/18/2016   UTI (urinary tract infection) 11/08/2014   Allergic rhinitis 02/21/2013   Migraine 02/21/2013    Past Medical History:  Diagnosis Date   A-fib (HCC)    Allergy    Diverticulitis    Fibromyalgia    POTS (postural orthostatic tachycardia syndrome)    per patient    Family History  Problem Relation Age of Onset   Asthma Mother    Heart Problems Father    Heart disease  Brother    Scoliosis Son    Migraines Neg Hx    Headache Neg Hx    Past Surgical History:  Procedure Laterality Date   ABDOMINAL HYSTERECTOMY     CESAREAN SECTION     COLONOSCOPY  01/09/2023   FINGER SURGERY     left ring finger x 2   FRACTURE SURGERY     TUBAL LIGATION     UPPER GI ENDOSCOPY  01/09/2023   Social History   Tobacco Use   Smoking status: Former    Current packs/day: 0.00    Average packs/day: 0.3 packs/day for 2.0 years (0.5 ttl pk-yrs)    Types: Cigarettes    Start date: 02/21/1986    Quit date: 02/22/1988    Years since quitting: 36.5    Passive exposure: Past   Smokeless tobacco: Never   Tobacco comments:    Former smoker 09/04/23  Vaping Use   Vaping status: Never Used  Substance Use Topics   Alcohol use: Yes    Comment: occ   Drug use: No   Social History   Social History Narrative   Not on file     Immunization History  Administered Date(s) Administered   PFIZER(Purple Top)SARS-COV-2 Vaccination 03/12/2020, 04/02/2020   Tdap 06/28/2018     Objective: Vital Signs: LMP 04/02/2006    Physical Exam Vitals and nursing note reviewed.  Constitutional:      Appearance: She is well-developed.  HENT:     Head: Normocephalic and atraumatic.  Eyes:     Conjunctiva/sclera: Conjunctivae normal.  Cardiovascular:     Rate and Rhythm: Normal rate and regular rhythm.  Heart sounds: Normal heart sounds.  Pulmonary:     Effort: Pulmonary effort is normal.     Breath sounds: Normal breath sounds.  Abdominal:     General: Bowel sounds are normal.     Palpations: Abdomen is soft.  Musculoskeletal:     Cervical back: Normal range of motion.  Lymphadenopathy:     Cervical: No cervical adenopathy.  Skin:    General: Skin is warm and dry.     Capillary Refill: Capillary refill takes less than 2 seconds.  Neurological:     Mental Status: She is alert and oriented to person, place, and time.  Psychiatric:        Behavior: Behavior normal.       Musculoskeletal Exam: ***  CDAI Exam: CDAI Score: -- Patient Global: --; Provider Global: -- Swollen: --; Tender: -- Joint Exam 09/23/2024   No joint exam has been documented for this visit   There is currently no information documented on the homunculus. Go to the Rheumatology activity and complete the homunculus joint exam.  Investigation: No additional findings.  Imaging: No results found.  Recent Labs: Lab Results  Component Value Date   WBC 7.0 05/19/2021   HGB 15.8 (H) 05/19/2021   PLT 368 05/19/2021   NA 140 05/19/2021   K 4.3 05/19/2021   CL 103 05/19/2021   CO2 25 05/19/2021   GLUCOSE 98 05/19/2021   BUN 7 05/19/2021   CREATININE 0.82 05/19/2021   BILITOT 1.1 05/19/2021   ALKPHOS 59 05/19/2021   AST 28 05/19/2021   ALT 29 05/19/2021   PROT 7.3 05/19/2021   ALBUMIN 4.0 05/19/2021   CALCIUM 10.0 05/19/2021   GFRAA >60 02/15/2020    Speciality Comments: No specialty comments available.  Procedures:  No procedures performed Allergies: Other, Penicillins, and Sulfa antibiotics   Assessment / Plan:     Visit Diagnoses: No diagnosis found.  Orders: No orders of the defined types were placed in this encounter.  No orders of the defined types were placed in this encounter.   Face-to-face time spent with patient was *** minutes. Greater than 50% of time was spent in counseling and coordination of care.  Follow-Up Instructions: No follow-ups on file.   Shelba SHAUNNA Potters, RT  Note - This record has been created using [employer].  Chart creation errors have been sought, but may not always  have been located. Such creation errors do not reflect on  the standard of medical care.

## 2024-09-22 ENCOUNTER — Ambulatory Visit (HOSPITAL_COMMUNITY): Admitting: Physician Assistant

## 2024-09-23 ENCOUNTER — Ambulatory Visit: Admitting: Physician Assistant

## 2024-09-23 DIAGNOSIS — F5101 Primary insomnia: Secondary | ICD-10-CM

## 2024-09-23 DIAGNOSIS — Z8719 Personal history of other diseases of the digestive system: Secondary | ICD-10-CM

## 2024-09-23 DIAGNOSIS — M7731 Calcaneal spur, right foot: Secondary | ICD-10-CM

## 2024-09-23 DIAGNOSIS — R5383 Other fatigue: Secondary | ICD-10-CM

## 2024-09-23 DIAGNOSIS — M7711 Lateral epicondylitis, right elbow: Secondary | ICD-10-CM

## 2024-09-23 DIAGNOSIS — M797 Fibromyalgia: Secondary | ICD-10-CM

## 2024-09-23 DIAGNOSIS — M170 Bilateral primary osteoarthritis of knee: Secondary | ICD-10-CM

## 2024-09-23 DIAGNOSIS — E559 Vitamin D deficiency, unspecified: Secondary | ICD-10-CM

## 2024-09-23 DIAGNOSIS — G90A Postural orthostatic tachycardia syndrome (POTS): Secondary | ICD-10-CM

## 2024-09-23 DIAGNOSIS — M7061 Trochanteric bursitis, right hip: Secondary | ICD-10-CM

## 2024-09-23 DIAGNOSIS — Z8669 Personal history of other diseases of the nervous system and sense organs: Secondary | ICD-10-CM

## 2024-09-23 DIAGNOSIS — M51369 Other intervertebral disc degeneration, lumbar region without mention of lumbar back pain or lower extremity pain: Secondary | ICD-10-CM

## 2024-09-24 ENCOUNTER — Ambulatory Visit: Admitting: Physician Assistant

## 2024-10-05 NOTE — Progress Notes (Signed)
 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 female with history of fibromyalgia and osteoarthritis.   Patient reports that she has had increased fatigue recently.  Patient will be having updated lab work within the next few weeks for further evaluation.  Patient states that she is currently in the process of packing to move to [place] later this month.  She has noticed some increased discomfort in her hands with repetitive and overuse activities while packing.  She has also had ongoing discomfort in her lower back.  She experiences trapezius muscle tension and spasms intermittently.  She takes methocarbamol  500 mg twice daily and baclofen  10 mg at bedtime as needed for muscle spasms.  She takes Ambien  5 mg very sparingly on nights that she does not take baclofen .  Activities of Daily Living:  Patient reports morning stiffness for 10-15 minutes. Patient Reports nocturnal pain.  Difficulty dressing/grooming: Denies Difficulty climbing stairs: Reports Difficulty getting out of chair: Denies Difficulty using hands for taps, buttons, cutlery, and/or writing: Denies  Review of Systems  Constitutional:  Positive for fatigue.  HENT:  Negative for mouth sores and mouth dryness.   Eyes:  Negative for dryness.  Respiratory:  Negative for shortness of breath.   Cardiovascular:  Negative for chest pain and palpitations.  Gastrointestinal:  Negative for blood in stool, constipation and diarrhea.  Endocrine: Negative for increased urination.  Genitourinary:  Negative for involuntary urination.  Musculoskeletal:  Positive for joint pain, joint pain, myalgias, muscle weakness, morning stiffness, muscle tenderness and myalgias. Negative for  gait problem and joint swelling.  Skin:  Positive for hair loss and sensitivity to sunlight. Negative for color change and rash.  Allergic/Immunologic: Positive for susceptible to infections.  Neurological:  Negative for dizziness and headaches.  Hematological:  Negative for swollen glands.  Psychiatric/Behavioral:  Positive for sleep disturbance. Negative for depressed mood. The patient is nervous/anxious.     PMFS History:  Patient Active Problem List   Diagnosis Date Noted   Encounter for monitoring flecainide  therapy 03/04/2024   Snoring 09/27/2021   Paroxysmal atrial fibrillation (HCC) 05/24/2021   IBS (irritable bowel syndrome) 10/18/2016   Fibromyalgia 10/18/2016   Fatigue 10/18/2016   Primary insomnia 10/18/2016   Osteoarthritis of lumbar spine 10/18/2016   Trochanteric bursitis of both hips 10/18/2016   Primary osteoarthritis of both knees 10/18/2016   Chondromalacia, patella, unspecified laterality 10/18/2016   Bilateral calcaneal spurs 10/18/2016   Vitamin D deficiency 10/18/2016   UTI (urinary tract infection) 11/08/2014   Allergic rhinitis 02/21/2013   Migraine 02/21/2013    Past Medical History:  Diagnosis Date   A-fib (HCC)    Allergy    Diverticulitis    Fibromyalgia    POTS (postural orthostatic tachycardia syndrome)    per patient    Family History  Problem Relation Age of Onset   Asthma Mother    Heart Problems Father    Heart disease Brother    Scoliosis Son    Migraines Neg Hx    Headache Neg Hx    Past Surgical History:  Procedure Laterality Date  ABDOMINAL HYSTERECTOMY     CESAREAN SECTION     COLONOSCOPY  01/09/2023   FINGER SURGERY     left ring finger x 2   FRACTURE SURGERY     TUBAL LIGATION     UPPER GI ENDOSCOPY  01/09/2023   Social History   Tobacco Use   Smoking status: Former    Current packs/day: 0.00    Average packs/day: 0.3 packs/day for 2.0 years (0.5 ttl pk-yrs)    Types: Cigarettes    Start date: 02/21/1986    Quit  date: 02/22/1988    Years since quitting: 36.6    Passive exposure: Past   Smokeless tobacco: Never   Tobacco comments:    Former smoker 09/04/23  Vaping Use   Vaping status: Never Used  Substance Use Topics   Alcohol use: Yes    Comment: occ   Drug use: No   Social History   Social History Narrative   Not on file     Immunization History  Administered Date(s) Administered   PFIZER(Purple Top)SARS-COV-2 Vaccination 03/12/2020, 04/02/2020   Tdap 06/28/2018     Objective: Vital Signs: BP (!) 162/91   Pulse 71   Temp 97.8 F (36.6 C)   Resp 16   Ht 5' 3.5 (1.613 m)   Wt (!) 320 lb 12.8 oz (145.5 kg)   LMP 04/02/2006   BMI 55.94 kg/m    Physical Exam Vitals and nursing note reviewed.  Constitutional:      Appearance: She is well-developed.  HENT:     Head: Normocephalic and atraumatic.  Eyes:     Conjunctiva/sclera: Conjunctivae normal.  Cardiovascular:     Rate and Rhythm: Normal rate and regular rhythm.     Heart sounds: Normal heart sounds.  Pulmonary:     Effort: Pulmonary effort is normal.     Breath sounds: Normal breath sounds.  Abdominal:     General: Bowel sounds are normal.     Palpations: Abdomen is soft.  Musculoskeletal:     Cervical back: Normal range of motion.  Lymphadenopathy:     Cervical: No cervical adenopathy.  Skin:    General: Skin is warm and dry.     Capillary Refill: Capillary refill takes less than 2 seconds.  Neurological:     Mental Status: She is alert and oriented to person, place, and time.  Psychiatric:        Behavior: Behavior normal.      Musculoskeletal Exam: Generalized hyperalgesia and positive tender points on exam.  C-spine has good range of motion.  Trapezius muscle tension and tenderness bilaterally. Postural thoracic kyphosis. Discomfort and limited mobility of lumbar spine.  Midline spinal tenderness.  Shoulder joints, elbow joints, wrist joints, MCPs, PIPs, DIPs have good range of motion with no synovitis.   Complete fist formation bilaterally.  Hip joints have good range of motion with no groin pain. Tenderness over the left trochanteric bursa.   Knee joints have good range of motion no warmth or effusion.  Ankle joints have good range of motion no tenderness or joint swelling.  Pedal edema noted.  CDAI Exam: CDAI Score: -- Patient Global: --; Provider Global: -- Swollen: --; Tender: -- Joint Exam 10/19/2024   No joint exam has been documented for this visit   There is currently no information documented on the homunculus. Go to the Rheumatology activity and complete the homunculus joint exam.  Investigation: No additional findings.  Imaging: No results found.  Recent Labs: Lab Results  Component Value Date   WBC 7.0 05/19/2021   HGB 15.8 (H) 05/19/2021   PLT 368 05/19/2021   NA 140 05/19/2021   K 4.3 05/19/2021   CL 103 05/19/2021   CO2 25 05/19/2021   GLUCOSE 98 05/19/2021   BUN 7 05/19/2021   CREATININE 0.82 05/19/2021   BILITOT 1.1 05/19/2021   ALKPHOS 59 05/19/2021   AST 28 05/19/2021   ALT 29 05/19/2021   PROT 7.3 05/19/2021   ALBUMIN 4.0 05/19/2021   CALCIUM 10.0 05/19/2021   GFRAA >60 02/15/2020    Speciality Comments: No specialty comments available.  Procedures:  No procedures performed Allergies: Fexofenadine, Other, Penicillins, and Sulfa antibiotics   Assessment / Plan:     Visit Diagnoses: Fibromyalgia -She has generalized hyperalgesia and positive tender points on exam.  She presents today with trapezius muscle tension and tenderness bilaterally.  She has been experiencing muscle spasms intermittently in her upper back and lower back.  She takes methocarbamol  500 mg twice daily and baclofen  10 mg at bedtime as needed for muscle spasms.  She takes Ambien  5 mg 1 tablet at bedtime sparingly on nights that she does not take baclofen . She is currently in the process of preparing to move to [place] so she has been having to perform more repetitive and overuse  activities.  She has had some increased myofascial pain and fatigue which she attributes to being under increased physical and mental stress. Discussed the importance of regular exercise and good sleep hygiene.  She will follow up in 6 months or sooner if needed.    Lateral epicondylitis, right elbow: Intermittent discomfort.    Other fatigue: Chronic fatigue--she will be having updated lab work within the next couple of weeks for further evaluation.   Discussed the importance of good sleep hygiene.   Primary insomnia: She takes ambien  5 mg at bedtime sparingly for insomnia.  Discussed the importance of good sleep hygiene.   Trochanteric bursitis of both hips: Intermittent discomfort. She has tenderness over the left trochanteric bursa.  Patient would notify us  if she would like to return for a cortisone injection in the future.  Primary osteoarthritis of both knees: She has good ROM of both knees with no warmth or effusion.  Discussed the importance of lower extremity muscle strengthening.   Bilateral calcaneal spurs: She is wearing proper fitting shoes.    Degeneration of intervertebral disc of lumbar region without discogenic back pain or lower extremity pain: Chronic pain.  Difficulty standing or walking prolonged distances due to the discomfort.   Other medical conditions are listed as follows:   POTS (postural orthostatic tachycardia syndrome)  Vitamin D deficiency: She is taking a daily vitamin D supplement.   History of migraine  History of IBS  Orders: No orders of the defined types were placed in this encounter.  No orders of the defined types were placed in this encounter.    Follow-Up Instructions: Return in about 6 months (around 04/18/2025) for Fibromyalgia, Osteoarthritis.   Waddell CHRISTELLA Craze, PA-C  Note - This record has been created using Dragon software.  Chart creation errors have been sought, but may not always  have been located. Such creation errors do not  reflect on  the standard of medical care.

## 2024-10-06 ENCOUNTER — Other Ambulatory Visit: Payer: Self-pay | Admitting: Cardiology

## 2024-10-06 ENCOUNTER — Other Ambulatory Visit: Payer: Self-pay | Admitting: Physician Assistant

## 2024-10-06 ENCOUNTER — Other Ambulatory Visit: Payer: Self-pay | Admitting: Rheumatology

## 2024-10-06 DIAGNOSIS — I480 Paroxysmal atrial fibrillation: Secondary | ICD-10-CM

## 2024-10-06 NOTE — Telephone Encounter (Signed)
 Last Fill: 05/12/2024  Next Visit: 10/19/2024  Last Visit: 03/25/2024  Dx:  Fibromyalgia   Current Dose per office note on 03/25/2024: methocarbamol  500 mg 1 tablet twice daily as needed for muscle spasms.   Okay to refill Methocarbamol ?

## 2024-10-06 NOTE — Telephone Encounter (Signed)
 Pt last saw Clint Fenton, PA on 03/04/24, last labs 08/30/22 Creat 0.87, pt has upcoming appt with Clint Fenton in afib clinic on 10/11/24, placed note on appt needs CBC and BMP f/u Eliquis  labs at [facility]! Age 58, weight 137.4kg, based on specified criteria pt is on appropriate dosage of Eliquis  5mg  BID for afib.  Will refill rx.

## 2024-10-06 NOTE — Telephone Encounter (Signed)
 Last Fill: 08/30/2024  Next Visit: 10/19/2024  Last Visit: 03/25/2024  Dx: Fibromyalgia   Current Dose per office note on 03/25/2024: baclofen  10 mg 1 tablet at bedtime as needed for muscle spasms   Okay to refill Baclofen ?

## 2024-10-11 ENCOUNTER — Ambulatory Visit (HOSPITAL_COMMUNITY)
Admission: RE | Admit: 2024-10-11 | Discharge: 2024-10-11 | Disposition: A | Discharge status: Home / Self Care | Source: Ambulatory Visit | Attending: Physician Assistant | Admitting: Physician Assistant

## 2024-10-11 VITALS — BP 160/90 | HR 80 | Ht 63.0 in | Wt 319.0 lb

## 2024-10-11 DIAGNOSIS — Z5181 Encounter for therapeutic drug level monitoring: Secondary | ICD-10-CM

## 2024-10-11 DIAGNOSIS — I480 Paroxysmal atrial fibrillation: Secondary | ICD-10-CM

## 2024-10-11 DIAGNOSIS — Z79899 Other long term (current) drug therapy: Secondary | ICD-10-CM

## 2024-10-11 DIAGNOSIS — I4891 Unspecified atrial fibrillation: Secondary | ICD-10-CM

## 2024-10-11 NOTE — Progress Notes (Signed)
 Primary Care Physician: Barbra Odor, NP Primary Cardiologist: Dr Kate Primary Electrophysiologist: none Referring Physician: [facility]   Christine [name] is a 58 y.o. female with a history of fibromyalgia, IBS, OSA, HTN, POTS, and atrial fibrillation who presents for follow up in the [facility].  The patient was initially diagnosed with atrial fibrillation 05/19/21 after presenting to the ED with palpitations. She has had palpitations and chest pain off and on but workup thus far had not revealed any arrhythmias. ECG on presentation to the ED showed afib with RVR. She spontaneously converted to SR. Patient has a CHADS2VASC score of 1. She was given PRN diltiazem .   Patient presented to the ED at [facility] 08/30/22 with palpitations and heart racing. Her Apple Watch alerted her that she was in afib. She converted spontaneously to SR prior to evaluation at the ED. She was started on flecainide  11/20/22.  Patient returns for follow up for atrial fibrillation and flecainide  monitoring. She reports that she has done well from an afib standpoint. No interim symptoms of afib. No bleeding issues on anticoagulation. She is fatigued often but admits she is under stress as they are moving to [place] soon.   Today, she  denies symptoms of palpitations, chest pain, shortness of breath, orthopnea, PND, lower extremity edema, dizziness, presyncope, syncope, bleeding, or neurologic sequela. The patient is tolerating medications without difficulties and is otherwise without complaint today.    Atrial Fibrillation Risk Factors:  she does have symptoms or diagnosis of sleep apnea. she does not have a history of rheumatic fever. she does not have a history of alcohol use. The patient does not have a history of early familial atrial fibrillation or other arrhythmias.   Atrial Fibrillation Management history:  Previous antiarrhythmic drugs: flecainide   Previous  cardioversions: none Previous ablations: none Anticoagulation history: Eliquis    Past Medical History:  Diagnosis Date   A-fib (HCC)    Allergy    Diverticulitis    Fibromyalgia    POTS (postural orthostatic tachycardia syndrome)    per patient   Current Outpatient Medications  Medication Sig Dispense Refill   apixaban  (ELIQUIS ) 5 MG TABS tablet Take 1 tablet (5 mg total) by mouth 2 (two) times daily. OVERDUE for follow-up labwork, MUST have labs done for FUTURE refills. 60 tablet 0   baclofen  (LIORESAL ) 10 MG tablet TAKE ONE TABLET BY MOUTH AT BEDTIME AS NEEDED FOR MUSCLE SPASMS 30 tablet 0   flecainide  (TAMBOCOR ) 50 MG tablet TAKE ONE TABLET BY MOUTH TWICE A DAY 180 tablet 1   hyoscyamine (LEVSIN SL) 0.125 MG SL tablet Place 0.125 mg under the tongue as needed for cramping.     levalbuterol  (XOPENEX  HFA) 45 MCG/ACT inhaler Inhale 2 puffs into the lungs 4 (four) times daily. 1 each 12   MAGNESIUM PO Take 1 tablet by mouth at bedtime.     methocarbamol  (ROBAXIN ) 500 MG tablet TAKE ONE TABLET BY MOUTH TWICE A DAY AS NEEDED FOR MUSCLE SPASMS 60 tablet 2   metoprolol  succinate (TOPROL  XL) 25 MG 24 hr tablet Take 0.5 tablets (12.5 mg total) by mouth daily. 30 tablet 6   Multiple Vitamin (MULTIVITAMIN PO) Take by mouth daily. (Patient taking differently: Take 1 tablet by mouth daily.)     pantoprazole (PROTONIX) 40 MG tablet Take 40 mg by mouth every other day.     rizatriptan (MAXALT) 10 MG tablet Take 10 mg by mouth as needed for migraine.  Vitamin D-Vitamin K (VITAMIN K2-VITAMIN D3 PO) Take by mouth daily. (Patient taking differently: Take 1 tablet by mouth daily.)     zolpidem  (AMBIEN ) 5 MG tablet TAKE ONE TABLET BY MOUTH AT BEDTIME AS NEEDED FOR SLEEP 30 tablet 0   No current facility-administered medications for this encounter.    ROS- All systems are reviewed and negative except as per the HPI above.  Physical Exam: Vitals:   10/11/24 0932  BP: (!) 160/90  Pulse: 80   Weight: (!) 144.7 kg  Height: 5' 3 (1.6 m)    GEN: Well nourished, well developed in no acute distress CARDIAC: Regular rate and rhythm, no murmurs, rubs, gallops RESPIRATORY:  Clear to auscultation without rales, wheezing or rhonchi  ABDOMEN: Soft, non-tender, non-distended EXTREMITIES:  No edema; No deformity    Wt Readings from Last 3 Encounters:  10/11/24 (!) 144.7 kg  03/25/24 (!) 137.4 kg  03/04/24 (!) 144.8 kg    EKG today demonstrates  SR Vent. rate 80 BPM PR interval 176 ms QRS duration 70 ms QT/QTcB 380/438 ms   Echo 08/06/21 demonstrated  1. Left ventricular ejection fraction, by estimation, is >75%. The left  ventricle has hyperdynamic function. The left ventricle has no regional  wall motion abnormalities. There is mild left ventricular hypertrophy.  Left ventricular diastolic parameters were normal.   2. Right ventricular systolic function is normal. The right ventricular  size is normal.   3. Mild mitral valve regurgitation.   4. The aortic valve is tricuspid. Aortic valve regurgitation is not  visualized. Mild aortic valve sclerosis is present, with no evidence of  aortic valve stenosis.   5. The inferior vena cava is normal in size with greater than 50%  respiratory variability, suggesting right atrial pressure of 3 mmHg.   Comparison(s): The left ventricular function is unchanged.   Epic records are reviewed at length today   CHA2DS2-VASc Score = 2  The patient's score is based upon: CHF History: 0 HTN History: 1 Diabetes History: 0 Stroke History: 0 Vascular Disease History: 0 Age Score: 0 Gender Score: 1       ASSESSMENT AND PLAN: Paroxysmal Atrial Fibrillation (ICD10:  I48.0) The patient's CHA2DS2-VASc score is 2, indicating a 2.2% annual risk of stroke.   Patient appears to be maintaining SR Continue flecainide  50 mg BID Continue Toprol  12.5 mg daily Continue Eliquis  5 mg BID  High Risk Medication Monitoring (ICD 10:  Z79.899) Patient requires ongoing monitoring for anti-arrhythmic medication which has the potential to cause life threatening arrhythmias. Intervals on ECG acceptable for flecainide  monitoring.     Obesity Body mass index is 56.51 kg/m.  Encouraged lifestyle modification  OSA  Encouraged nightly CPAP  HTN Stable on current regimen   Follow up with Dr Inocencio to establish care in [place]. AF clinic in one year.    Daril Kicks PA-C [facility] [address] [place], [place] [address] [phone] 10/11/2024 9:45 AM

## 2024-10-14 ENCOUNTER — Telehealth: Payer: Self-pay | Admitting: Cardiology

## 2024-10-14 NOTE — Telephone Encounter (Signed)
 Patient wants to schedule a time she can bring her sleep equipment in to be checked.

## 2024-10-14 NOTE — Telephone Encounter (Signed)
 Reached out to patient by telephone left message to call us  back to discuss another dme as her old one no longer services cpap.

## 2024-10-18 ENCOUNTER — Ambulatory Visit: Admitting: Cardiology

## 2024-10-19 ENCOUNTER — Ambulatory Visit: Attending: Physician Assistant | Admitting: Physician Assistant

## 2024-10-19 ENCOUNTER — Encounter: Payer: Self-pay | Admitting: Physician Assistant

## 2024-10-19 VITALS — BP 162/91 | HR 71 | Temp 97.8°F | Resp 16 | Ht 63.5 in | Wt 320.8 lb

## 2024-10-19 DIAGNOSIS — G90A Postural orthostatic tachycardia syndrome (POTS): Secondary | ICD-10-CM

## 2024-10-19 DIAGNOSIS — Z8669 Personal history of other diseases of the nervous system and sense organs: Secondary | ICD-10-CM

## 2024-10-19 DIAGNOSIS — M7711 Lateral epicondylitis, right elbow: Secondary | ICD-10-CM

## 2024-10-19 DIAGNOSIS — F5101 Primary insomnia: Secondary | ICD-10-CM | POA: Diagnosis not present

## 2024-10-19 DIAGNOSIS — M7061 Trochanteric bursitis, right hip: Secondary | ICD-10-CM

## 2024-10-19 DIAGNOSIS — M7062 Trochanteric bursitis, left hip: Secondary | ICD-10-CM

## 2024-10-19 DIAGNOSIS — M51369 Other intervertebral disc degeneration, lumbar region without mention of lumbar back pain or lower extremity pain: Secondary | ICD-10-CM

## 2024-10-19 DIAGNOSIS — R5383 Other fatigue: Secondary | ICD-10-CM | POA: Diagnosis not present

## 2024-10-19 DIAGNOSIS — E559 Vitamin D deficiency, unspecified: Secondary | ICD-10-CM

## 2024-10-19 DIAGNOSIS — Z8719 Personal history of other diseases of the digestive system: Secondary | ICD-10-CM

## 2024-10-19 DIAGNOSIS — M797 Fibromyalgia: Secondary | ICD-10-CM

## 2024-10-19 DIAGNOSIS — M7732 Calcaneal spur, left foot: Secondary | ICD-10-CM

## 2024-10-19 DIAGNOSIS — M7731 Calcaneal spur, right foot: Secondary | ICD-10-CM

## 2024-10-19 DIAGNOSIS — M170 Bilateral primary osteoarthritis of knee: Secondary | ICD-10-CM

## 2024-11-08 ENCOUNTER — Other Ambulatory Visit (HOSPITAL_COMMUNITY): Payer: Self-pay | Admitting: Physician Assistant

## 2024-11-08 ENCOUNTER — Other Ambulatory Visit: Payer: Self-pay | Admitting: Physician Assistant

## 2024-11-08 NOTE — Telephone Encounter (Signed)
 Last Fill: 10/06/2024  Next Visit: 04/20/2025  Last Visit: 10/19/2024  Dx: Fibromyalgia   Current Dose per office note on 10/19/2024: baclofen  10 mg at bedtime as needed for muscle spasms.   Okay to refill Baclofen ?

## 2024-11-17 NOTE — Patient Instructions (Incomplete)
 Below is our plan:  We will continue to monitor. Continue focusing on weight management. Consider seeing a weight management provider.   Please make sure you are staying well hydrated. I recommend 50-60 ounces daily. Well balanced diet and regular exercise encouraged. Consistent sleep schedule with 6-8 hours recommended.   Please continue follow up with care team as directed.   Follow up with me in 1 year   You may receive a survey regarding today's visit. I encourage you to leave honest feed back as I do use this information to improve patient care. Thank you for seeing me today!   GENERAL HEADACHE INFORMATION:   Natural supplements: Magnesium Oxide or Magnesium Glycinate 500 mg at bed (up to 800 mg daily) Coenzyme Q10 300 mg in AM Vitamin B2- 200 mg twice a day   Add 1 supplement at a time since even natural supplements can have undesirable side effects. You can sometimes buy supplements cheaper (especially Coenzyme Q10) at www.webmailguide.co.za or at [facility].  Migraine with aura: There is increased risk for stroke in women with migraine with aura and a contraindication for the combined contraceptive pill for use by women who have migraine with aura. The risk for women with migraine without aura is lower. However other risk factors like smoking are far more likely to increase stroke risk than migraine. There is a recommendation for no smoking and for the use of OCPs without estrogen such as progestogen only pills particularly for women with migraine with aura.SABRA People who have migraine headaches with auras may be 3 times more likely to have a stroke caused by a blood clot, compared to migraine patients who don't see auras. Women who take hormone-replacement therapy may be 30 percent more likely to suffer a clot-based stroke than women not taking medication containing estrogen. Other risk factors like smoking and high blood pressure may be  much more important.    Vitamins and herbs that show  potential:   Magnesium: Magnesium (250 mg twice a day or 500 mg at bed) has a relaxant effect on smooth muscles such as blood vessels. Individuals suffering from frequent or daily headache usually have low magnesium levels which can be increase with daily supplementation of 400-750 mg. Three trials found 40-90% average headache reduction  when used as a preventative. Magnesium may help with headaches are aura, the best evidence for magnesium is for migraine with aura is its thought to stop the cortical spreading depression we believe is the pathophysiology of migraine aura.Magnesium also demonstrated the benefit in menstrually related migraine.  Magnesium is part of the messenger system in the serotonin cascade and it is a good muscle relaxant.  It is also useful for constipation which can be a side effect of other medications used to treat migraine. Good sources include nuts, whole grains, and tomatoes. Side Effects: loose stool/diarrhea  Riboflavin (vitamin B 2) 200 mg twice a day. This vitamin assists nerve cells in the production of ATP a principal energy storing molecule.  It is necessary for many chemical reactions in the body.  There have been at least 3 clinical trials of riboflavin using 400 mg per day all of which suggested that migraine frequency can be decreased.  All 3 trials showed significant improvement in over half of migraine sufferers.  The supplement is found in bread, cereal, milk, meat, and poultry.  Most Americans get more riboflavin than the recommended daily allowance, however riboflavin deficiency is not necessary for the supplements to help prevent headache. Side  effects: energizing, green urine   Coenzyme Q10: This is present in almost all cells in the body and is critical component for the conversion of energy.  Recent studies have shown that a nutritional supplement of CoQ10 can reduce the frequency of migraine attacks by improving the energy production of cells as with  riboflavin.  Doses of 150 mg twice a day have been shown to be effective.   Melatonin: Increasing evidence shows correlation between melatonin secretion and headache conditions.  Melatonin supplementation has decreased headache intensity and duration.  It is widely used as a sleep aid.  Sleep is natures way of dealing with migraine.  A dose of 3 mg is recommended to start for headaches including cluster headache. Higher doses up to 15 mg has been reviewed for use in Cluster headache and have been used. The rationale behind using melatonin for cluster is that many theories regarding the cause of Cluster headache center around the disruption of the normal circadian rhythm in the brain.  This helps restore the normal circadian rhythm.   HEADACHE DIET: Foods and beverages which may trigger migraine Note that only 20% of headache patients are food sensitive. You will know if you are food sensitive if you get a headache consistently 20 minutes to 2 hours after eating a certain food. Only cut out a food if it causes headaches, otherwise you might remove foods you enjoy! What matters most for diet is to eat a well balanced healthy diet full of vegetables and low fat protein, and to not miss meals.   Chocolate, other sweets ALL cheeses except cottage and cream cheese Dairy products, yogurt, sour cream, ice cream Liver Meat extracts (Bovril, Marmite, meat tenderizers) Meats or fish which have undergone aging, fermenting, pickling or smoking. These include: Hotdogs,salami,Lox,sausage, mortadellas,smoked salmon, pepperoni, Pickled herring Pods of broad bean (English beans, Chinese pea pods, Italian (fava) beans, lima and navy beans Ripe avocado, ripe banana Yeast extracts or active yeast preparations such as Brewer's or Fleishman's (commercial bakes goods are permitted) Tomato based foods, pizza (lasagna, etc.)   MSG (monosodium glutamate) is disguised as many things; look for these common  aliases: Monopotassium glutamate Autolysed yeast Hydrolysed protein Sodium caseinate "flavorings" "all natural preservatives Nutrasweet   Avoid all other foods that convincingly provoke headaches.   Resources: The Dizzy Bluford Aid Your Headache Diet, migrainestrong.com  https://zamora-andrews.com/   Caffeine and Migraine For patients that have migraine, caffeine intake more than 3 days per week can lead to dependency and increased migraine frequency. I would recommend cutting back on your caffeine intake as best you can. The recommended amount of caffeine is 200-300 mg daily, although migraine patients may experience dependency at even lower doses. While you may notice an increase in headache temporarily, cutting back will be helpful for headaches in the long run. For more information on caffeine and migraine, visit: https://americanmigrainefoundation.org/resource-library/caffeine-and-migraine/   Headache Prevention Strategies:   1. Maintain a headache diary; learn to identify and avoid triggers.  - This can be a simple note where you log when you had a headache, associated symptoms, and medications used - There are several smartphone apps developed to help track migraines: Migraine Buddy, Migraine Monitor, Curelator N1-Headache App   Common triggers include: Emotional triggers: Emotional/Upset family or friends Emotional/Upset occupation Business reversal/success Anticipation anxiety Crisis-serious Post-crisis periodNew job/position   Physical triggers: Vacation Day Weekend Strenuous Exercise High Altitude Location New Move Menstrual Day Physical Illness Oversleep/Not enough sleep Weather changes Light: Photophobia or light sesnitivity treatment involves  a balance between desensitization and reduction in overly strong input. Use dark polarized glasses outside, but not inside. Avoid bright or fluorescent light, but do not dim  environment to the point that going into a normally lit room hurts. Consider FL-41 tint lenses, which reduce the most irritating wavelengths without blocking too much light.  These can be obtained at axonoptics.com or theraspecs.com Foods: see list above.   2. Limit use of acute treatments (over-the-counter medications, triptans, etc.) to no more than 2 days per week or 10 days per month to prevent medication overuse headache (rebound headache).     3. Follow a regular schedule (including weekends and holidays): Don't skip meals. Eat a balanced diet. 8 hours of sleep nightly. Minimize stress. Exercise 30 minutes per day. Being overweight is associated with a 5 times increased risk of chronic migraine. Keep well hydrated and drink 6-8 glasses of water per day.   4. Initiate non-pharmacologic measures at the earliest onset of your headache. Rest and quiet environment. Relax and reduce stress. Breathe2Relax is a free app that can instruct you on    some simple relaxtion and breathing techniques. Http://Dawnbuse.com is a    free website that provides teaching videos on relaxation.  Also, there are  many apps that   can be downloaded for "mindful" relaxation.  An app called YOGA NIDRA will help walk you through mindfulness. Another app called Calm can be downloaded to give you a structured mindfulness guide with daily reminders and skill development. Headspace for guided meditation Mindfulness Based Stress Reduction Online Course: www.palousemindfulness.com Cold compresses.   5. Don't wait!! Take the maximum allowable dosage of prescribed medication at the first sign of migraine.   6. Compliance:  Take prescribed medication regularly as directed and at the first sign of a migraine.   7. Communicate:  Call your physician when problems arise, especially if your headaches change, increase in frequency/severity, or become associated with neurological symptoms (weakness, numbness, slurred speech, etc.).  Proceed to emergency room if you experience new or worsening symptoms or symptoms do not resolve, if you have new neurologic symptoms or if headache is severe, or for any concerning symptom.   8. Headache/pain management therapies: Consider various complementary methods, including medication, behavioral therapy, psychological counselling, biofeedback, massage therapy, acupuncture, dry needling, and other modalities.  Such measures may reduce the need for medications. Counseling for pain management, where patients learn to function and ignore/minimize their pain, seems to work very well.   9. Recommend changing family's attention and focus away from patient's headaches. Instead, emphasize daily activities. If first question of day is 'How are your headaches/Do you have a headache today?', then patient will constantly think about headaches, thus making them worse. Goal is to re-direct attention away from headaches, toward daily activities and other distractions.   10. Helpful Websites: www.AmericanHeadacheSociety.org patenthood.ch www.headaches.org tightmarket.nl www.achenet.org

## 2024-11-17 NOTE — Progress Notes (Unsigned)
 No chief complaint on file.   HISTORY OF PRESENT ILLNESS:  11/17/24 ALL: Christine [name] returns for follow up for IIH. She was last seen 11/2023 and continued to focus on weight management. Since,   11/19/2023 ALL:  Christine [name] returns for follow up for IIH. She was last seen 11/2022 and wished to continue working on healthy weight management. Since, she reports doing well. No headaches. No vision changes. She had last eye exam about 3 months ago and reports no concerns of edema.   She has had more trouble with back pain and has been limited with activity. She has gained back about 20-30lbs.  BP usually 130s/80s. Seems to run higher when at a doctor visit. She was taken off diltiazem  and started on metoprolol . She is followed closely by PCP.   12/04/2022 ALL: Christine [name] returns for follow up for IIH. She was last seen 07/2022 and hesitant to start Diamox for IIH. She opted to work on [employer]. Since, she reports doing fairly well from an IIH/headache standpoint. She denies any concerns of headaches. She has been sick with respiratory illness for the past three months. She just finished prednisone . She has gained about 15lbs. She reports BP is usually well managed but she is having flare of chronic back pain, today. She is followed closely by cardiology and recently started on flecainide  for afib. She also takes Cardizem . She was diagnosed with OSA 09/2021 but is having trouble getting CPAP machine. She reports cardiology is helping her with this. Using [employer] for DME. She is trying to eat more protein and less carbs.   Eye exam reportedly normal in 08/2022. She has follow up 08/2023. No vision changes. No vision loss.   07/30/2022 ALL: Christine [name] is a 58 y.o. female here today for follow up for IIH. She had an MRI 06/2022 showing concerns for papilledema. She was sent for LP showing opening pressure of 29. Diamox advised but she is hesitant. She has previous taken topiramate and did not  like side effects. She reports tingling in extremities and fogginess. She is very hesitant to taking medicaitons. She has a sulfa allergy. She had severe hives after taking sulfa based abx for strep throat about 30 years ago. She reports headaches are significantly improved. She does have a headache, today, but correlates this to recent diverticulitis flare. She reports being dehydrated from vomiting. She is working on [employer]. She was able to lose 115 pounds when previously eating a Keto based diet. She has a hard time tolerating foods due to diverticulitis. She denies vision changes. She has an appt with Dr Debarah this week.     HISTORY (copied from Dr Sharion previous note)  HPI:  Christine [name] is a 58 y.o. female here as requested by Barbra Odor, NP for new onset headache.  Past medical history pseudotumor cerebri, fibromyalgia, A-fib, POTS, OSA, obesity, IBS, migraines.  I reviewed handwritten notes from Odor Barbra, last seen May 24, 2022, headaches for 2 weeks, cannot work, her eyes are twitching, blood pressure at home has been 108 over the 50s to 60s, takes magnesium gluconate daily, fibromyalgia worse with blood pressure medications, on Eliquis  for A-fib, has been stressed at work.   She has had headaches in the past, this is not similar, she has migraines, these headaches are different. They started 3 weeks ago. They feel consistent headache, in the forehead from temple to temple. Her blood pressure has been lower. She was started on prednisone  and  had side effects so she had to stop. Nothing happened when the headache started, she was diagnosed with sleep apnea but has not treated it with either oral device or cpap. She was ordered a cpap. Consistent headache. Throughout the day. Laying down helps. Positional, worse standing, she have noticed some minimal vision changes, not exertional, vision has been blurrier than normal, she is due to get an eye exam in one month. No  pulsating/pounding/throbbing or other significant migrainous featShe denies any stress, any focal neurologic symptoms. No jaw pain or other signs/symptoms of GCA and esr/crp normal. No other focal neurologic deficits, associated symptoms, inciting events or modifiable factors.   Reviewed notes, labs and imaging from outside physicians, which showed:   Reviewed blood work May 25, 2022, CBC normal, CMP unremarkable with creatinine 1.05 and BUN 17, TSH normal, sed rate 37 normal, CRP 6 normal   MRI brain 04/02/2021: IMPRESSION:  No intrinsic pathology of the orbits or globes.  Question slight  prominence of cerebrospinal fluid in the optic nerve sheaths.  This  can be seen with increased intracranial pressure/pseudotumor.  The  finding is not definitely pathologic.    REVIEW OF SYSTEMS: Out of a complete 14 system review of symptoms, the patient complains only of the following symptoms, headaches, abdominal pain, muscle and joint pain and all other reviewed systems are negative.   ALLERGIES: Allergies  Allergen Reactions   Fexofenadine     Other Reaction(s): back pain   Other     an IBS med   Penicillins Hives   Sulfa Antibiotics Hives     HOME MEDICATIONS: Outpatient Medications Prior to Visit  Medication Sig Dispense Refill   apixaban  (ELIQUIS ) 5 MG TABS tablet Take 1 tablet (5 mg total) by mouth 2 (two) times daily. OVERDUE for follow-up labwork, MUST have labs done for FUTURE refills. 60 tablet 0   baclofen  (LIORESAL ) 10 MG tablet TAKE ONE TABLET BY MOUTH AT BEDTIME AS NEEDED FOR MUSCLE SPASMS 30 tablet 0   flecainide  (TAMBOCOR ) 50 MG tablet TAKE ONE TABLET BY MOUTH TWICE A DAY 180 tablet 1   GARLIC PO Take by mouth.     hyoscyamine (LEVSIN SL) 0.125 MG SL tablet Place 0.125 mg under the tongue as needed for cramping.     l-methylfolate-B6-B12 (METANX) 3-35-2 MG TABS tablet Take 1 tablet by mouth daily.     levalbuterol  (XOPENEX  HFA) 45 MCG/ACT inhaler Inhale 2 puffs into the  lungs 4 (four) times daily. 1 each 12   MAGNESIUM PO Take 1 tablet by mouth at bedtime.     methocarbamol  (ROBAXIN ) 500 MG tablet TAKE ONE TABLET BY MOUTH TWICE A DAY AS NEEDED FOR MUSCLE SPASMS 60 tablet 2   metoprolol  succinate (TOPROL -XL) 25 MG 24 hr tablet TAKE ONE-HALF TABLET BY MOUTH ONE TIME DAILY 30 tablet 6   Multiple Vitamin (MULTIVITAMIN PO) Take by mouth daily. (Patient taking differently: Take 1 tablet by mouth daily.)     NON FORMULARY Micro Ingredients Multi     pantoprazole (PROTONIX) 40 MG tablet Take 40 mg by mouth every other day.     rizatriptan  (MAXALT ) 10 MG tablet Take 10 mg by mouth as needed for migraine. (Patient not taking: Reported on 10/19/2024)     Vitamin D-Vitamin K (VITAMIN K2-VITAMIN D3 PO) Take by mouth daily. (Patient taking differently: Take 1 tablet by mouth daily.)     zolpidem  (AMBIEN ) 5 MG tablet TAKE ONE TABLET BY MOUTH AT BEDTIME AS NEEDED FOR SLEEP 30 tablet  0   No facility-administered medications prior to visit.     PAST MEDICAL HISTORY: Past Medical History:  Diagnosis Date   A-fib (HCC)    Allergy    Diverticulitis    Fibromyalgia    POTS (postural orthostatic tachycardia syndrome)    per patient     PAST SURGICAL HISTORY: Past Surgical History:  Procedure Laterality Date   ABDOMINAL HYSTERECTOMY     CESAREAN SECTION     COLONOSCOPY  01/09/2023   FINGER SURGERY     left ring finger x 2   FRACTURE SURGERY     TUBAL LIGATION     UPPER GI ENDOSCOPY  01/09/2023     FAMILY HISTORY: Family History  Problem Relation Age of Onset   Asthma Mother    Heart Problems Father    Heart disease Brother    Scoliosis Son    Migraines Neg Hx    Headache Neg Hx      SOCIAL HISTORY: Social History   Socioeconomic History   Marital status: Married    Spouse name: Not on file   Number of children: Not on file   Years of education: Not on file   Highest education level: Not on file  Occupational History   Occupation: Chartered Certified Accountant: [employer]  Tobacco Use   Smoking status: Former    Current packs/day: 0.00    Average packs/day: 0.3 packs/day for 2.0 years (0.5 ttl pk-yrs)    Types: Cigarettes    Start date: 02/21/1986    Quit date: 02/22/1988    Years since quitting: 36.7    Passive exposure: Past   Smokeless tobacco: Never   Tobacco comments:    Former smoker 09/04/23  Vaping Use   Vaping status: Never Used  Substance and Sexual Activity   Alcohol use: Yes    Comment: occ   Drug use: No   Sexual activity: Yes    Birth control/protection: Surgical  Other Topics Concern   Not on file  Social History Narrative   Not on file   Social Drivers of Health   Financial Resource Strain: Not on file  Food Insecurity: Not on file  Transportation Needs: Not on file  Physical Activity: Not on file  Stress: Not on file  Social Connections: Unknown (08/30/2022)   Received from [facility]   Social Network    Social Network: Not on file  Intimate Partner Violence: Unknown (08/30/2022)   Received from [facility]   HITS    Physically Hurt: Not on file    Insult or Talk Down To: Not on file    Threaten Physical Harm: Not on file    Scream or Curse: Not on file     PHYSICAL EXAM  There were no vitals filed for this visit.    There is no height or weight on file to calculate BMI.  Generalized: Well developed, in no acute distress  Cardiology: normal rate and rhythm, no murmur auscultated  Respiratory: clear to auscultation bilaterally    Neurological examination  Mentation: Alert oriented to time, place, history taking. Follows all commands speech and language fluent Cranial nerve II-XII: Pupils were equal round reactive to light. Extraocular movements were full, visual field were full on confrontational test. Facial sensation and strength were normal. Head turning and shoulder shrug  were normal and symmetric. Motor: The motor testing reveals 5 over 5 strength of all 4 extremities.  Good symmetric motor tone is noted throughout.  Gait and station: Gait is normal.    DIAGNOSTIC DATA (LABS, IMAGING, TESTING) - I reviewed patient records, labs, notes, testing and imaging myself where available.  Lab Results  Component Value Date   WBC 7.0 05/19/2021   HGB 15.8 (H) 05/19/2021   HCT 48.0 (H) 05/19/2021   MCV 89.7 05/19/2021   PLT 368 05/19/2021      Component Value Date/Time   NA 140 05/19/2021 1947   K 4.3 05/19/2021 1947   CL 103 05/19/2021 1947   CO2 25 05/19/2021 1947   GLUCOSE 98 05/19/2021 1947   BUN 7 05/19/2021 1947   CREATININE 0.82 05/19/2021 1947   CALCIUM 10.0 05/19/2021 1947   PROT 7.3 05/19/2021 1947   ALBUMIN 4.0 05/19/2021 1947   AST 28 05/19/2021 1947   ALT 29 05/19/2021 1947   ALKPHOS 59 05/19/2021 1947   BILITOT 1.1 05/19/2021 1947   GFRNONAA >60 05/19/2021 1947   GFRAA >60 02/15/2020 1631   No results found for: CHOL, HDL, LDLCALC, LDLDIRECT, TRIG, CHOLHDL No results found for: YHAJ8R No results found for: VITAMINB12 No results found for: TSH      No data to display               No data to display           ASSESSMENT AND PLAN  58 y.o. year old female  has a past medical history of A-fib (HCC), Allergy, Diverticulitis, Fibromyalgia, and POTS (postural orthostatic tachycardia syndrome). here with    No diagnosis found.  Christine [name] reports headaches are nearly resolved. No vision changes or concerns of vision loss. She has an allergy to sulfa and prefers to avoid acetazolamide. She previously failed topiramate. She would like to continue weight management plans. I have provided her additional information in her AVS. Weight management encouraged. Close follow up with ophthalmology advised. Healthy lifestyle habits encouraged. She will follow up with PCP as directed. She will return to see me in 1 year, sooner if needed. She verbalizes understanding and agreement with this plan.    No orders  of the defined types were placed in this encounter.    No orders of the defined types were placed in this encounter.    Greig Forbes, MSN, FNP-C 11/17/2024, 8:54 AM  [facility] [address], [address] [place], [place] [address] [phone]

## 2024-11-18 ENCOUNTER — Encounter: Payer: Self-pay | Admitting: Family Medicine

## 2024-11-18 ENCOUNTER — Ambulatory Visit: Payer: 59 | Admitting: Family Medicine

## 2024-11-18 VITALS — BP 157/86

## 2024-11-18 DIAGNOSIS — G932 Benign intracranial hypertension: Secondary | ICD-10-CM | POA: Diagnosis not present

## 2024-11-18 MED ORDER — RIZATRIPTAN BENZOATE 10 MG PO TABS: 10.0000 mg | ORAL_TABLET | ORAL | 5 refills | Status: DC | PRN

## 2024-11-23 ENCOUNTER — Telehealth: Payer: Self-pay | Admitting: Family Medicine

## 2024-11-23 NOTE — Telephone Encounter (Signed)
 At 10:20 Christine [name] @[email or web address]  #[id number]  left a vm asking for a call back re: the rizatriptan  (MAXALT ) 10 MG tablet.  He needs to know how many migraines pt has a month and how many tablets max per migraine, please call.

## 2024-11-24 MED ORDER — RIZATRIPTAN BENZOATE 10 MG PO TABS: 10.0000 mg | ORAL_TABLET | ORAL | 5 refills | Status: AC | PRN

## 2024-11-24 NOTE — Addendum Note (Signed)
 Addended by: CARY NO L on: 11/24/2024 12:46 PM   Modules accepted: Orders

## 2024-12-18 ENCOUNTER — Other Ambulatory Visit: Payer: Self-pay | Admitting: Physician Assistant

## 2024-12-20 NOTE — Telephone Encounter (Signed)
 Last Fill: 11/08/2024   Next Visit: 04/20/2025   Last Visit: 10/19/2024   Dx: Fibromyalgia    Current Dose per office note on 10/19/2024: baclofen  10 mg at bedtime as needed for muscle spasms.    Okay to refill Baclofen ?

## 2025-01-03 ENCOUNTER — Other Ambulatory Visit (HOSPITAL_COMMUNITY): Payer: Self-pay | Admitting: Physician Assistant

## 2025-01-19 ENCOUNTER — Ambulatory Visit: Admitting: Cardiology

## 2025-04-20 ENCOUNTER — Ambulatory Visit: Admitting: Rheumatology

## 2025-05-23 ENCOUNTER — Ambulatory Visit: Admitting: Cardiology

## 2025-12-05 ENCOUNTER — Ambulatory Visit: Admitting: Family Medicine
